# Patient Record
Sex: Male | Born: 1945 | Race: White | Hispanic: No | Marital: Married | Smoking: Never smoker
Health system: Southern US, Community
[De-identification: ages and names within clinical notes are randomized; demographics above are authoritative.]

## PROBLEM LIST (undated history)

## (undated) DIAGNOSIS — Z7189 Other specified counseling: Secondary | ICD-10-CM

## (undated) DIAGNOSIS — Z5111 Encounter for antineoplastic chemotherapy: Secondary | ICD-10-CM

## (undated) DIAGNOSIS — H409 Unspecified glaucoma: Secondary | ICD-10-CM

## (undated) DIAGNOSIS — R634 Abnormal weight loss: Secondary | ICD-10-CM

## (undated) DIAGNOSIS — N5089 Other specified disorders of the male genital organs: Secondary | ICD-10-CM

## (undated) DIAGNOSIS — G893 Neoplasm related pain (acute) (chronic): Secondary | ICD-10-CM

## (undated) DIAGNOSIS — C7951 Secondary malignant neoplasm of bone: Secondary | ICD-10-CM

## (undated) DIAGNOSIS — C3491 Malignant neoplasm of unspecified part of right bronchus or lung: Secondary | ICD-10-CM

## (undated) HISTORY — DX: Other specified counseling: Z71.89

## (undated) HISTORY — DX: Neoplasm related pain (acute) (chronic): G89.3

## (undated) HISTORY — DX: Malignant neoplasm of unspecified part of right bronchus or lung: C34.91

## (undated) HISTORY — DX: Secondary malignant neoplasm of bone: C79.51

## (undated) HISTORY — DX: Abnormal weight loss: R63.4

## (undated) HISTORY — PX: OTHER SURGICAL HISTORY: SHX169

## (undated) HISTORY — DX: Encounter for antineoplastic chemotherapy: Z51.11

---

## 1982-06-07 HISTORY — PX: HERNIA REPAIR: SHX51

## 1999-10-12 ENCOUNTER — Ambulatory Visit (HOSPITAL_COMMUNITY): Admission: RE | Admit: 1999-10-12 | Discharge: 1999-10-12 | Payer: Self-pay | Admitting: Family Medicine

## 1999-10-12 ENCOUNTER — Encounter: Payer: Self-pay | Admitting: Family Medicine

## 2003-11-14 ENCOUNTER — Ambulatory Visit (HOSPITAL_COMMUNITY): Admission: RE | Admit: 2003-11-14 | Discharge: 2003-11-14 | Payer: Self-pay | Admitting: Family Medicine

## 2014-05-17 ENCOUNTER — Ambulatory Visit: Payer: Medicare Other | Admitting: *Deleted

## 2014-05-17 DIAGNOSIS — R55 Syncope and collapse: Secondary | ICD-10-CM

## 2014-05-20 NOTE — Progress Notes (Signed)
30 day Event monitor place 05/17/14

## 2014-06-17 ENCOUNTER — Telehealth: Payer: Self-pay | Admitting: Cardiovascular Disease

## 2014-06-17 NOTE — Telephone Encounter (Signed)
Patient was referred here for 30 day event monitor.  Patient got monitor on December 11 and turned it in on December 26 because his insurance was not going to pay for it.  Dr. Doreene Adas office is calling to see if we got any results.  Patient has never been seen in our office.Marland KitchenMarland Kitchen

## 2014-06-17 NOTE — Telephone Encounter (Signed)
Spoke w/ nurse requesting information. This patient was put on a 30-day event monitor. I see the report states "in process". I'm unable to access Muse software to read events or diagnostic information. Will route to [place].

## 2014-06-18 ENCOUNTER — Encounter: Payer: Self-pay | Admitting: Cardiology

## 2014-06-18 NOTE — Telephone Encounter (Signed)
Will have Dr. Martinique read the EOS and then fax to Dr. Kenton Kingfisher @ [phone].

## 2014-06-18 NOTE — Telephone Encounter (Signed)
EOS requested form [employer] - faxed over. Will have Dr. Martinique read the summary report and fax to Dr. Kenton Kingfisher @ [phone].

## 2014-06-24 NOTE — Telephone Encounter (Signed)
Dr.Jordan reviewed and signed end of summary [employer] monitor report.Report faxed to Dr.Harris at fax # [phone].

## 2016-05-09 ENCOUNTER — Encounter (HOSPITAL_BASED_OUTPATIENT_CLINIC_OR_DEPARTMENT_OTHER): Payer: Self-pay | Admitting: *Deleted

## 2016-05-09 ENCOUNTER — Emergency Department (HOSPITAL_BASED_OUTPATIENT_CLINIC_OR_DEPARTMENT_OTHER)
Admission: EM | Admit: 2016-05-09 | Discharge: 2016-05-09 | Disposition: A | Discharge status: Home / Self Care | Payer: Medicare Other | Attending: Emergency Medicine | Admitting: Emergency Medicine

## 2016-05-09 DIAGNOSIS — Z79899 Other long term (current) drug therapy: Secondary | ICD-10-CM | POA: Insufficient documentation

## 2016-05-09 DIAGNOSIS — Z5181 Encounter for therapeutic drug level monitoring: Secondary | ICD-10-CM | POA: Insufficient documentation

## 2016-05-09 DIAGNOSIS — R262 Difficulty in walking, not elsewhere classified: Secondary | ICD-10-CM | POA: Insufficient documentation

## 2016-05-09 DIAGNOSIS — R42 Dizziness and giddiness: Secondary | ICD-10-CM

## 2016-05-09 HISTORY — DX: Unspecified glaucoma: H40.9

## 2016-05-09 HISTORY — DX: Other specified disorders of the male genital organs: N50.89

## 2016-05-09 LAB — COMPREHENSIVE METABOLIC PANEL
ALT: 16 U/L — AB (ref 17–63)
AST: 26 U/L (ref 15–41)
Albumin: 3.8 g/dL (ref 3.5–5.0)
Alkaline Phosphatase: 71 U/L (ref 38–126)
Anion gap: 6 (ref 5–15)
BILIRUBIN TOTAL: 0.7 mg/dL (ref 0.3–1.2)
BUN: 21 mg/dL — AB (ref 6–20)
CHLORIDE: 110 mmol/L (ref 101–111)
CO2: 25 mmol/L (ref 22–32)
CREATININE: 1.15 mg/dL (ref 0.61–1.24)
Calcium: 9.7 mg/dL (ref 8.9–10.3)
Glucose, Bld: 138 mg/dL — ABNORMAL HIGH (ref 65–99)
POTASSIUM: 4.3 mmol/L (ref 3.5–5.1)
Sodium: 141 mmol/L (ref 135–145)
TOTAL PROTEIN: 6.4 g/dL — AB (ref 6.5–8.1)

## 2016-05-09 LAB — CBC
HEMATOCRIT: 45.3 % (ref 39.0–52.0)
HEMOGLOBIN: 15.4 g/dL (ref 13.0–17.0)
MCH: 31.8 pg (ref 26.0–34.0)
MCHC: 34 g/dL (ref 30.0–36.0)
MCV: 93.6 fL (ref 78.0–100.0)
Platelets: 295 10*3/uL (ref 150–400)
RBC: 4.84 MIL/uL (ref 4.22–5.81)
RDW: 12.8 % (ref 11.5–15.5)
WBC: 12.8 10*3/uL — ABNORMAL HIGH (ref 4.0–10.5)

## 2016-05-09 LAB — RAPID URINE DRUG SCREEN, HOSP PERFORMED
Amphetamines: NOT DETECTED
Barbiturates: NOT DETECTED
Benzodiazepines: NOT DETECTED
COCAINE: NOT DETECTED
OPIATES: NOT DETECTED
Tetrahydrocannabinol: NOT DETECTED

## 2016-05-09 LAB — URINE MICROSCOPIC-ADD ON

## 2016-05-09 LAB — URINALYSIS, ROUTINE W REFLEX MICROSCOPIC
Bilirubin Urine: NEGATIVE
GLUCOSE, UA: NEGATIVE mg/dL
Hgb urine dipstick: NEGATIVE
Ketones, ur: 15 mg/dL — AB
NITRITE: NEGATIVE
PH: 7 (ref 5.0–8.0)
Protein, ur: NEGATIVE mg/dL
SPECIFIC GRAVITY, URINE: 1.017 (ref 1.005–1.030)

## 2016-05-09 LAB — PROTIME-INR
INR: 0.95
Prothrombin Time: 12.7 seconds (ref 11.4–15.2)

## 2016-05-09 LAB — DIFFERENTIAL
BASOS ABS: 0 10*3/uL (ref 0.0–0.1)
BASOS PCT: 0 %
EOS ABS: 0 10*3/uL (ref 0.0–0.7)
Eosinophils Relative: 0 %
LYMPHS ABS: 0.6 10*3/uL — AB (ref 0.7–4.0)
Lymphocytes Relative: 4 %
MONO ABS: 0.6 10*3/uL (ref 0.1–1.0)
MONOS PCT: 5 %
Neutro Abs: 11.6 10*3/uL — ABNORMAL HIGH (ref 1.7–7.7)
Neutrophils Relative %: 91 %

## 2016-05-09 LAB — TROPONIN I: Troponin I: <0.03 ng/mL (ref <0.03)

## 2016-05-09 LAB — ETHANOL

## 2016-05-09 LAB — APTT: aPTT: 29 seconds (ref 24–36)

## 2016-05-09 MED ORDER — DIAZEPAM 5 MG PO TABS: 5.0000 mg | ORAL_TABLET | Freq: Two times a day (BID) | ORAL | 0 refills | Status: DC

## 2016-05-09 MED ORDER — DIAZEPAM 5 MG/ML IJ SOLN
5.0000 mg | Freq: Once | INTRAMUSCULAR | Status: AC
  Administered 2016-05-09: 5 mg via INTRAVENOUS
  Filled 2016-05-09: qty 2

## 2016-05-09 NOTE — ED Triage Notes (Signed)
per wife, Warren Odonnell started experiencing dizziness around 3 pm yesterday. He took a dramamine around 9:30 pm which helped him sleep, but did not relieve the dizziness. He states that the room is still spinning around.

## 2016-05-09 NOTE — ED Notes (Signed)
Pt ambulated approx 559f in hallway. Denys any dizziness. States he feels weak, but has not eaten anything. Towards the end of ambulating, he began to lose his balance and stumbled. In NAD. PA made aware. MD to bedside.

## 2016-05-09 NOTE — Discharge Instructions (Signed)
Start taking Valium twice daily for dizziness and nausea. If your symptoms return or become worse please go to [facility] for evaluation with MRI and neurology.

## 2016-05-09 NOTE — ED Provider Notes (Signed)
[facility] Provider Note   CSN: [id number] Arrival date & time: 05/09/16  1010     History   Chief Complaint Chief Complaint  Patient presents with  . Dizziness    HPI Warren Odonnell is a 70 y.o. male.  HPI Patient with past medical history significant for glaucoma presents with gradual onset, constant, unchanging dizziness he describes as the room spinning. States this began about 1 PM yesterday and has been persistent. He cannot think of specific triggers such as head movement or standing. This is never happened before. He denies any recent illness. He denies any headache, neck pain, numbness, weakness, slurred speech, facial droop, chest pain, shortness of breath. He states that he is having to hold onto things as he walks. He took scopolamine without relief. He states he does go to a chiropractor for neck and back pain with the most recent visit being 3 days ago.  Past Medical History:  Diagnosis Date  . Glaucoma   . Testicular swelling     There are no active problems to display for this patient.   Past Surgical History:  Procedure Laterality Date  . HERNIA REPAIR  1984  . testicular torsion Left        Home Medications    Prior to Admission medications   Medication Sig Start Date End Date Taking? Authorizing Provider  MELOXICAM PO Take by mouth.   Yes Historical Provider, MD  Multiple Vitamin (MULTIVITAMIN) tablet Take 1 tablet by mouth daily.   Yes Historical Provider, MD  diazepam (VALIUM) 5 MG tablet Take 1 tablet (5 mg total) by mouth 2 (two) times daily. 05/09/16   Gloriann Loan, PA-C    Family History No family history on file.  Social History Social History  Substance Use Topics  . Smoking status: Not on file  . Smokeless tobacco: Not on file  . Alcohol use Not on file     Allergies   Patient has no allergy information on record.   Review of Systems Review of Systems All other systems negative unless otherwise stated in  HPI   Physical Exam Updated Vital Signs BP 145/77   Pulse 70   Temp 98.1 F (36.7 C) (Oral)   Resp 15   Ht '5\' 11"'$  (1.803 m)   Wt 81.6 kg   SpO2 97%   BMI 25.10 kg/m   Physical Exam  Constitutional: He is oriented to person, place, and time. He appears well-developed and well-nourished.  Non-toxic appearance. He does not have a sickly appearance. He does not appear ill.  HENT:  Head: Normocephalic and atraumatic.  Mouth/Throat: Oropharynx is clear and moist.  Nystagmus.   Eyes: Conjunctivae are normal. Pupils are equal, round, and reactive to light.  Neck: Normal range of motion. Neck supple.  Cardiovascular: Normal rate and regular rhythm.   Pulmonary/Chest: Effort normal and breath sounds normal. No accessory muscle usage or stridor. No respiratory distress. He has no wheezes. He has no rhonchi. He has no rales.  Abdominal: Soft. Bowel sounds are normal. He exhibits no distension. There is no tenderness.  Musculoskeletal: Normal range of motion.  Lymphadenopathy:    He has no cervical adenopathy.  Neurological: He is alert and oriented to person, place, and time.  Mental Status:   AOx3.  Speech clear without dysarthria. Cranial Nerves:  I-not tested  II-PERRLA  III, IV, VI-EOMs intact  V-temporal and masseter strength intact  VII-symmetrical facial movements intact, no facial droop  VIII-hearing grossly intact bilaterally  IX, X-gag intact  XI-strength of sternomastoid and trapezius muscles 5/5  XII-tongue midline Motor:   Good muscle bulk and tone  Strength 5/5 bilaterally in upper and lower extremities   Cerebellar--intact RAMs, finger to nose intact bilaterally.    No pronator drift Sensory:  Intact in upper and lower extremities   Skin: Skin is warm and dry.  Psychiatric: He has a normal mood and affect. His behavior is normal.     ED Treatments / Results  Labs (all labs ordered are listed, but only abnormal results are displayed) Labs Reviewed  CBC -  Abnormal; Notable for the following:       Result Value   WBC 12.8 (*)    All other components within normal limits  DIFFERENTIAL - Abnormal; Notable for the following:    Neutro Abs 11.6 (*)    Lymphs Abs 0.6 (*)    All other components within normal limits  COMPREHENSIVE METABOLIC PANEL - Abnormal; Notable for the following:    Glucose, Bld 138 (*)    BUN 21 (*)    Total Protein 6.4 (*)    ALT 16 (*)    All other components within normal limits  URINALYSIS, ROUTINE W REFLEX MICROSCOPIC (NOT AT [facility]) - Abnormal; Notable for the following:    Ketones, ur 15 (*)    Leukocytes, UA TRACE (*)    All other components within normal limits  URINE MICROSCOPIC-ADD ON - Abnormal; Notable for the following:    Squamous Epithelial / LPF 0-5 (*)    Bacteria, UA FEW (*)    Casts GRANULAR CAST (*)    All other components within normal limits  ETHANOL  PROTIME-INR  APTT  RAPID URINE DRUG SCREEN, HOSP PERFORMED  TROPONIN I    EKG  EKG Interpretation  Date/Time:  Sunday May 09 2016 10:36:43 EST Ventricular Rate:  70 PR Interval:    QRS Duration: 96 QT Interval:  413 QTC Calculation: 446 R Axis:   -11 Text Interpretation:  Sinus rhythm RSR' in V1 or V2, probably normal variant Baseline wander in lead(s) V3 Confirmed by HAVILAND MD, JULIE ([id number]) on 05/09/2016 10:38:47 AM       Radiology No results found.  Procedures Procedures (including critical care time)  Medications Ordered in ED Medications  diazepam (VALIUM) injection 5 mg (5 mg Intravenous Given 05/09/16 1123)     Initial Impression / Assessment and Plan / ED Course  I have reviewed the triage vital signs and the nursing notes.  Pertinent labs & imaging results that were available during my care of the patient were reviewed by me and considered in my medical decision making (see chart for details).  Clinical Course    Patient presents with gradual onset, constant, unchanging dizziness he describes as the room  spinning. He is difficulty walking. No other neurologic symptoms. On exam he does have nystagmus. Otherwise, no neurologic findings. Gait not tested at this time. Concern for posterior stroke versus peripheral vertigo. We'll treat with Valium and reassess. Labs without acute abnormalities.  Patient reports improvement of his symptoms and able to ambulate in ED.  At this time, I have low suspicion for posterior stroke given improvement of symptoms.  Patient seen by Dr. Gilford Raid as well.  Discussed outpatient MRI vs transfer [facility] for continued work up.  Patient would like to go home and have outpatient MRI, this has been scheduled.  This is reasonable given low suspicion.  Discharge home with #8 Valium. Follow up PCP.  Strict return precautions including worsening dizziness, nausea, vomiting, headache, or any new or concerning symptoms.  Patient instructed to go to [facility] as they have MRI available today.  Patient agrees and acknowledges the above plan for discharge.  All questions answered.  Stable for discharge.   Final Clinical Impressions(s) / ED Diagnoses   Final diagnoses:  Dizziness    New Prescriptions New Prescriptions   DIAZEPAM (VALIUM) 5 MG TABLET    Take 1 tablet (5 mg total) by mouth 2 (two) times daily.         Gloriann Loan, PA-C 05/09/16 1246    Isla Pence, MD 05/09/16 1434

## 2016-05-13 ENCOUNTER — Ambulatory Visit (HOSPITAL_BASED_OUTPATIENT_CLINIC_OR_DEPARTMENT_OTHER)
Admission: RE | Admit: 2016-05-13 | Discharge: 2016-05-13 | Disposition: A | Discharge status: Home / Self Care | Payer: Medicare Other | Source: Ambulatory Visit | Attending: Emergency Medicine | Admitting: Emergency Medicine

## 2016-05-13 DIAGNOSIS — R42 Dizziness and giddiness: Secondary | ICD-10-CM | POA: Insufficient documentation

## 2016-06-03 ENCOUNTER — Encounter (INDEPENDENT_AMBULATORY_CARE_PROVIDER_SITE_OTHER): Payer: Self-pay | Admitting: Orthopaedic Surgery

## 2016-06-03 ENCOUNTER — Ambulatory Visit (INDEPENDENT_AMBULATORY_CARE_PROVIDER_SITE_OTHER): Payer: Medicare Other

## 2016-06-03 ENCOUNTER — Ambulatory Visit (INDEPENDENT_AMBULATORY_CARE_PROVIDER_SITE_OTHER): Payer: Medicare Other | Admitting: Orthopaedic Surgery

## 2016-06-03 VITALS — Ht 71.0 in | Wt 180.0 lb

## 2016-06-03 DIAGNOSIS — M545 Low back pain: Secondary | ICD-10-CM | POA: Diagnosis not present

## 2016-06-03 DIAGNOSIS — G8929 Other chronic pain: Secondary | ICD-10-CM

## 2016-06-03 DIAGNOSIS — M542 Cervicalgia: Secondary | ICD-10-CM

## 2016-06-03 NOTE — Progress Notes (Signed)
   Office Visit Note   Patient: Warren Odonnell           Date of Birth: Apr 30, 1946           MRN: [id number] Visit Date: 06/03/2016              Requested by: Shirline Frees, MD [place] [place], [place] [address] PCP: Shirline Frees, MD   Assessment & Plan: Visit Diagnoses: Neck and lumbar spine pain. Appears to be a combination of mild muscle spasm and degenerative changes. There is no evidence of radicular pain or fracture.  Plan: Over-the-counter meds, physical therapy. Follow up in the next 3-4 weeks  Follow-Up Instructions: No Follow-up on file.   Orders:  No orders of the defined types were placed in this encounter.  No orders of the defined types were placed in this encounter.     Procedures: No procedures performed   Clinical Data: No additional findings.   Subjective: No chief complaint on file.   4 weeks ago pt had vertigo, went to [place] ED on 05/08/16.  Pt had light sensitivity, emesis and dizziness.Denies headache.  MRI brain 05/13/16, negative  BIL shoulders and neck pain. Also, pain in his lower back did not start until the vertigo and the vomiting.  Pt went to chiropractor for back pain from playing golf. 2 x a week, 10 visits. No relief.  Pt also had blood work done and it showed elevated white count.                 Warren Odonnell relates no radicular pain to either upper or lower extremity. Discomfort is localized to the cervical and lumbar spine. He denies any changes in bowel or bladder function. He has an appointment to see the ENT specialist tomorrow regarding his dizziness.  Review of Systems   Objective: Vital Signs: Ht '5\' 11"'$  (1.803 m)   Wt 180 lb (81.6 kg)   BMI 25.10 kg/m   Physical Exam  Ortho Exam mild limitation of motion of the cervical spine particularly in extension. There was no referred pain to either upper extremity or shoulder with Motion. Able to touch the chin to the chest. Limitation of  rotation to the right and the left related to posterior cervical pain.  Straight leg raise is negative bilaterally. Deep tendon reflexes are symmetrical. No percussible tenderness of lumbar spine. Painless range of motion of both hips and knees. No local tenderness about either hip.  Specialty Comments:  No specialty comments available.  Imaging: No results found.   PMFS History: There are no active problems to display for this patient.  Past Medical History:  Diagnosis Date  . Glaucoma   . Testicular swelling     No family history on file.  Past Surgical History:  Procedure Laterality Date  . HERNIA REPAIR  1984  . testicular torsion Left    Social History   Occupational History  . Not on file.   Social History Main Topics  . Smoking status: Not on file  . Smokeless tobacco: Not on file  . Alcohol use Not on file  . Drug use: Unknown  . Sexual activity: Not on file

## 2016-06-22 ENCOUNTER — Telehealth (INDEPENDENT_AMBULATORY_CARE_PROVIDER_SITE_OTHER): Payer: Self-pay | Admitting: Orthopaedic Surgery

## 2016-06-22 ENCOUNTER — Other Ambulatory Visit (INDEPENDENT_AMBULATORY_CARE_PROVIDER_SITE_OTHER): Payer: Self-pay

## 2016-06-22 DIAGNOSIS — G8929 Other chronic pain: Secondary | ICD-10-CM

## 2016-06-22 DIAGNOSIS — M545 Low back pain: Principal | ICD-10-CM

## 2016-06-22 MED ORDER — METHOCARBAMOL 500 MG PO TABS: 500.0000 mg | ORAL_TABLET | Freq: Two times a day (BID) | ORAL | 0 refills | Status: DC

## 2016-06-22 NOTE — Telephone Encounter (Signed)
Patient was seen in office for back pain; Dr. Tanda Rockers was hesitant to give him muscle relaxers because of his vertigo. Patient has been to an ENT and has been cleared of that problem. Patient is still having back pain and would like an rx for some muscle relaxers sent to [facility] at [facility] [address].

## 2016-06-22 NOTE — Telephone Encounter (Signed)
Please advise 

## 2016-06-22 NOTE — Telephone Encounter (Signed)
done

## 2016-06-22 NOTE — Telephone Encounter (Signed)
Robaxin '500mg'$  #30 1 tab po bid prn

## 2016-06-29 ENCOUNTER — Ambulatory Visit (INDEPENDENT_AMBULATORY_CARE_PROVIDER_SITE_OTHER): Payer: Medicare Other | Admitting: Orthopaedic Surgery

## 2016-06-29 ENCOUNTER — Encounter (INDEPENDENT_AMBULATORY_CARE_PROVIDER_SITE_OTHER): Payer: Self-pay | Admitting: Orthopaedic Surgery

## 2016-06-29 VITALS — BP 162/78 | HR 80 | Resp 14 | Ht 71.0 in | Wt 180.0 lb

## 2016-06-29 DIAGNOSIS — M545 Low back pain: Secondary | ICD-10-CM

## 2016-06-29 DIAGNOSIS — G8929 Other chronic pain: Secondary | ICD-10-CM | POA: Diagnosis not present

## 2016-06-29 MED ORDER — TRAMADOL HCL 50 MG PO TABS: 50.0000 mg | ORAL_TABLET | Freq: Three times a day (TID) | ORAL | 0 refills | Status: DC

## 2016-06-29 NOTE — Progress Notes (Signed)
   Office Visit Note   Patient: Warren Odonnell           Date of Birth: 05-Jun-1946           MRN: [id number] Visit Date: 06/29/2016              Requested by: Shirline Frees, MD [place], [place] [address] PCP: Shirline Frees, MD   Assessment & Plan: Visit Diagnoses: low back pain  Plan: MRI scan lumbar spine and follow up after scan. Tramadol for pain to supplement the muscle relaxant and NSAIDs  Follow-Up Instructions: No Follow-up on file.   Orders:  No orders of the defined types were placed in this encounter.  No orders of the defined types were placed in this encounter.     Procedures: No procedures performed   Clinical Data: No additional findings.   Subjective: No chief complaint on file.   Pt presents with Left sided lumbar pain that does not radiate into the hamstrings/quad area., has become increasingly worse in the last 2 weeks. Pt had a cold and did a lot of sneezing and coughing.  PT is not helping according to the pt. He wants some relief and some meds.  We did prescribe Robaxin on 06/22/16 but has had no relief from that med.  Warren Odonnell relates that he has not had any significant relief of his pain that originates in the lower lumbar spine and will radiate both right and left paralumbar regions. Denies any groin pain or thigh discomfort. He denies any numbness or tingling.  Review of Systems   Objective: Vital Signs: Ht '5\' 11"'$  (1.803 m)   Wt 180 lb (81.6 kg)   BMI 25.10 kg/m   Physical Exam  Ortho Exam straight leg raise is negative bilaterally. Painless range of motion of both hips and both knees. Neurovascular exam is intact. No swelling of either calf or thigh. Some mild percussible tenderness of the lumbar spine and to the left of the midline. No masses. Over the greater trochanters.  Specialty Comments:  No specialty comments available.  Imaging: No results found.   PMFS History: There are no active  problems to display for this patient.  Past Medical History:  Diagnosis Date  . Glaucoma   . Testicular swelling     No family history on file.  Past Surgical History:  Procedure Laterality Date  . HERNIA REPAIR  1984  . testicular torsion Left    Social History   Occupational History  . Not on file.   Social History Main Topics  . Smoking status: Never Smoker  . Smokeless tobacco: Never Used  . Alcohol use Not on file  . Drug use: No  . Sexual activity: Not Currently

## 2016-07-02 ENCOUNTER — Other Ambulatory Visit (INDEPENDENT_AMBULATORY_CARE_PROVIDER_SITE_OTHER): Payer: Self-pay

## 2016-07-02 ENCOUNTER — Telehealth (INDEPENDENT_AMBULATORY_CARE_PROVIDER_SITE_OTHER): Payer: Self-pay | Admitting: Orthopaedic Surgery

## 2016-07-02 DIAGNOSIS — G8929 Other chronic pain: Secondary | ICD-10-CM

## 2016-07-02 DIAGNOSIS — M545 Low back pain: Principal | ICD-10-CM

## 2016-07-02 MED ORDER — TRAMADOL HCL 50 MG PO TABS: ORAL_TABLET | ORAL | 0 refills | Status: DC

## 2016-07-02 NOTE — Telephone Encounter (Signed)
Please call.

## 2016-07-02 NOTE — Telephone Encounter (Signed)
Patient states Dr. Durward Fortes is supposed to call on Monday and is requesting he call him on his cell phone please

## 2016-07-02 NOTE — Telephone Encounter (Signed)
called

## 2016-07-03 ENCOUNTER — Ambulatory Visit (HOSPITAL_BASED_OUTPATIENT_CLINIC_OR_DEPARTMENT_OTHER)
Admission: RE | Admit: 2016-07-03 | Discharge: 2016-07-03 | Disposition: A | Discharge status: Home / Self Care | Payer: Medicare Other | Source: Ambulatory Visit | Attending: Orthopaedic Surgery | Admitting: Orthopaedic Surgery

## 2016-07-03 DIAGNOSIS — M5126 Other intervertebral disc displacement, lumbar region: Secondary | ICD-10-CM | POA: Diagnosis not present

## 2016-07-03 DIAGNOSIS — M1288 Other specific arthropathies, not elsewhere classified, other specified site: Secondary | ICD-10-CM | POA: Diagnosis not present

## 2016-07-03 DIAGNOSIS — M5136 Other intervertebral disc degeneration, lumbar region: Secondary | ICD-10-CM | POA: Diagnosis not present

## 2016-07-03 DIAGNOSIS — N281 Cyst of kidney, acquired: Secondary | ICD-10-CM | POA: Insufficient documentation

## 2016-07-03 DIAGNOSIS — G8929 Other chronic pain: Secondary | ICD-10-CM | POA: Diagnosis not present

## 2016-07-03 DIAGNOSIS — M47896 Other spondylosis, lumbar region: Secondary | ICD-10-CM | POA: Diagnosis not present

## 2016-07-03 DIAGNOSIS — M545 Low back pain: Secondary | ICD-10-CM | POA: Insufficient documentation

## 2016-07-03 DIAGNOSIS — M48061 Spinal stenosis, lumbar region without neurogenic claudication: Secondary | ICD-10-CM | POA: Insufficient documentation

## 2016-07-05 ENCOUNTER — Telehealth (INDEPENDENT_AMBULATORY_CARE_PROVIDER_SITE_OTHER): Payer: Self-pay

## 2016-07-05 NOTE — Telephone Encounter (Signed)
Received voicemail on Friday from pts wife stating pt needed appt this week for Mri  Review with Dr. Ernestina Patches (MRI 07/03/16) and needing to get set up for injection asap after that. I see where Dr. Durward Fortes said he called him on Friday. Are we supposed to be setting him for injection pending MRI results or are we supposed to be seeing him to go over the MRI and then setting him up?

## 2016-07-05 NOTE — Telephone Encounter (Signed)
See below

## 2016-07-05 NOTE — Telephone Encounter (Signed)
Called Dr Kenton Kingfisher regarding the MRI scan report-he will call Mr Ulibarri and obtain lab thru his office. Called Mr Davidian with the  results of the scan- no need to see Dr Ernestina Patches at this time

## 2016-07-05 NOTE — Telephone Encounter (Signed)
Thank you :)

## 2016-07-09 ENCOUNTER — Ambulatory Visit (INDEPENDENT_AMBULATORY_CARE_PROVIDER_SITE_OTHER): Payer: Medicare Other | Admitting: Orthopaedic Surgery

## 2016-07-14 ENCOUNTER — Other Ambulatory Visit: Payer: Self-pay | Admitting: Family Medicine

## 2016-07-14 DIAGNOSIS — M899 Disorder of bone, unspecified: Secondary | ICD-10-CM

## 2016-07-15 ENCOUNTER — Ambulatory Visit
Admission: RE | Admit: 2016-07-15 | Discharge: 2016-07-15 | Disposition: A | Discharge status: Home / Self Care | Payer: Medicare Other | Source: Ambulatory Visit | Attending: Family Medicine | Admitting: Family Medicine

## 2016-07-15 DIAGNOSIS — M899 Disorder of bone, unspecified: Secondary | ICD-10-CM

## 2016-07-15 MED ORDER — IOPAMIDOL (ISOVUE-300) INJECTION 61%
100.0000 mL | Freq: Once | INTRAVENOUS | Status: AC | PRN
  Administered 2016-07-15: 100 mL via INTRAVENOUS

## 2016-07-19 ENCOUNTER — Telehealth: Payer: Self-pay | Admitting: *Deleted

## 2016-07-19 DIAGNOSIS — R918 Other nonspecific abnormal finding of lung field: Secondary | ICD-10-CM

## 2016-07-19 NOTE — Telephone Encounter (Signed)
Oncology Nurse Navigator Documentation  Oncology Nurse Navigator Flowsheets 07/19/2016  Navigator Location [facility]  Referral date to RadOnc/MedOnc 07/19/2016  Navigator Encounter Type Telephone/I received referral on Warren Odonnell today.  I updated Dr. Julien Nordmann and he state he will see patient on Friday.  I called patient and update.  Patient will be seen on 07/23/16 arrive at 8:15. I called referring office to update them on appt. I was unable to reach but did leave a vm message   Telephone Outgoing Call  Treatment Phase Abnormal Scans  Barriers/Navigation Needs Coordination of Care  Interventions Coordination of Care  Coordination of Care Appts  Acuity Level 2  Acuity Level 2 Other  Time Spent with Patient 30

## 2016-07-23 ENCOUNTER — Encounter: Payer: Self-pay | Admitting: *Deleted

## 2016-07-23 ENCOUNTER — Telehealth: Payer: Self-pay | Admitting: *Deleted

## 2016-07-23 ENCOUNTER — Telehealth: Payer: Self-pay | Admitting: Internal Medicine

## 2016-07-23 ENCOUNTER — Ambulatory Visit (HOSPITAL_BASED_OUTPATIENT_CLINIC_OR_DEPARTMENT_OTHER): Payer: Medicare Other | Admitting: Internal Medicine

## 2016-07-23 ENCOUNTER — Encounter: Payer: Self-pay | Admitting: Internal Medicine

## 2016-07-23 ENCOUNTER — Other Ambulatory Visit: Payer: Self-pay | Admitting: Internal Medicine

## 2016-07-23 ENCOUNTER — Other Ambulatory Visit (HOSPITAL_BASED_OUTPATIENT_CLINIC_OR_DEPARTMENT_OTHER): Payer: Medicare Other

## 2016-07-23 VITALS — BP 170/85 | HR 104 | Temp 98.1°F | Resp 17 | Ht 71.0 in | Wt 171.2 lb

## 2016-07-23 DIAGNOSIS — R918 Other nonspecific abnormal finding of lung field: Secondary | ICD-10-CM

## 2016-07-23 DIAGNOSIS — C7951 Secondary malignant neoplasm of bone: Secondary | ICD-10-CM

## 2016-07-23 DIAGNOSIS — G893 Neoplasm related pain (acute) (chronic): Secondary | ICD-10-CM

## 2016-07-23 DIAGNOSIS — J90 Pleural effusion, not elsewhere classified: Secondary | ICD-10-CM

## 2016-07-23 DIAGNOSIS — R599 Enlarged lymph nodes, unspecified: Secondary | ICD-10-CM

## 2016-07-23 DIAGNOSIS — R110 Nausea: Secondary | ICD-10-CM

## 2016-07-23 DIAGNOSIS — R634 Abnormal weight loss: Secondary | ICD-10-CM | POA: Diagnosis not present

## 2016-07-23 HISTORY — DX: Secondary malignant neoplasm of bone: C79.51

## 2016-07-23 LAB — COMPREHENSIVE METABOLIC PANEL
ALT: 48 U/L (ref 0–55)
AST: 32 U/L (ref 5–34)
Albumin: 2.9 g/dL — ABNORMAL LOW (ref 3.5–5.0)
Alkaline Phosphatase: 121 U/L (ref 40–150)
Anion Gap: 11 mEq/L (ref 3–11)
BUN: 23 mg/dL (ref 7.0–26.0)
CHLORIDE: 105 meq/L (ref 98–109)
CO2: 22 meq/L (ref 22–29)
Calcium: 10.3 mg/dL (ref 8.4–10.4)
Creatinine: 1 mg/dL (ref 0.7–1.3)
EGFR: 73 mL/min/{1.73_m2} — AB (ref >90)
GLUCOSE: 131 mg/dL (ref 70–140)
POTASSIUM: 3.7 meq/L (ref 3.5–5.1)
SODIUM: 139 meq/L (ref 136–145)
Total Bilirubin: 0.65 mg/dL (ref 0.20–1.20)
Total Protein: 6.7 g/dL (ref 6.4–8.3)

## 2016-07-23 LAB — CBC WITH DIFFERENTIAL/PLATELET
BASO%: 0.9 % (ref 0.0–2.0)
Basophils Absolute: 0.1 10*3/uL (ref 0.0–0.1)
EOS ABS: 0.1 10*3/uL (ref 0.0–0.5)
EOS%: 0.9 % (ref 0.0–7.0)
HCT: 35.8 % — ABNORMAL LOW (ref 38.4–49.9)
HGB: 12.3 g/dL — ABNORMAL LOW (ref 13.0–17.1)
LYMPH%: 8.5 % — AB (ref 14.0–49.0)
MCH: 31.5 pg (ref 27.2–33.4)
MCHC: 34.3 g/dL (ref 32.0–36.0)
MCV: 91.6 fL (ref 79.3–98.0)
MONO#: 1.3 10*3/uL — AB (ref 0.1–0.9)
MONO%: 11.1 % (ref 0.0–14.0)
NEUT#: 8.9 10*3/uL — ABNORMAL HIGH (ref 1.5–6.5)
NEUT%: 78.6 % — AB (ref 39.0–75.0)
PLATELETS: 503 10*3/uL — AB (ref 140–400)
RBC: 3.9 10*6/uL — AB (ref 4.20–5.82)
RDW: 13.6 % (ref 11.0–14.6)
WBC: 11.4 10*3/uL — AB (ref 4.0–10.3)
lymph#: 1 10*3/uL (ref 0.9–3.3)

## 2016-07-23 MED ORDER — DEXAMETHASONE 4 MG PO TABS: 4.0000 mg | ORAL_TABLET | Freq: Three times a day (TID) | ORAL | 0 refills | Status: DC

## 2016-07-23 MED ORDER — FLUCONAZOLE 100 MG PO TABS: 100.0000 mg | ORAL_TABLET | Freq: Every day | ORAL | 0 refills | Status: DC

## 2016-07-23 MED ORDER — PROCHLORPERAZINE MALEATE 10 MG PO TABS: 10.0000 mg | ORAL_TABLET | Freq: Four times a day (QID) | ORAL | 0 refills | Status: DC | PRN

## 2016-07-23 MED ORDER — MORPHINE SULFATE ER 30 MG PO TBCR: 30.0000 mg | EXTENDED_RELEASE_TABLET | Freq: Two times a day (BID) | ORAL | 0 refills | Status: DC

## 2016-07-23 NOTE — Telephone Encounter (Signed)
Rad/Onc consultation and follow up with labs for Dr Julien Nordmann was scheduled for 08/10/16, per 07/23/16 los. Advised patient that he will receive a call from IR regarding his MRI, PET ans CT Guided Biopsy appointments,. Patient was given a copy of the AVS report and appointment schedule per 07/23/16 los.

## 2016-07-23 NOTE — Progress Notes (Signed)
[place] [phone]   [phone]  CONSULT NOTE  REFERRING PHYSICIAN: Dr. Shirline Frees  REASON FOR CONSULTATION:  71 years old white male with highly suspicious lung cancer.  HPI Warren Odonnell is a 71 y.o. male a never smoker with no significant past medical history except for glaucoma. The patient was very active and playing golf at regular basis until a few months ago. He mentioned that on November 2017 while he was playing golf in the had some pain between the shoulder blades. He continues to play but has more pain as he plays and he was not able to play more than 3 holes at a time. His back pain was getting worse and the patient was seen by chiropractor with no improvement. He was seen by orthopedic surgeon Dr. Garnette Czech and MRI of the lumbar spine without contrast was performed on 07/03/2016 and it showed scattered expansile likely lytic lesions in the lumbar vertebrae, sacrum and iliac bones compatible with either multiple myeloma or extensive osseous metastatic disease. An expansile bony lesion with possible epidural involvement along the left procedure in detail 1 vertebral body extending into the neural foramen and contributes to the left foraminal stenosis at L1-2. The patient was seen by his primary care physician Dr. Kenton Kingfisher and CT scan of the chest, abdomen and pelvis were performed on 07/15/2016 and it showed solid mass in the right lower lobe measuring 2.3 x 3.3 cm. There was also mediastinal lymphadenopathy including a node just anterior to the carina eccentric to the right and measured 1.6 x 2.6 cm. There was right hilar lymph node measuring 2.6 x 2.2 cm. There was also multiple lytic lesions are identified in the thoracic spine, largest lesion is in T6 with a pathologic compression fracture deformity with some bony retropulsion. There was also lytic lesions identified and is scattered drips including the posterior arc of the right fifth  rib. Dr. Kenton Kingfisher kindly referred the patient to me today for further evaluation and recommendation regarding treatment of his condition. When seen today the patient continues to complain of pain in the lower back as well as the right shoulder. He is currently on hydrocodone 20 mg by mouth every 6 hours with little relief of his pain. He also has chest pain and cough productive of yellowish sputum as well as shortness of breath but no hemoptysis. He lost around 10 pounds in the last few weeks. He has no headache but has visual changes with blurred vision. He denied having any fever or chills. He is complaining of nausea and occasional vomiting matter with cough. Family history significant for mother with basal cell carcinoma of the skin, father died at age 79 from heart attack and Sr. had stroke. The patient is married and has 2 daughters. He was accompanied today by his wife Warren Odonnell, daughters Warren Odonnell and Warren Odonnell as well as son-in-law Warren Odonnell. The patient used to work as Barista but currently retired. He has no history of smoking and drinks alcohol occasionally with no history of drug abuse.  HPI  Past Medical History:  Diagnosis Date  . Bone metastases ([place]) 07/23/2016  . Glaucoma   . Testicular swelling     Past Surgical History:  Procedure Laterality Date  . HERNIA REPAIR  1984  . testicular torsion Left     History reviewed. No pertinent family history.  Social History Social History  Substance Use Topics  . Smoking status: Never Smoker  . Smokeless tobacco: Never Used  .  Alcohol use No    No Known Allergies  Current Outpatient Prescriptions  Medication Sig Dispense Refill  . aspirin 81 MG chewable tablet Chew 81 mg by mouth.    . bimatoprost (LUMIGAN) 0.03 % ophthalmic solution 1 drop nightly.    . dorzolamide (TRUSOPT) 2 % ophthalmic solution     . esomeprazole (NEXIUM) 20 MG capsule Take 20 mg by mouth daily at 12 noon.    Marland Kitchen HYDROcodone-acetaminophen (NORCO)  10-325 MG tablet 2 tablets every 6 (six) hours as needed.     . MELOXICAM PO Take by mouth.    . Multiple Vitamin (MULTIVITAMIN) tablet Take 1 tablet by mouth daily.    . ondansetron (ZOFRAN) 8 MG tablet Take 8 mg by mouth every 8 (eight) hours as needed for nausea or vomiting.    . sildenafil (REVATIO) 20 MG tablet TAKE 2-5 TABS BY MOUTH ONCE DAILY AS NEEDED  3  . triamcinolone cream (KENALOG) 0.1 %     . dexamethasone (DECADRON) 4 MG tablet Take 1 tablet (4 mg total) by mouth 3 (three) times daily. 45 tablet 0  . fluconazole (DIFLUCAN) 100 MG tablet Take 1 tablet (100 mg total) by mouth daily. 10 tablet 0  . morphine (MS CONTIN) 30 MG 12 hr tablet Take 1 tablet (30 mg total) by mouth every 12 (twelve) hours. 60 tablet 0  . prochlorperazine (COMPAZINE) 10 MG tablet Take 1 tablet (10 mg total) by mouth every 6 (six) hours as needed for nausea or vomiting. 30 tablet 0   No current facility-administered medications for this visit.     Review of Systems  Constitutional: positive for anorexia, fatigue and weight loss Eyes: positive for visual disturbance Ears, nose, mouth, throat, and face: negative Respiratory: positive for cough, dyspnea on exertion, pleurisy/chest pain and sputum Cardiovascular: negative Gastrointestinal: positive for nausea and vomiting Genitourinary:negative Integument/breast: negative Hematologic/lymphatic: negative Musculoskeletal:positive for back pain and bone pain Neurological: negative Behavioral/Psych: negative Endocrine: negative Allergic/Immunologic: negative  Physical Exam  VWU:[id number], healthy, no distress, well nourished, well developed and anxious SKIN: skin color, texture, turgor are normal, no rashes or significant lesions HEAD: Normocephalic, No masses, lesions, tenderness or abnormalities EYES: normal, PERRLA, Conjunctiva are pink and non-injected EARS: External ears normal, Canals clear OROPHARYNX:no exudate, no erythema and lips, buccal mucosa,  and tongue normal  NECK: supple, no adenopathy, no JVD LYMPH:  no palpable lymphadenopathy, no hepatosplenomegaly LUNGS: decreased breath sounds HEART: regular rate & rhythm, no murmurs and no gallops ABDOMEN:abdomen soft, non-tender, normal bowel sounds and no masses or organomegaly BACK: Back symmetric, no curvature., No CVA tenderness EXTREMITIES:no joint deformities, effusion, or inflammation, no edema, no skin discoloration  NEURO: alert & oriented x 3 with fluent speech, no focal motor/sensory deficits  PERFORMANCE STATUS: ECOG 1  LABORATORY DATA: Lab Results  Component Value Date   WBC 11.4 (H) 07/23/2016   HGB 12.3 (L) 07/23/2016   HCT 35.8 (L) 07/23/2016   MCV 91.6 07/23/2016   PLT 503 (H) 07/23/2016      Chemistry      Component Value Date/Time   NA 139 07/23/2016 0854   K 3.7 07/23/2016 0854   CL 110 05/09/2016 1117   CO2 22 07/23/2016 0854   BUN 23.0 07/23/2016 0854   CREATININE 1.0 07/23/2016 0854      Component Value Date/Time   CALCIUM 10.3 07/23/2016 0854   ALKPHOS 121 07/23/2016 0854   AST 32 07/23/2016 0854   ALT 48 07/23/2016 0854   BILITOT 0.65 07/23/2016  0854       RADIOGRAPHIC STUDIES: Ct Chest W Contrast  Result Date: 07/15/2016 CLINICAL DATA:  Low back and left hip pain. Multiple bony lesions most consistent with metastatic disease or myeloma by prior MRI. EXAM: CT CHEST, ABDOMEN, AND PELVIS WITH CONTRAST TECHNIQUE: Multidetector CT imaging of the chest, abdomen and pelvis was performed following the standard protocol during bolus administration of intravenous contrast. CONTRAST:  100 ml ISOVUE-300 IOPAMIDOL (ISOVUE-300) INJECTION 61% COMPARISON:  None. MRI lumbar spine 07/03/2016. FINDINGS: CT CHEST FINDINGS Cardiovascular: Heart size normal. No pericardial effusion. Calcific aortic atherosclerosis noted. Mediastinum/Nodes: There is mediastinal lymphadenopathy. Node just anterior to the carina eccentric to the right on image 42 measures 1.6 cm by  2.6 cm. Right hilar lymph node on image 48 measures 2.6 by 2.2 cm. Lungs/Pleura: Small right pleural effusion is identified. There is mild thickening and nodularity of the superior aspect of the pleura along right major fissure. No discrete pleural nodule is identified. Solid mass in the right lower lobe on image 80 measures 2.3 x 3.3 cm. Mild dependent atelectasis is seen on the left. Musculoskeletal: Multiple lytic lesions are identified in the thoracic spine. Largest lesion is in T6 where there is a pathologic compression fracture deformity with some bony retropulsion. No obvious stenosis. Lytic lesions are also identified in scattered ribs, see for example the posterior arc of the right fifth rib on image 29. CT ABDOMEN PELVIS FINDINGS Hepatobiliary: No focal liver abnormality is seen. No gallstones, gallbladder wall thickening, or biliary dilatation. Pancreas: Unremarkable. No pancreatic ductal dilatation or surrounding inflammatory changes. Spleen: Normal in size without focal abnormality. Adrenals/Urinary Tract: Parapelvic renal cysts are seen and more prominent on the left. No solid renal lesion is identified. Ureters and urinary bladder appear normal. The adrenal glands are normal in appearance. Stomach/Bowel: Sigmoid diverticulosis without diverticulitis is noted. The colon is otherwise unremarkable. Stomach, small bowel and appendix appear normal. Vascular/Lymphatic: No significant vascular findings are present. No enlarged abdominal or pelvic lymph nodes. Reproductive: Prostate is unremarkable. Other: No abdominal wall hernia or abnormality. No abdominopelvic ascites. 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 Large lytic lesions are also seen in the posterior right ilium and sacrum. No fracture. IMPRESSION: Findings most consistent with bronchogenic carcinoma in the right lower lobe with associated  mediastinal lymphadenopathy, extensive skeletal metastases and small right pleural effusion. Pathologic T6 fracture with bony retropulsion but no obvious central canal stenosis. Evaluation for epidural tumor is limited on CT scan. If the patient has myelopathic symptoms, MRI of the thoracic spine with and without contrast could be used for further evaluation. Calcific aortic and coronary atherosclerosis. Sigmoid diverticulosis without diverticulitis. Electronically Signed   By: Inge Rise M.D.   On: 07/15/2016 10:36   Mr Lumbar Spine W/o Contrast  Result Date: 07/03/2016 CLINICAL DATA:  Low back and left hip pain for the last 6 weeks. EXAM: MRI LUMBAR SPINE WITHOUT CONTRAST TECHNIQUE: Multiplanar, multisequence MR imaging of the lumbar spine was performed. No intravenous contrast was administered. COMPARISON:  06/03/2016 radiographs FINDINGS: Segmentation: The lowest lumbar type non-rib-bearing vertebra is labeled as L5. Alignment:  No vertebral subluxation is observed. Vertebrae: Low T1 signal intensity lesions scattered in the lumbar spine suspicious for osseous metastatic disease. Among these is a 2.7 by 2.0 cm lesion along the left posterior inferior endplate of L1 which significantly expands the vertebral body posteriorly into the left neural foramen,  and may be associated with local epidural tumor. This is accompanied by a 2.6 by 1.9 cm lesion in the spinous process at this Odonnell and other metastatic lesions also in the vertebral body. The similar lesion is are present in the L2 vertebral body, and in the L3 spinous process, in the L5 vertebral body and spinous process, and along the upper S1 margin. Several other smaller scattered lesions are also present. Neoplastic lesions with low T1 and high T2 signal are present in both iliac bones. Conus medullaris: Extends to the L1 Odonnell and appears normal. Paraspinal and other soft tissues: No retroperitoneal adenopathy identified. Parapelvic renal cysts.  Disc levels: T12-L1: Unremarkable. L1-2: The bony vertebral expansion/ tumor along the left posterior vertebral body is questionably extending into the epidural space and causes mild left foraminal stenosis and mild displacement of the left L2 nerves in the lateral extraforaminal space. Expansile lytic lesion of the posterior elements. L2-3: Mild right foraminal stenosis and mild displacement of the left L2 nerve in the lateral extraforaminal space due to left lateral extraforaminal disc protrusion along with facet arthropathy and disc bulge. L3-4: Mild displacement of the right L3 nerve in the lateral extraforaminal space due to right lateral extraforaminal disc protrusion. Mild disc bulge. L4-5:  No impingement.  Mild disc bulge. L5-S1:  Unremarkable. IMPRESSION: 1. There are scattered expansile likely lytic lesions in the lumbar vertebra, sacrum, and iliac bones compatible with either multiple myeloma or extensive osseous metastatic disease. An expansile bony lesion with possible epidural involvement along the left posterior L1 vertebral body extends into the neural foramen and contributes to the left foraminal stenosis at L1- 2. Further workup for malignancy is recommended. 2. Lumbar spondylosis and degenerative disc disease also cause mild impingement at L2- 3 and L3-4. These results will be called to the ordering clinician or representative by the Radiologist Assistant, and communication documented in the PACS or zVision Dashboard. Electronically Signed   By: Van Clines M.D.   On: 07/03/2016 13:52   Ct Abdomen Pelvis W Contrast  Result Date: 07/15/2016 CLINICAL DATA:  Low back and left hip pain. Multiple bony lesions most consistent with metastatic disease or myeloma by prior MRI. EXAM: CT CHEST, ABDOMEN, AND PELVIS WITH CONTRAST TECHNIQUE: Multidetector CT imaging of the chest, abdomen and pelvis was performed following the standard protocol during bolus administration of intravenous contrast.  CONTRAST:  100 ml ISOVUE-300 IOPAMIDOL (ISOVUE-300) INJECTION 61% COMPARISON:  None. MRI lumbar spine 07/03/2016. FINDINGS: CT CHEST FINDINGS Cardiovascular: Heart size normal. No pericardial effusion. Calcific aortic atherosclerosis noted. Mediastinum/Nodes: There is mediastinal lymphadenopathy. Node just anterior to the carina eccentric to the right on image 42 measures 1.6 cm by 2.6 cm. Right hilar lymph node on image 48 measures 2.6 by 2.2 cm. Lungs/Pleura: Small right pleural effusion is identified. There is mild thickening and nodularity of the superior aspect of the pleura along right major fissure. No discrete pleural nodule is identified. Solid mass in the right lower lobe on image 80 measures 2.3 x 3.3 cm. Mild dependent atelectasis is seen on the left. Musculoskeletal: Multiple lytic lesions are identified in the thoracic spine. Largest lesion is in T6 where there is a pathologic compression fracture deformity with some bony retropulsion. No obvious stenosis. Lytic lesions are also identified in scattered ribs, see for example the posterior arc of the right fifth rib on image 29. CT ABDOMEN PELVIS FINDINGS Hepatobiliary: No focal liver abnormality is seen. No gallstones, gallbladder wall thickening, or biliary dilatation. Pancreas: Unremarkable.  No pancreatic ductal dilatation or surrounding inflammatory changes. Spleen: Normal in size without focal abnormality. Adrenals/Urinary Tract: Parapelvic renal cysts are seen and more prominent on the left. No solid renal lesion is identified. Ureters and urinary bladder appear normal. The adrenal glands are normal in appearance. Stomach/Bowel: Sigmoid diverticulosis without diverticulitis is noted. The colon is otherwise unremarkable. Stomach, small bowel and appendix appear normal. Vascular/Lymphatic: No significant vascular findings are present. No enlarged abdominal or pelvic lymph nodes. Reproductive: Prostate is unremarkable. Other: No abdominal wall hernia  or abnormality. No abdominopelvic ascites. 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 Large lytic lesions are also seen in the posterior right ilium and sacrum. No fracture. 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 Pathologic T6 fracture with bony retropulsion but no obvious central canal stenosis. Evaluation for epidural tumor is limited on CT scan. If the patient has myelopathic symptoms, MRI of the thoracic spine with and without contrast could be used for further evaluation. Calcific aortic and coronary atherosclerosis. Sigmoid diverticulosis without diverticulitis. Electronically Signed   By: Inge Rise M.D.   On: 07/15/2016 10:36    ASSESSMENT: This is a very pleasant 71 years old white male with highly suspicious for stage IV (T2a, N2, M1 B) non-small cell lung cancer probably adenocarcinoma in a patient with never smoking history, presented with right lower lobe lung mass in addition to small right pleural effusion, mediastinal lymphadenopathy and multiple metastatic bone disease. The patient continues to have moderate to severe pain in the lower back as well as the right shoulder area.   PLAN: I had a lengthy discussion with the patient and his family today about his current disease status and prognosis and treatment options. I personally and independently reviewed the scan images and discuss the results and showed the images to the patient and his family. I recommended for the patient to have CT-guided core biopsy of the right lower lobe lung mass by interventional radiology for confirmation of the diagnosis and also to have enough material for molecular studies. I will also complete the staging workup by ordering a PET scan as well as MRI  of the brain to rule out brain metastasis. For the significant back pain, I referred the patient to have MRI of the thoracic spine to rule out any cord compression from the T6 lesion. I also started the patient on MS Contin 30 mg by mouth Q 12 hours in addition he will continue on hydrocodone for breakthrough pain. I also started the patient on Decadron 4 mg every 8 hours for pain management and also for the weight loss and lack of appetite. I also referred the patient to radiation oncology for evaluation and consideration of palliative radiotherapy to the painful metastatic bone lesions. For oral thrush prophylaxis with the steroid treatment, I gave the patient prescription for Diflucan 100 mg by mouth daily For the nausea, I gave the patient prescription for Compazine 10 mg by mouth every 6 hours as needed. I will arrange for the patient to come back for follow-up visit in this in 3 weeks for reevaluation and more detailed discussion of his systemic treatment options after the palliative radiotherapy and the biopsy with the molecular studies. The patient was advised to call immediately if he has any concerning symptoms in the interval. The patient voices  understanding of current disease status and treatment options and is in agreement with the current care plan. All questions were answered. The patient knows to call the clinic with any problems, questions or concerns. We can certainly see the patient much sooner if necessary.  Thank you so much for allowing me to participate in the care of JERRIC OYEN. I will continue to follow up the patient with you and assist in his care.  I spent 55 minutes counseling the patient face to face. The total time spent in the appointment was 80 minutes.  Disclaimer: This note was dictated with voice recognition software. Similar sounding words can inadvertently be transcribed and may not be corrected upon review.   Shawnika Pepin K. July 23, 2016, 10:24  AM

## 2016-07-23 NOTE — Telephone Encounter (Signed)
Oncology Nurse Navigator Documentation  Oncology Nurse Navigator Flowsheets 07/23/2016  Navigator Location [facility]  Navigator Encounter Type Telephone/I have spoken with central scheduling and obtained appt for MRI T spine, MRI Brain and PET scan.  I called Mr. [place] to update him with appt but was unable to reach him. I left vm message to call with my name and phone number.  I also called central scheduling regarding scheduling CT Biopsy.  I was told by them that it will go into review before it is scheduled.   Telephone Outgoing Call  Treatment Phase Abnormal Scans  Barriers/Navigation Needs Coordination of Care  Interventions Coordination of Care  Acuity Level 1  Time Spent with Patient 15

## 2016-07-23 NOTE — Progress Notes (Signed)
Oncology Nurse Navigator Documentation  Oncology Nurse Navigator Flowsheets 07/23/2016  Navigator Location [facility]  Navigator Encounter Type Telephone/I updated authorization coordinator of need for scan authorizations.  She notified me that scans have been authorized.  I called central scheduling to schedule. I called patient and left vm message.  His wife called back and I gave her the schedule.  She requested PET scan to be changed. I called back to central scheduling to obtain another time for PET.  I then called Ms. Ybarra back with an update.  She requested printed schedule of appts.  I sent her this and a map of [place] in the mail today.   Telephone Outgoing Call  Treatment Phase Abnormal Scans  Barriers/Navigation Needs Coordination of Care  Interventions Coordination of Care  Acuity Level 2  Acuity Level 2 Assistance expediting appointments  Time Spent with Patient 51

## 2016-07-24 ENCOUNTER — Encounter (HOSPITAL_COMMUNITY): Payer: Self-pay | Admitting: Emergency Medicine

## 2016-07-24 ENCOUNTER — Encounter: Payer: Self-pay | Admitting: Internal Medicine

## 2016-07-24 ENCOUNTER — Emergency Department (HOSPITAL_COMMUNITY): Payer: Medicare Other

## 2016-07-24 ENCOUNTER — Inpatient Hospital Stay (HOSPITAL_COMMUNITY)
Admission: EM | Admit: 2016-07-24 | Discharge: 2016-07-28 | DRG: 987 | Disposition: A | Discharge status: Home Health | Payer: Medicare Other | Attending: Internal Medicine | Admitting: Internal Medicine

## 2016-07-24 DIAGNOSIS — I6381 Other cerebral infarction due to occlusion or stenosis of small artery: Secondary | ICD-10-CM

## 2016-07-24 DIAGNOSIS — I639 Cerebral infarction, unspecified: Secondary | ICD-10-CM | POA: Diagnosis present

## 2016-07-24 DIAGNOSIS — C3431 Malignant neoplasm of lower lobe, right bronchus or lung: Secondary | ICD-10-CM | POA: Diagnosis present

## 2016-07-24 DIAGNOSIS — R443 Hallucinations, unspecified: Secondary | ICD-10-CM | POA: Diagnosis not present

## 2016-07-24 DIAGNOSIS — R410 Disorientation, unspecified: Secondary | ICD-10-CM

## 2016-07-24 DIAGNOSIS — T380X5A Adverse effect of glucocorticoids and synthetic analogues, initial encounter: Secondary | ICD-10-CM | POA: Diagnosis present

## 2016-07-24 DIAGNOSIS — Z7952 Long term (current) use of systemic steroids: Secondary | ICD-10-CM

## 2016-07-24 DIAGNOSIS — G893 Neoplasm related pain (acute) (chronic): Secondary | ICD-10-CM | POA: Diagnosis present

## 2016-07-24 DIAGNOSIS — D72829 Elevated white blood cell count, unspecified: Secondary | ICD-10-CM | POA: Diagnosis present

## 2016-07-24 DIAGNOSIS — Z79891 Long term (current) use of opiate analgesic: Secondary | ICD-10-CM

## 2016-07-24 DIAGNOSIS — H409 Unspecified glaucoma: Secondary | ICD-10-CM | POA: Diagnosis present

## 2016-07-24 DIAGNOSIS — Z823 Family history of stroke: Secondary | ICD-10-CM

## 2016-07-24 DIAGNOSIS — I10 Essential (primary) hypertension: Secondary | ICD-10-CM | POA: Diagnosis present

## 2016-07-24 DIAGNOSIS — Z7982 Long term (current) use of aspirin: Secondary | ICD-10-CM

## 2016-07-24 DIAGNOSIS — F11921 Opioid use, unspecified with intoxication delirium: Principal | ICD-10-CM | POA: Diagnosis present

## 2016-07-24 DIAGNOSIS — R918 Other nonspecific abnormal finding of lung field: Secondary | ICD-10-CM | POA: Diagnosis present

## 2016-07-24 DIAGNOSIS — K5900 Constipation, unspecified: Secondary | ICD-10-CM | POA: Diagnosis present

## 2016-07-24 DIAGNOSIS — C7951 Secondary malignant neoplasm of bone: Secondary | ICD-10-CM | POA: Diagnosis present

## 2016-07-24 DIAGNOSIS — C799 Secondary malignant neoplasm of unspecified site: Secondary | ICD-10-CM

## 2016-07-24 DIAGNOSIS — F19921 Other psychoactive substance use, unspecified with intoxication with delirium: Secondary | ICD-10-CM | POA: Diagnosis present

## 2016-07-24 DIAGNOSIS — R7989 Other specified abnormal findings of blood chemistry: Secondary | ICD-10-CM | POA: Diagnosis present

## 2016-07-24 DIAGNOSIS — R634 Abnormal weight loss: Secondary | ICD-10-CM

## 2016-07-24 DIAGNOSIS — E785 Hyperlipidemia, unspecified: Secondary | ICD-10-CM | POA: Diagnosis present

## 2016-07-24 DIAGNOSIS — R4182 Altered mental status, unspecified: Secondary | ICD-10-CM

## 2016-07-24 DIAGNOSIS — Z79899 Other long term (current) drug therapy: Secondary | ICD-10-CM

## 2016-07-24 DIAGNOSIS — C3490 Malignant neoplasm of unspecified part of unspecified bronchus or lung: Secondary | ICD-10-CM

## 2016-07-24 DIAGNOSIS — T402X5A Adverse effect of other opioids, initial encounter: Secondary | ICD-10-CM | POA: Diagnosis present

## 2016-07-24 HISTORY — DX: Abnormal weight loss: R63.4

## 2016-07-24 HISTORY — DX: Neoplasm related pain (acute) (chronic): G89.3

## 2016-07-24 LAB — COMPREHENSIVE METABOLIC PANEL
ALT: 43 U/L (ref 17–63)
ANION GAP: 9 (ref 5–15)
AST: 34 U/L (ref 15–41)
Albumin: 3.1 g/dL — ABNORMAL LOW (ref 3.5–5.0)
Alkaline Phosphatase: 117 U/L (ref 38–126)
BUN: 28 mg/dL — ABNORMAL HIGH (ref 6–20)
CHLORIDE: 106 mmol/L (ref 101–111)
CO2: 24 mmol/L (ref 22–32)
Calcium: 9.5 mg/dL (ref 8.9–10.3)
Creatinine, Ser: 1.03 mg/dL (ref 0.61–1.24)
GFR calc non Af Amer: >60 mL/min (ref >60)
Glucose, Bld: 143 mg/dL — ABNORMAL HIGH (ref 65–99)
Potassium: 4.2 mmol/L (ref 3.5–5.1)
SODIUM: 139 mmol/L (ref 135–145)
Total Bilirubin: 0.6 mg/dL (ref 0.3–1.2)
Total Protein: 6.8 g/dL (ref 6.5–8.1)

## 2016-07-24 LAB — CBC WITH DIFFERENTIAL/PLATELET
BASOS ABS: 0 10*3/uL (ref 0.0–0.1)
Basophils Relative: 0 %
EOS PCT: 0 %
Eosinophils Absolute: 0 10*3/uL (ref 0.0–0.7)
HEMATOCRIT: 34.4 % — AB (ref 39.0–52.0)
Hemoglobin: 11.4 g/dL — ABNORMAL LOW (ref 13.0–17.0)
LYMPHS ABS: 0.3 10*3/uL — AB (ref 0.7–4.0)
LYMPHS PCT: 2 %
MCH: 30.5 pg (ref 26.0–34.0)
MCHC: 33.1 g/dL (ref 30.0–36.0)
MCV: 92 fL (ref 78.0–100.0)
MONO ABS: 0.9 10*3/uL (ref 0.1–1.0)
Monocytes Relative: 7 %
NEUTROS ABS: 12.3 10*3/uL — AB (ref 1.7–7.7)
Neutrophils Relative %: 91 %
PLATELETS: 542 10*3/uL — AB (ref 150–400)
RBC: 3.74 MIL/uL — ABNORMAL LOW (ref 4.22–5.81)
RDW: 13.5 % (ref 11.5–15.5)
WBC: 13.4 10*3/uL — ABNORMAL HIGH (ref 4.0–10.5)

## 2016-07-24 MED ORDER — MORPHINE SULFATE ER 30 MG PO TBCR
30.0000 mg | EXTENDED_RELEASE_TABLET | Freq: Once | ORAL | Status: AC
  Administered 2016-07-24: 30 mg via ORAL
  Filled 2016-07-24: qty 2

## 2016-07-24 MED ORDER — GADOBENATE DIMEGLUMINE 529 MG/ML IV SOLN
16.0000 mL | Freq: Once | INTRAVENOUS | Status: AC | PRN
  Administered 2016-07-24: 16 mL via INTRAVENOUS

## 2016-07-24 NOTE — ED Notes (Signed)
Patient transported to MRI 

## 2016-07-24 NOTE — ED Triage Notes (Signed)
Per wife-states he has been taking 80 mg of hydrocodone for pain associated with metz-states pain meds changed to 30 mg of morphine-states he has gotten progressively confused-getting up in the middle of the night-patient states he has slight nausea and having some lower back pain-oriented to time, self, and place-not to date

## 2016-07-24 NOTE — ED Notes (Signed)
Family at bedside. Pt repositioned.

## 2016-07-24 NOTE — ED Provider Notes (Signed)
[facility] DEPT Provider Note   CSN: [id number] Arrival date & time: 07/24/16  5427     History   Chief Complaint Chief Complaint  Patient presents with  . AMS    HPI Warren Odonnell is a 71 y.o. male.  The history is provided by a relative and the patient.  Altered Mental Status   This is a new problem. The current episode started 6 to 12 hours ago. Associated symptoms include confusion, agitation and hallucinations (seeing things are not there). Pertinent negatives include no somnolence, no seizures, no unresponsiveness, no delusions and no self-injury. Risk factors include a recent infection (URI since Jan). Past medical history comments: h/o lung cancer with bone mets. currently on narcotic pain medication.    Pt was started on Decadron yesterday, first dose prior to symptoms. There was a change in his medication from Zofran to Compazine (first dose prior to symptoms) and '80mg'$  of Oxy to '40mg'$  of Morphine (first dose was last night).   Past Medical History:  Diagnosis Date  . Bone metastases ([place]) 07/23/2016  . 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 Glaucoma   . Testicular swelling   . Weight loss 07/24/2016    Patient Active Problem List   Diagnosis Date Noted  . 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 Weight loss 07/24/2016  . Bone metastases ([place]) 07/23/2016  . Lung mass 07/19/2016    Past Surgical History:  Procedure Laterality Date  . HERNIA REPAIR  1984  . testicular torsion Left        Home Medications    Prior to Admission medications   Medication Sig Start Date End Date Taking? Authorizing Provider  aspirin 81 MG chewable tablet Chew 81 mg by mouth.    Historical Provider, MD  bimatoprost (LUMIGAN) 0.03 % ophthalmic solution 1 drop nightly.    Historical Provider, MD  dexamethasone (DECADRON) 4 MG tablet Take 1 tablet (4 mg total) by mouth 3 (three) times daily. 07/23/16   Curt Bears, MD  dorzolamide (TRUSOPT) 2 % ophthalmic solution  05/18/16    Historical Provider, MD  esomeprazole (NEXIUM) 20 MG capsule Take 20 mg by mouth daily at 12 noon.    Historical Provider, MD  fluconazole (DIFLUCAN) 100 MG tablet Take 1 tablet (100 mg total) by mouth daily. 07/23/16   Curt Bears, MD  HYDROcodone-acetaminophen (NORCO) 10-325 MG tablet 2 tablets every 6 (six) hours as needed.  07/12/16   Historical Provider, MD  MELOXICAM PO Take by mouth.    Historical Provider, MD  morphine (MS CONTIN) 30 MG 12 hr tablet Take 1 tablet (30 mg total) by mouth every 12 (twelve) hours. 07/23/16   Curt Bears, MD  Multiple Vitamin (MULTIVITAMIN) tablet Take 1 tablet by mouth daily.    Historical Provider, MD  ondansetron (ZOFRAN) 8 MG tablet Take 8 mg by mouth every 8 (eight) hours as needed for nausea or vomiting.    Historical Provider, MD  prochlorperazine (COMPAZINE) 10 MG tablet Take 1 tablet (10 mg total) by mouth every 6 (six) hours as needed for nausea or vomiting. 07/23/16   Curt Bears, MD  sildenafil (REVATIO) 20 MG tablet TAKE 2-5 TABS BY MOUTH ONCE DAILY AS NEEDED 05/08/16   Historical Provider, MD  triamcinolone cream (KENALOG) 0.1 %  05/08/16   Historical Provider, MD    Family History No family history on file.  Social History Social History  Substance Use Topics  . Smoking status: Never Smoker  . Smokeless tobacco: Never Used  .  Alcohol use No     Allergies   Patient has no known allergies.   Review of Systems Review of Systems  Neurological: Negative for seizures.  Psychiatric/Behavioral: Positive for agitation, confusion and hallucinations (seeing things are not there). Negative for self-injury.  Ten systems are reviewed and are negative for acute change except as noted in the HPI    Physical Exam Updated Vital Signs BP 140/100 (BP Location: Right Arm)   Pulse 106   Temp 98 F (36.7 C) (Oral)   Resp 18   SpO2 94%   Physical Exam  Constitutional: He is oriented to person, place, and time. He appears well-developed  and well-nourished. No distress.  HENT:  Head: Normocephalic and atraumatic.  Nose: Nose normal.  Eyes: Conjunctivae and EOM are normal. Pupils are equal, round, and reactive to light. Right eye exhibits no discharge. Left eye exhibits no discharge. No scleral icterus.  Neck: Normal range of motion. Neck supple.  Cardiovascular: Normal rate and regular rhythm.  Exam reveals no gallop and no friction rub.   No murmur heard. Pulmonary/Chest: Effort normal and breath sounds normal. No stridor. No respiratory distress. He has no rales.  Abdominal: Soft. He exhibits no distension. There is no tenderness.  Musculoskeletal: He exhibits no edema or tenderness.  Neurological: He is alert and oriented to person, place, and time.  Skin: Skin is warm and dry. No rash noted. He is not diaphoretic. No erythema.  Psychiatric: He has a normal mood and affect.  Vitals reviewed.    ED Treatments / Results  Labs (all labs ordered are listed, but only abnormal results are displayed) Labs Reviewed - No data to display  EKG  EKG Interpretation None       Radiology No results found.  Procedures Procedures (including critical care time)  Medications Ordered in ED Medications - No data to display   Initial Impression / Assessment and Plan / ED Course  I have reviewed the triage vital signs and the nursing notes.  Pertinent labs & imaging results that were available during my care of the patient were reviewed by me and considered in my medical decision making (see chart for details).  Clinical Course as of Jul 25 20  Sat Jul 24, 2016  1830 Feel presentation is most consistent with medication side effect given the Decadron and Compazine. Labs from yesterday revealed no significant electrolyte derangements including calcium. We'll send screening labs and touch base with oncologist for further recommendations.  [PC]  2115 Discussed case with on-call oncologist Dr. Jana Hakim who requested that we  obtain MRI with contrast to assess for possible small metastatic disease. If this is negative, he recommended we decreased the dose of Decadron by half and discontinue Compazine and return to Zofran use.  [PC]  1610 MRI without evidence of metastatic disease however, there is a small ischemic stroke within the right parietal lobe. Do not feel that this is the cause of the patient's symptoms however given his history of metastatic disease, he is at high risk for blood clots and will likely need to be assessed for PFO. Neurology consultation.  [PC]  Sun Jul 25, 2016  0020 Discussed case with neurologist who recommended admission and transferred to [facility] for stroke workup. Hospitalist consulted for admission.  [PC]    Clinical Course User Index [PC] Fatima Blank, MD     Final Clinical Impressions(s) / ED Diagnoses   Final diagnoses:  Cerebrovascular accident (CVA), unspecified mechanism ([place])  Hallucination  Confusion     Fatima Blank, MD 07/25/16 [id number]

## 2016-07-25 ENCOUNTER — Observation Stay (HOSPITAL_COMMUNITY): Payer: Medicare Other

## 2016-07-25 DIAGNOSIS — I639 Cerebral infarction, unspecified: Secondary | ICD-10-CM

## 2016-07-25 DIAGNOSIS — I6381 Other cerebral infarction due to occlusion or stenosis of small artery: Secondary | ICD-10-CM

## 2016-07-25 DIAGNOSIS — Z79899 Other long term (current) drug therapy: Secondary | ICD-10-CM | POA: Diagnosis not present

## 2016-07-25 DIAGNOSIS — Z7952 Long term (current) use of systemic steroids: Secondary | ICD-10-CM | POA: Diagnosis not present

## 2016-07-25 DIAGNOSIS — I10 Essential (primary) hypertension: Secondary | ICD-10-CM | POA: Diagnosis present

## 2016-07-25 DIAGNOSIS — Z823 Family history of stroke: Secondary | ICD-10-CM | POA: Diagnosis not present

## 2016-07-25 DIAGNOSIS — R918 Other nonspecific abnormal finding of lung field: Secondary | ICD-10-CM

## 2016-07-25 DIAGNOSIS — R7989 Other specified abnormal findings of blood chemistry: Secondary | ICD-10-CM | POA: Diagnosis present

## 2016-07-25 DIAGNOSIS — I6300 Cerebral infarction due to thrombosis of unspecified precerebral artery: Secondary | ICD-10-CM | POA: Diagnosis not present

## 2016-07-25 DIAGNOSIS — Z79891 Long term (current) use of opiate analgesic: Secondary | ICD-10-CM | POA: Diagnosis not present

## 2016-07-25 DIAGNOSIS — F11921 Opioid use, unspecified with intoxication delirium: Secondary | ICD-10-CM | POA: Diagnosis present

## 2016-07-25 DIAGNOSIS — C7951 Secondary malignant neoplasm of bone: Secondary | ICD-10-CM

## 2016-07-25 DIAGNOSIS — F19921 Other psychoactive substance use, unspecified with intoxication with delirium: Secondary | ICD-10-CM | POA: Diagnosis present

## 2016-07-25 DIAGNOSIS — T402X5A Adverse effect of other opioids, initial encounter: Secondary | ICD-10-CM | POA: Diagnosis present

## 2016-07-25 DIAGNOSIS — C3431 Malignant neoplasm of lower lobe, right bronchus or lung: Secondary | ICD-10-CM | POA: Diagnosis present

## 2016-07-25 DIAGNOSIS — R443 Hallucinations, unspecified: Secondary | ICD-10-CM | POA: Diagnosis present

## 2016-07-25 DIAGNOSIS — T380X5A Adverse effect of glucocorticoids and synthetic analogues, initial encounter: Secondary | ICD-10-CM | POA: Diagnosis present

## 2016-07-25 DIAGNOSIS — R410 Disorientation, unspecified: Secondary | ICD-10-CM | POA: Diagnosis not present

## 2016-07-25 DIAGNOSIS — E785 Hyperlipidemia, unspecified: Secondary | ICD-10-CM | POA: Diagnosis present

## 2016-07-25 DIAGNOSIS — H409 Unspecified glaucoma: Secondary | ICD-10-CM | POA: Diagnosis present

## 2016-07-25 DIAGNOSIS — G459 Transient cerebral ischemic attack, unspecified: Secondary | ICD-10-CM | POA: Diagnosis not present

## 2016-07-25 DIAGNOSIS — G893 Neoplasm related pain (acute) (chronic): Secondary | ICD-10-CM

## 2016-07-25 DIAGNOSIS — Z7982 Long term (current) use of aspirin: Secondary | ICD-10-CM | POA: Diagnosis not present

## 2016-07-25 DIAGNOSIS — D72829 Elevated white blood cell count, unspecified: Secondary | ICD-10-CM | POA: Diagnosis present

## 2016-07-25 DIAGNOSIS — K5900 Constipation, unspecified: Secondary | ICD-10-CM | POA: Diagnosis present

## 2016-07-25 LAB — LIPID PANEL
Cholesterol: 164 mg/dL (ref 0–200)
HDL: 50 mg/dL (ref >40)
LDL CALC: 97 mg/dL (ref 0–99)
Total CHOL/HDL Ratio: 3.3 RATIO
Triglycerides: 85 mg/dL (ref <150)
VLDL: 17 mg/dL (ref 0–40)

## 2016-07-25 LAB — AMMONIA: Ammonia: 14 umol/L (ref 9–35)

## 2016-07-25 LAB — TSH: TSH: 0.53 u[IU]/mL (ref 0.350–4.500)

## 2016-07-25 MED ORDER — ENSURE ENLIVE PO LIQD
237.0000 mL | Freq: Two times a day (BID) | ORAL | Status: DC
  Administered 2016-07-25 – 2016-07-26 (×3): 237 mL via ORAL
  Filled 2016-07-25 (×7): qty 237

## 2016-07-25 MED ORDER — POLYETHYLENE GLYCOL 3350 17 G PO PACK
17.0000 g | PACK | Freq: Every day | ORAL | Status: DC
  Administered 2016-07-25: 17 g via ORAL
  Filled 2016-07-25 (×2): qty 1

## 2016-07-25 MED ORDER — ACETAMINOPHEN 160 MG/5ML PO SOLN: 650.0000 mg | ORAL | Status: DC | PRN

## 2016-07-25 MED ORDER — ENOXAPARIN SODIUM 40 MG/0.4ML ~~LOC~~ SOLN
40.0000 mg | Freq: Every day | SUBCUTANEOUS | Status: DC
  Administered 2016-07-25 – 2016-07-27 (×3): 40 mg via SUBCUTANEOUS
  Filled 2016-07-25 (×3): qty 0.4

## 2016-07-25 MED ORDER — FLUCONAZOLE 100 MG PO TABS
100.0000 mg | ORAL_TABLET | Freq: Every day | ORAL | Status: DC
  Administered 2016-07-25 – 2016-07-28 (×4): 100 mg via ORAL
  Filled 2016-07-25 (×4): qty 1

## 2016-07-25 MED ORDER — SENNOSIDES-DOCUSATE SODIUM 8.6-50 MG PO TABS
1.0000 | ORAL_TABLET | Freq: Every day | ORAL | Status: DC
Start: 2016-07-25 — End: 2016-07-26
  Administered 2016-07-25: 1 via ORAL
  Filled 2016-07-25: qty 1

## 2016-07-25 MED ORDER — THIAMINE HCL 100 MG/ML IJ SOLN
100.0000 mg | Freq: Every day | INTRAMUSCULAR | Status: DC
  Administered 2016-07-25: 100 mg via INTRAVENOUS
  Filled 2016-07-25: qty 2

## 2016-07-25 MED ORDER — SODIUM CHLORIDE 0.9 % IV SOLN
INTRAVENOUS | Status: AC
  Administered 2016-07-25: 03:00:00 via INTRAVENOUS

## 2016-07-25 MED ORDER — MORPHINE SULFATE ER 15 MG PO TBCR
15.0000 mg | EXTENDED_RELEASE_TABLET | Freq: Two times a day (BID) | ORAL | Status: DC
  Administered 2016-07-25 – 2016-07-28 (×7): 15 mg via ORAL
  Filled 2016-07-25 (×7): qty 1

## 2016-07-25 MED ORDER — DEXAMETHASONE 4 MG PO TABS
4.0000 mg | ORAL_TABLET | Freq: Two times a day (BID) | ORAL | Status: DC
  Administered 2016-07-25: 4 mg via ORAL
  Filled 2016-07-25: qty 1

## 2016-07-25 MED ORDER — HYDROCODONE-ACETAMINOPHEN 5-325 MG PO TABS
1.0000 | ORAL_TABLET | Freq: Three times a day (TID) | ORAL | Status: DC | PRN
  Administered 2016-07-26 – 2016-07-28 (×3): 1 via ORAL
  Filled 2016-07-25 (×4): qty 1

## 2016-07-25 MED ORDER — ORAL CARE MOUTH RINSE
15.0000 mL | Freq: Two times a day (BID) | OROMUCOSAL | Status: DC
  Administered 2016-07-25 – 2016-07-27 (×4): 15 mL via OROMUCOSAL

## 2016-07-25 MED ORDER — VITAMIN B-1 100 MG PO TABS
100.0000 mg | ORAL_TABLET | Freq: Every day | ORAL | Status: DC
  Administered 2016-07-26 – 2016-07-28 (×3): 100 mg via ORAL
  Filled 2016-07-25 (×3): qty 1

## 2016-07-25 MED ORDER — STROKE: EARLY STAGES OF RECOVERY BOOK
Freq: Once | Status: AC
Start: 2016-07-25 — End: 2016-07-25
  Administered 2016-07-25: 03:00:00
  Filled 2016-07-25: qty 1

## 2016-07-25 MED ORDER — ATORVASTATIN CALCIUM 10 MG PO TABS
20.0000 mg | ORAL_TABLET | Freq: Every day | ORAL | Status: DC
  Administered 2016-07-25 – 2016-07-27 (×3): 20 mg via ORAL
  Filled 2016-07-25 (×3): qty 2

## 2016-07-25 MED ORDER — ACETAMINOPHEN 650 MG RE SUPP: 650.0000 mg | RECTAL | Status: DC | PRN

## 2016-07-25 MED ORDER — DEXAMETHASONE 2 MG PO TABS
2.0000 mg | ORAL_TABLET | Freq: Three times a day (TID) | ORAL | Status: DC
  Administered 2016-07-25 – 2016-07-28 (×8): 2 mg via ORAL
  Filled 2016-07-25 (×10): qty 1

## 2016-07-25 MED ORDER — ASPIRIN 325 MG PO TABS
325.0000 mg | ORAL_TABLET | Freq: Every day | ORAL | Status: DC
  Administered 2016-07-25 – 2016-07-28 (×4): 325 mg via ORAL
  Filled 2016-07-25 (×4): qty 1

## 2016-07-25 MED ORDER — ASPIRIN 300 MG RE SUPP: 300.0000 mg | Freq: Every day | RECTAL | Status: DC

## 2016-07-25 MED ORDER — ACETAMINOPHEN 325 MG PO TABS
650.0000 mg | ORAL_TABLET | ORAL | Status: DC | PRN
  Administered 2016-07-26: 650 mg via ORAL
  Filled 2016-07-25 (×2): qty 2

## 2016-07-25 NOTE — Consult Note (Signed)
Neurology Consultation Reason for Consult: Altered mental status, stroke Referring Physician: Cardama, P  CC: Hallucinations  History is obtained from: Patient, family  HPI: Warren Odonnell is a 71 y.o. male with diagnosis of  Lung cancer with bone metastasis. He has been having pain, and was taking increasing doses of hydrocodone when on Thursday he began to have hallucinations. He saw Dr. Inda Merlin on Friday where he complained of continued pain due to his bone metastasis. He was therefore started on MS Contin and Decadron following which the patient became much more confused and hallucinations increased considerably.  Due to this, he was referred to the emergency department where an MRI of the brain was performed which shows a small likely incidental ischemic infarct.  Family also notes, that he has not been walking very well and he complains of double vision for the past 4 days (though unclear if this is because he just got bifocals).  He has not been eating well at all, and when he eats, he eats only carbohydrates.  LKW: Unclear tpa given?: no, unclear time of onset    ROS: A 14 point ROS was performed and is negative except as noted in the HPI.   Past Medical History:  Diagnosis Date  . Bone metastases ([place]) 07/23/2016  . 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 Glaucoma   . Testicular swelling   . Weight loss 07/24/2016     Family history: Sister-stroke, father-MI at 30   Social History:  reports that he has never smoked. He has never used smokeless tobacco. He reports that he does not drink alcohol or use drugs.   Exam: Current vital signs: BP 166/87   Pulse 84   Temp 98.3 F (36.8 C)   Resp 18   Ht '5\' 11"'$  (1.803 m)   Wt 77.6 kg (171 lb)   SpO2 90%   BMI 23.85 kg/m  Vital signs in last 24 hours: Temp:  [98 F (36.7 C)-98.3 F (36.8 C)] 98.3 F (36.8 C) (02/18 0014) Pulse Rate:  [81-106] 84 (02/18 0105) Resp:  [18] 18 (02/18 0105) BP: (140-171)/(83-100) 166/87  (02/18 0105) SpO2:  [87 %-94 %] 90 % (02/18 0105) Weight:  [77.6 kg (171 lb)] 77.6 kg (171 lb) (02/17 2235)   Physical Exam  Constitutional: Appears well-developed and well-nourished.  Psych: Affect appropriate to situation Eyes: No scleral injection HENT: No OP obstrucion Head: Normocephalic.  Cardiovascular: Normal rate and regular rhythm.  Respiratory: Effort normal and breath sounds normal to anterior ascultation GI: Soft.  No distension. There is no tenderness.  Skin: WDI  Neuro: Mental Status: Patient is awake, alert, he responds to unseen stimuli repeatedly, asking me about my dog, and indicating that he saw something on my face which was not there. Cranial Nerves: II: Visual Fields are full. Pupils are equal, round, and reactive to light.   III,IV, VI: EOMI without ptosis or diploplia.  V: Facial sensation is symmetric to temperature VII: Facial movement is symmetric.  VIII: hearing is intact to voice X: Uvula elevates symmetrically XI: Shoulder shrug is symmetric. XII: tongue is midline without atrophy or fasciculations.  Motor: Tone is normal. Bulk is normal. 5/5 strength was present in all four extremities.  Sensory: Sensation is symmetric to light touch and temperature in the arms and legs. Cerebellar: No clear ataxia on finger nose finger   I have reviewed labs in epic and the results pertinent to this consultation are: CMP-low albumin CBC-leukocytosis and thrombocytosis   I have reviewed  the images obtained: MRI brain-small infarct in the white matter right parietal region  Impression: 71 year old male with infarct in the right parietal region. The location would be most typical for small vessel disease, however the radiating shape does make me wonder if it could've been a small embolus. For now, would use aspirin and pursue further workup.  I suspect that his delirium is mostly related to his narcotics and certainly would expect Decadron to contribute as  well.  Recommendations: 1. HgbA1c, fasting lipid panel 2. MRI, MRA  of the brain without contrast 3. Frequent neuro checks 4. Echocardiogram 5. Carotid dopplers 6. Prophylactic therapy-Antiplatelet med: Aspirin - dose '325mg'$  PO or '300mg'$  PR 7. Risk factor modification 8. Telemetry monitoring 9. PT consult, OT consult, Speech consult 10. please page stroke NP  Or  PA  Or MD  from 8am -4 pm starting 2/18 as this patient will be followed by the stroke team at this point.   You can look them up on [email or web address]     Roland Rack, MD [facility] [phone]  If 7pm- 7am, please page neurology on call as listed in [place].

## 2016-07-25 NOTE — Progress Notes (Signed)
PROGRESS NOTE                                                                                                                                                                                                             Patient Demographics:    Warren Odonnell, is a 71 y.o. male, DOB - [birth year], DXI:[id number]  Admit date - 07/24/2016   Admitting Physician Edwin Dada, MD  Outpatient Primary MD for the patient is Shirline Frees, MD  LOS - 0  Outpatient Specialists: [facility] Dr Earlie Server  Chief Complaint  Patient presents with  . AMS       Brief Narrative   71 y.o. male with a past medical history significant for newly diagnosed lung mass and widespread bone metastases who presents with delirium Thought to be secondary to recently started MS Contin, and steroids for pain control, as well MRI brain was significant for acute CVA.   Subjective:    Warren Odonnell today Is sleeping comfortably, wakes up answering questions, he denies any complaints, no significant events overnight.   Assessment  & Plan :    Principal Problem:   Stroke [facility]) Active Problems:   Lung mass   Bone metastases (HCC)   Cancer associated pain   Delirium   Acute Stroke:  - This is new.    MRI shows subcentimeter infarct, white-gray matter jcn,  - Neurology input  with greatly appreciated, continue with aspirin 325 mg daily, follow lipid panels, hemoglobin A1c,, carotid Dopplers, 2-D echo - neuro following  Delirium:  - This actually is the primary complaint, not clear that this is related to his infarct.   this is most likely related to his narcotic and steroids use , Decadron dose has been lowered, MS Contin dose has been decreased by half, follow on urine analysis, and monitor clinically.  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 Tammi Klippel on Monday.  Has PET scan appointment at [facility] on Thursday.  Message sent to Dr.  Earlie Server informing him of admission. - Continue Decadron, Morphine, Norco, Diflucan    Code Status : Full  Family Communication  : Wife at bedside  Disposition Plan  : pending further work up.  Consults  :  Neurology  Procedures  : None  DVT Prophylaxis  :  Lovenox -  SCDs  Lab Results  Component Value Date   PLT 542 (H) 07/24/2016    Antibiotics  :    Anti-infectives    Start     Dose/Rate Route Frequency Ordered Stop   07/25/16 1000  fluconazole (DIFLUCAN) tablet 100 mg     100 mg Oral Daily 07/25/16 0217          Objective:   Vitals:   07/25/16 0430 07/25/16 0622 07/25/16 1030 07/25/16 1230  BP: (!) 155/71 121/72 122/78 (!) 148/76  Pulse: 94 73 91 77  Resp: '18 18 20 18  ' Temp: 27.0 F (36.9 C)   98.1 F (36.7 C)  TempSrc: Oral   Oral  SpO2: 94% 99% 97% 97%  Weight:      Height:        Wt Readings from Last 3 Encounters:  07/25/16 76.4 kg (168 lb 8 oz)  07/23/16 77.7 kg (171 lb 3.2 oz)  06/29/16 81.6 kg (180 lb)     Intake/Output Summary (Last 24 hours) at 07/25/16 1246 Last data filed at 07/25/16 1236  Gross per 24 hour  Intake              640 ml  Output                0 ml  Net              640 ml     Physical Exam  Sleeping comfortably , wakes up , confused Supple Neck,No JVD,  Symmetrical Chest wall movement, Good air movement bilaterally, CTAB RRR,No Gallops,Rubs or new Murmurs, No Parasternal Heave +ve B.Sounds, Abd Soft, No tenderness,  No rebound - guarding or rigidity. No Cyanosis, Clubbing or edema, No new Rash or bruise      Data Review:    CBC  Recent Labs Lab 07/23/16 0854 07/24/16 1933  WBC 11.4* 13.4*  HGB 12.3* 11.4*  HCT 35.8* 34.4*  PLT 503* 542*  MCV 91.6 92.0  MCH 31.5 30.5  MCHC 34.3 33.1  RDW 13.6 13.5  LYMPHSABS 1.0 0.3*  MONOABS 1.3* 0.9  EOSABS 0.1 0.0  BASOSABS 0.1 0.0    Chemistries   Recent Labs Lab 07/23/16 0854 07/24/16 1933  NA 139 139  K 3.7 4.2  CL  --  106  CO2 22 24    GLUCOSE 131 143*  BUN 23.0 28*  CREATININE 1.0 1.03  CALCIUM 10.3 9.5  AST 32 34  ALT 48 43  ALKPHOS 121 117  BILITOT 0.65 0.6   ------------------------------------------------------------------------------------------------------------------  Recent Labs  07/25/16 0457  CHOL 164  HDL 50  LDLCALC 97  TRIG 85  CHOLHDL 3.3    No results found for: HGBA1C ------------------------------------------------------------------------------------------------------------------  Recent Labs  07/25/16 0457  TSH 0.530   ------------------------------------------------------------------------------------------------------------------ No results for input(s): VITAMINB12, FOLATE, FERRITIN, TIBC, IRON, RETICCTPCT in the last 72 hours.  Coagulation profile No results for input(s): INR, PROTIME in the last 168 hours.  No results for input(s): DDIMER in the last 72 hours.  Cardiac Enzymes No results for input(s): CKMB, TROPONINI, MYOGLOBIN in the last 168 hours.  Invalid input(s): CK ------------------------------------------------------------------------------------------------------------------ No results found for: BNP  Inpatient Medications  Scheduled Meds: . aspirin  300 mg Rectal Daily   Or  . aspirin  325 mg Oral Daily  . dexamethasone  2 mg Oral Q8H  . enoxaparin (LOVENOX) injection  40 mg Subcutaneous Daily  . fluconazole  100 mg Oral Daily  . mouth rinse  15 mL Mouth Rinse  BID  . morphine  15 mg Oral Q12H  . polyethylene glycol  17 g Oral Daily  . senna-docusate  1 tablet Oral QHS  . [START ON 07/26/2016] thiamine  100 mg Oral Daily   Continuous Infusions: PRN Meds:.acetaminophen **OR** acetaminophen (TYLENOL) oral liquid 160 mg/5 mL **OR** acetaminophen, HYDROcodone-acetaminophen  Micro Results No results found for this or any previous visit (from the past 240 hour(s)).  Radiology Reports Ct Chest W Contrast  Result Date: 07/15/2016 CLINICAL DATA:  Low back  and left hip pain. Multiple bony lesions most consistent with metastatic disease or myeloma by prior MRI. EXAM: CT CHEST, ABDOMEN, AND PELVIS WITH CONTRAST TECHNIQUE: Multidetector CT imaging of the chest, abdomen and pelvis was performed following the standard protocol during bolus administration of intravenous contrast. CONTRAST:  100 ml ISOVUE-300 IOPAMIDOL (ISOVUE-300) INJECTION 61% COMPARISON:  None. MRI lumbar spine 07/03/2016. FINDINGS: CT CHEST FINDINGS Cardiovascular: Heart size normal. No pericardial effusion. Calcific aortic atherosclerosis noted. Mediastinum/Nodes: There is mediastinal lymphadenopathy. Node just anterior to the carina eccentric to the right on image 42 measures 1.6 cm by 2.6 cm. Right hilar lymph node on image 48 measures 2.6 by 2.2 cm. Lungs/Pleura: Small right pleural effusion is identified. There is mild thickening and nodularity of the superior aspect of the pleura along right major fissure. No discrete pleural nodule is identified. Solid mass in the right lower lobe on image 80 measures 2.3 x 3.3 cm. Mild dependent atelectasis is seen on the left. Musculoskeletal: Multiple lytic lesions are identified in the thoracic spine. Largest lesion is in T6 where there is a pathologic compression fracture deformity with some bony retropulsion. No obvious stenosis. Lytic lesions are also identified in scattered ribs, see for example the posterior arc of the right fifth rib on image 29. CT ABDOMEN PELVIS FINDINGS Hepatobiliary: No focal liver abnormality is seen. No gallstones, gallbladder wall thickening, or biliary dilatation. Pancreas: Unremarkable. No pancreatic ductal dilatation or surrounding inflammatory changes. Spleen: Normal in size without focal abnormality. Adrenals/Urinary Tract: Parapelvic renal cysts are seen and more prominent on the left. No solid renal lesion is identified. Ureters and urinary bladder appear normal. The adrenal glands are normal in appearance. Stomach/Bowel:  Sigmoid diverticulosis without diverticulitis is noted. The colon is otherwise unremarkable. Stomach, small bowel and appendix appear normal. Vascular/Lymphatic: No significant vascular findings are present. No enlarged abdominal or pelvic lymph nodes. Reproductive: Prostate is unremarkable. Other: No abdominal wall hernia or abnormality. No abdominopelvic ascites. 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 Large lytic lesions are also seen in the posterior right ilium and sacrum. No fracture. 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 Pathologic T6 fracture with bony retropulsion but no obvious central canal stenosis. Evaluation for epidural tumor is limited on CT scan. If the patient has myelopathic symptoms, MRI of the thoracic spine with and without contrast could be used for further evaluation. Calcific aortic and coronary atherosclerosis. Sigmoid diverticulosis without diverticulitis. Electronically Signed   By: Inge Rise M.D.   On: 07/15/2016 10:36   Mr Jeri Cos AL Contrast  Result Date: 07/24/2016 CLINICAL DATA:  Initial evaluation for acute altered mental status. History of lung cancer with osseous metastases. EXAM: MRI HEAD WITHOUT AND WITH CONTRAST TECHNIQUE: Multiplanar, multiecho pulse sequences of the brain and surrounding structures were obtained without and  with intravenous contrast. CONTRAST:  99m MULTIHANCE GADOBENATE DIMEGLUMINE 529 MG/ML IV SOLN COMPARISON:  Prior MRI from 05/13/2016. FINDINGS: Brain: Study mildly degraded by motion artifact. Mild diffuse prominence of the CSF containing spaces is compatible with generalized age-related cerebral atrophy. Patchy and confluent T2/FLAIR hyperintensity within the periventricular and deep  white matter both cerebral hemispheres, most consistent with chronic microvascular ischemic disease, mildly progressed relative to previous. There is a 9 mm focus of linear restricted diffusion within the subcortical/deep white matter of the right parietal lobe, compatible with a small acute ischemic small vessel infarct (series 4, image 33). No associated mass effect or hemorrhage. No other evidence for acute for subacute ischemia. Gray-white matter differentiation otherwise maintained. No evidence for acute or chronic intracranial hemorrhage. No other evidence for chronic infarction. No mass lesion, midline shift or mass effect. No hydrocephalus. No extra-axial fluid collection. Major dural sinuses are grossly patent. No abnormal enhancement. Pituitary gland and suprasellar region within normal limits. Vascular: Major intracranial vascular flow voids are maintained. Skull and upper cervical spine: Abnormal signal intensity within the left occipital condyle with associated enhancement, likely an osseous metastatic lesion (series 11, image 7). Possible additional osseous metastatic lesion within the C3 vertebral body (series 13, image 14), not well evaluated on this exam. No significant extraosseous tumor. No stenosis within the upper cervical spine. No calvarial metastatic lesions identified. Sinuses/Orbits: Globes and orbital soft tissues demonstrate no acute abnormality. Axial myopia noted. Paranasal sinuses are clear. Small bilateral mastoid effusions, slightly larger on the left. Inner ear structures grossly unremarkable. IMPRESSION: 1. 9 mm acute ischemic small vessel white matter infarct within the subcortical/deep white matter of the right parietal lobe. No associated hemorrhage or mass effect. 2. No other acute intracranial process identified. 3. Abnormal signal intensity within the left occipital condyle, likely an osseous metastasis given provided history. No significant extra osseous extension of tumor.  4. Possible additional osseous metastatic lesion within the C3 vertebral body. Further evaluation with dedicated MRI of the cervical spine suggested for complete evaluation. 5. No other MRI evidence for intracranial metastatic disease. 6. Mild to moderate chronic microvascular ischemic disease, mildly progressed relative to 2017. Electronically Signed   By: BJeannine BogaM.D.   On: 07/24/2016 23:06   Mr Lumbar Spine W/o Contrast  Result Date: 07/03/2016 CLINICAL DATA:  Low back and left hip pain for the last 6 weeks. EXAM: MRI LUMBAR SPINE WITHOUT CONTRAST TECHNIQUE: Multiplanar, multisequence MR imaging of the lumbar spine was performed. No intravenous contrast was administered. COMPARISON:  06/03/2016 radiographs FINDINGS: Segmentation: The lowest lumbar type non-rib-bearing vertebra is labeled as L5. Alignment:  No vertebral subluxation is observed. Vertebrae: Low T1 signal intensity lesions scattered in the lumbar spine suspicious for osseous metastatic disease. Among these is a 2.7 by 2.0 cm lesion along the left posterior inferior endplate of L1 which significantly expands the vertebral body posteriorly into the left neural foramen, and may be associated with local epidural tumor. This is accompanied by a 2.6 by 1.9 cm lesion in the spinous process at this level and other metastatic lesions also in the vertebral body. The similar lesion is are present in the L2 vertebral body, and in the L3 spinous process, in the L5 vertebral body and spinous process, and along the upper S1 margin. Several other smaller scattered lesions are also present. Neoplastic lesions with low T1 and high T2 signal are present in both iliac bones. Conus medullaris: Extends to the L1 level and appears normal. Paraspinal and  other soft tissues: No retroperitoneal adenopathy identified. Parapelvic renal cysts. Disc levels: T12-L1: Unremarkable. L1-2: The bony vertebral expansion/ tumor along the left posterior vertebral body is  questionably extending into the epidural space and causes mild left foraminal stenosis and mild displacement of the left L2 nerves in the lateral extraforaminal space. Expansile lytic lesion of the posterior elements. L2-3: Mild right foraminal stenosis and mild displacement of the left L2 nerve in the lateral extraforaminal space due to left lateral extraforaminal disc protrusion along with facet arthropathy and disc bulge. L3-4: Mild displacement of the right L3 nerve in the lateral extraforaminal space due to right lateral extraforaminal disc protrusion. Mild disc bulge. L4-5:  No impingement.  Mild disc bulge. L5-S1:  Unremarkable. IMPRESSION: 1. There are scattered expansile likely lytic lesions in the lumbar vertebra, sacrum, and iliac bones compatible with either multiple myeloma or extensive osseous metastatic disease. An expansile bony lesion with possible epidural involvement along the left posterior L1 vertebral body extends into the neural foramen and contributes to the left foraminal stenosis at L1- 2. Further workup for malignancy is recommended. 2. Lumbar spondylosis and degenerative disc disease also cause mild impingement at L2- 3 and L3-4. These results will be called to the ordering clinician or representative by the Radiologist Assistant, and communication documented in the PACS or zVision Dashboard. Electronically Signed   By: Van Clines M.D.   On: 07/03/2016 13:52   Ct Abdomen Pelvis W Contrast  Result Date: 07/15/2016 CLINICAL DATA:  Low back and left hip pain. Multiple bony lesions most consistent with metastatic disease or myeloma by prior MRI. EXAM: CT CHEST, ABDOMEN, AND PELVIS WITH CONTRAST TECHNIQUE: Multidetector CT imaging of the chest, abdomen and pelvis was performed following the standard protocol during bolus administration of intravenous contrast. CONTRAST:  100 ml ISOVUE-300 IOPAMIDOL (ISOVUE-300) INJECTION 61% COMPARISON:  None. MRI lumbar spine 07/03/2016. FINDINGS:  CT CHEST FINDINGS Cardiovascular: Heart size normal. No pericardial effusion. Calcific aortic atherosclerosis noted. Mediastinum/Nodes: There is mediastinal lymphadenopathy. Node just anterior to the carina eccentric to the right on image 42 measures 1.6 cm by 2.6 cm. Right hilar lymph node on image 48 measures 2.6 by 2.2 cm. Lungs/Pleura: Small right pleural effusion is identified. There is mild thickening and nodularity of the superior aspect of the pleura along right major fissure. No discrete pleural nodule is identified. Solid mass in the right lower lobe on image 80 measures 2.3 x 3.3 cm. Mild dependent atelectasis is seen on the left. Musculoskeletal: Multiple lytic lesions are identified in the thoracic spine. Largest lesion is in T6 where there is a pathologic compression fracture deformity with some bony retropulsion. No obvious stenosis. Lytic lesions are also identified in scattered ribs, see for example the posterior arc of the right fifth rib on image 29. CT ABDOMEN PELVIS FINDINGS Hepatobiliary: No focal liver abnormality is seen. No gallstones, gallbladder wall thickening, or biliary dilatation. Pancreas: Unremarkable. No pancreatic ductal dilatation or surrounding inflammatory changes. Spleen: Normal in size without focal abnormality. Adrenals/Urinary Tract: Parapelvic renal cysts are seen and more prominent on the left. No solid renal lesion is identified. Ureters and urinary bladder appear normal. The adrenal glands are normal in appearance. Stomach/Bowel: Sigmoid diverticulosis without diverticulitis is noted. The colon is otherwise unremarkable. Stomach, small bowel and appendix appear normal. Vascular/Lymphatic: No significant vascular findings are present. No enlarged abdominal or pelvic lymph nodes. Reproductive: Prostate is unremarkable. Other: No abdominal wall hernia or abnormality. No abdominopelvic ascites. Musculoskeletal: Lytic lesions are identified in  the spine as seen on prior MRI,  most conspicuous in L1 where there is epidural tumor present. Large lytic lesions are also seen in the posterior right ilium and sacrum. No fracture. 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 Pathologic T6 fracture with bony retropulsion but no obvious central canal stenosis. Evaluation for epidural tumor is limited on CT scan. If the patient has myelopathic symptoms, MRI of the thoracic spine with and without contrast could be used for further evaluation. Calcific aortic and coronary atherosclerosis. Sigmoid diverticulosis without diverticulitis. Electronically Signed   By: Inge Rise M.D.   On: 07/15/2016 10:36     ELGERGAWY, DAWOOD M.D on 07/25/2016 at 12:46 PM  Between 7am to 7pm - Pager - [phone]  After 7pm go to [email or web address] - password [facility]  [facility] -  Office  [phone]

## 2016-07-25 NOTE — Progress Notes (Signed)
STROKE TEAM PROGRESS NOTE   HISTORY OF PRESENT ILLNESS (per record) Warren Odonnell is a 71 y.o. male with diagnosis of lung cancer with bone metastasis. He has been having pain, and was taking increasing doses of hydrocodone when on Thursday he began to have hallucinations. He saw Warren Odonnell on Friday where he complained of continued pain due to his bone metastasis. He was therefore started on MS Contin and Decadron following which the patient became much more confused and hallucinations increased considerably.  Due to this, he was referred to the emergency department where an MRI of the brain was performed which shows a small likely incidental ischemic infarct.  Family also notes, that he has not been walking very well and he complains of double vision for the past 4 days (though unclear if this is because he just got bifocals).  He has not been eating well at all, and when he eats, he eats only carbohydrates.  LKW: Unclear tpa given?: no, unclear time of onset   SUBJECTIVE (INTERVAL HISTORY) His wife is at bedside. 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 Wife cried, Warren Odonnell is a new diagnosis and they just saw Warren Odonnell last week for the first time.   OBJECTIVE Temp:  [97.3 F (36.3 C)-98.5 F (36.9 C)] 98.5 F (36.9 C) (02/18 0430) Pulse Rate:  [73-106] 73 (02/18 0622) Cardiac Rhythm: Normal sinus rhythm (02/18 0244) Resp:  [18] 18 (02/18 0622) BP: (121-171)/(71-100) 121/72 (02/18 0622) SpO2:  [87 %-99 %] 99 % (02/18 0622) FiO2 (%):  [21 %] 21 % (02/18 0218) Weight:  [76.4 kg (168 lb 8 oz)-77.6 kg (171 lb)] 76.4 kg (168 lb 8 oz) (02/18 0223)  CBC:  Recent Labs Lab 07/23/16 0854 07/24/16 1933  WBC 11.4* 13.4*  NEUTROABS 8.9* 12.3*  HGB 12.3* 11.4*  HCT 35.8* 34.4*  MCV 91.6 92.0  PLT 503* 542*    Basic Metabolic Panel:  Recent Labs Lab 07/23/16 0854 07/24/16 1933  NA 139 139  K 3.7 4.2  CL  --  106  CO2  22 24  GLUCOSE 131 143*  BUN 23.0 28*  CREATININE 1.0 1.03  CALCIUM 10.3 9.5    Lipid Panel:    Component Value Date/Time   CHOL 164 07/25/2016 0457   TRIG 85 07/25/2016 0457   HDL 50 07/25/2016 0457   CHOLHDL 3.3 07/25/2016 0457   VLDL 17 07/25/2016 0457   LDLCALC 97 07/25/2016 0457   HgbA1c: No results found for: HGBA1C Urine Drug Screen:    Component Value Date/Time   LABOPIA NONE DETECTED 05/09/2016 1107   COCAINSCRNUR NONE DETECTED 05/09/2016 1107   LABBENZ NONE DETECTED 05/09/2016 1107   AMPHETMU NONE DETECTED 05/09/2016 1107   THCU NONE DETECTED 05/09/2016 1107   LABBARB NONE DETECTED 05/09/2016 1107      IMAGING  Mr Brain W Wo Contrast 07/24/2016 1. 9 mm acute ischemic small vessel white matter infarct within the subcortical/deep white matter of the right parietal lobe. No associated hemorrhage or mass effect.  2. No other acute intracranial process identified.  3. Abnormal signal intensity within the left occipital condyle, likely an osseous metastasis given provided history. No significant extra osseous extension of tumor.  4. Possible additional osseous metastatic lesion within the C3 vertebral body. Further evaluation with dedicated MRI of the cervical spine suggested for complete evaluation.  5. No other MRI evidence for intracranial metastatic disease.  6. Mild to moderate chronic microvascular ischemic  disease, mildly progressed relative to 2017.   Physical exam: Exam: Gen: NAD, conversant, well nourised, obese, well groomed                     CV: RRR, no MRG. No Carotid Bruits. No peripheral edema, warm, nontender Eyes: Conjunctivae clear without exudates or hemorrhage  Neuro: Detailed Neurologic Exam  Speech:    Speech is normal; fluent and spontaneous with normal comprehension.  Cognition:    The patient is oriented to person, place, and time;  Cranial Nerves:    The pupils are equal, round, and reactive to light. Visual fields are full to finger  confrontation. Extraocular movements are intact. Trigeminal sensation is intact and the muscles of mastication are normal. The face is symmetric. The palate elevates in the midline. Hearing intact. Voice is normal. Shoulder shrug is normal. The tongue has normal motion without fasciculations.   Coordination:    Normal finger to nose.  Motor Observation:    No asymmetry, no atrophy, and no involuntary movements noted. Tone:    Normal muscle tone.     Strength:    Strength is V/V in the upper and lower limbs.      Sensation: intact to LT      ASSESSMENT/PLAN Warren Odonnell is a 71 y.o. male with history of lung cancer with bone metastasis presenting with hallucinations and confusion after starting opiates and decadron for bone mets.  He did not receive IV t-PA due to unclear time of onset.  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 Yes; Fluid consistency: Thin  aspirin 81 mg daily prior to admission, now on aspirin 325 mg daily  Patient counseled to be compliant with his antithrombotic medications  Ongoing aggressive stroke risk factor management  Therapy recommendations: pending  Disposition: Pending  Hypertension  Stable  Permissive hypertension (OK if < 220/120) but gradually normalize in 5-7 days  Long-term BP goal normotensive  Hyperlipidemia  Home meds:  No lipid lowering medications prior to admission  LDL 97, goal < 70  Add lipitor 20 mg daily  Continue statin at discharge    Other Stroke Risk Factors  Advanced age  Family hx stroke (sister)   Other Active  Problems  Leukocytosis - on Decadron  Elevated platelets - 542  Bony metastasis   Per radiology - Further evaluation with dedicated MRI of the cervical spine suggested for complete  evaluation. Ordered.   Pain medications - probably contributed to hallucinations and confusion  Recommend consulting oncology inpatient to talk to patient and wife, this is a new diagnosis (just met with Warren Odonnell last week) and there appears to be more bone mets than they knew about.  Hospital day # 0  Personally examined patient and images, and have participated in and made any corrections needed to history, physical, neuro exam,assessment and plan as stated above.  I have personally obtained the history, evaluated lab date, reviewed imaging studies and agree with radiology interpretations.    Sarina Ill, MD Stroke Neurology [facility]

## 2016-07-25 NOTE — ED Notes (Signed)
[facility] called for transport of pt to [facility]

## 2016-07-25 NOTE — Progress Notes (Signed)
**  Preliminary report by tech**  Carotid artery duplex complete. Findings are consistent with a 1-39 percent stenosis involving the right internal carotid artery and the left internal carotid artery. The vertebral arteries demonstrate antegrade flow.  07/25/16 4:11 PM Warren Odonnell RVT

## 2016-07-25 NOTE — Evaluation (Signed)
Physical Therapy Evaluation Patient Details Name: Warren Odonnell MRN: [id number] DOB: 08-11-1945 Today's Date: 07/25/2016   History of Present Illness  Admitted with AMS, recent difficulty walking; new diagnosis of lung Ca with bony mets (painful); imaging showing small R parietal CVA  has a past medical history of Bone metastases ([place]) (07/23/2016); Cancer associated pain (07/24/2016); Glaucoma; Testicular swelling; and Weight loss (07/24/2016).    Clinical Impression  Pt admitted with above diagnosis. Pt currently with functional limitations due to the deficits listed below (see PT Problem List). Presents with decr coordination and balance as well as gait deviations;  Pt will benefit from skilled PT to increase their independence and safety with mobility to allow discharge to the venue listed below.       Follow Up Recommendations Home health PT;Supervision/Assistance - 24 hour    Equipment Recommendations  Rolling walker with 5" wheels;3in1 (PT)    Recommendations for Other Services OT consult     Precautions / Restrictions Precautions Precautions: Fall      Mobility  Bed Mobility Overal bed mobility: Needs Assistance Bed Mobility: Supine to Sit     Supine to sit: Min guard (without physical contact)     General bed mobility comments: Dependent on momentum to sit up  Transfers Overall transfer level: Needs assistance Equipment used: 1 person hand held assist Transfers: Sit to/from Stand Sit to Stand: Min assist         General transfer comment: steady assist to stand from bed and low commode  Ambulation/Gait Ambulation/Gait assistance: Min assist;Min guard Ambulation Distance (Feet): 110 Feet Assistive device: None;1 person hand held assist;Rolling walker (2 wheeled) Gait Pattern/deviations: Step-through pattern;Wide base of support;Decreased step length - right;Decreased step length - left;Decreased stride length Gait velocity: slowed Gait velocity  interpretation: Below normal speed for age/gender General Gait Details: Noting incr step width/base of support, with much less efficient gait pattern when Warren Odonnell walks wihout UE support; more efficient pattern with RW  Stairs            Wheelchair Mobility    Modified Rankin (Stroke Patients Only) Modified Rankin (Stroke Patients Only) Pre-Morbid Rankin Score: Slight disability (This measurement is clouded by recent dx of Ca with mets) Modified Rankin: Moderate disability     Balance Overall balance assessment: Needs assistance Sitting-balance support: Feet supported Sitting balance-Leahy Scale: Good       Standing balance-Leahy Scale: Fair                               Pertinent Vitals/Pain Pain Assessment: Faces Faces Pain Scale: Hurts a little bit Pain Location: Low back pain after amb Pain Descriptors / Indicators: Aching Pain Intervention(s): Monitored during session    Home Living Family/patient expects to be discharged to:: Private residence Living Arrangements: Spouse/significant other Available Help at Discharge: Family;Available 24 hours/day Type of Home: House Home Access: Stairs to enter   [employer] of Steps: 1 (small threshold) Home Layout: One level Home Equipment: None      Prior Function Level of Independence: Independent         Comments: Retired from [employer]        Extremity/Trunk Assessment   Upper Extremity Assessment Upper Extremity Assessment: Defer to OT evaluation    Lower Extremity Assessment Lower Extremity Assessment: Generalized weakness (noting tremor in all extremities)       Communication   Communication: No  difficulties  Cognition Arousal/Alertness: Awake/alert Behavior During Therapy: WFL for tasks assessed/performed Overall Cognitive Status: Within Functional Limits for tasks assessed                      General Comments General comments  (skin integrity, edema, etc.): Session conducted on Room Air; O2 sats decr to 90% with amb on Room Air; incr back to 94% with seated rest on Room Air; RN notified    Exercises     Assessment/Plan    PT Assessment Patient needs continued PT services  PT Problem List Decreased strength;Decreased activity tolerance;Decreased balance;Decreased mobility;Decreased coordination;Decreased knowledge of use of DME;Decreased safety awareness;Decreased knowledge of precautions;Pain;Cardiopulmonary status limiting activity          PT Treatment Interventions DME instruction;Gait training;Stair training;Functional mobility training;Therapeutic activities;Therapeutic exercise;Balance training;Patient/family education    PT Goals (Current goals can be found in the Care Plan section)  Acute Rehab PT Goals Patient Stated Goal: Did not state, but seemed pleased to be OOB PT Goal Formulation: With patient Time For Goal Achievement: 08/08/16 Potential to Achieve Goals: Good    Frequency Min 3X/week   Barriers to discharge        Co-evaluation               End of Session Equipment Utilized During Treatment: Gait belt Activity Tolerance: Patient tolerated treatment well Patient left: in chair;with call bell/phone within reach;with chair alarm set;with family/visitor present Nurse Communication: Mobility status    Functional Assessment Tool Used: Clinical Judgement Functional Limitation: Mobility: Walking and moving around Mobility: Walking and Moving Around Current Status [phone]): At least 20 percent but less than 40 percent impaired, limited or restricted Mobility: Walking and Moving Around Goal Status [phone]): At least 1 percent but less than 20 percent impaired, limited or restricted    Time: 1008-1039 PT Time Calculation (min) (ACUTE ONLY): 31 min   Charges:   PT Evaluation $PT Eval Moderate Complexity: 1 Procedure PT Treatments $Gait Training: 8-22 mins   PT G Codes:   PT  G-Codes **NOT FOR INPATIENT CLASS** Functional Assessment Tool Used: Clinical Judgement Functional Limitation: Mobility: Walking and moving around Mobility: Walking and Moving Around Current Status ([id number]): At least 20 percent but less than 40 percent impaired, limited or restricted Mobility: Walking and Moving Around Goal Status [phone]): At least 1 percent but less than 20 percent impaired, limited or restricted    Colletta [place] 07/25/2016, 12:33 PM  Roney Marion, [place] Pager [phone] Office [phone]

## 2016-07-25 NOTE — H&P (Signed)
History and Physical  Patient Name: Warren Odonnell     MWN:[id number]    DOB: 07/04/1945    DOA: 07/24/2016 PCP: Shirline Frees, MD   Patient coming from: Home     Chief Complaint: Hallucinations  HPI: Warren Odonnell is a 71 y.o. male with a past medical history significant for newly diagnosed lung mass and widespread bone metastases who presents with delirium.  Caveat that patient is a little tangential and disorganized, so most history collected from chart and wife/daughter at the bedside.  The patient was just recently diagnosed with suspected metastatic lung cancer. He is not smoker, and has been very active, recently retired, and started to have some upper back pain in November. He was sent to an orthopedist ordered an MRI, which showed numerous osteolytic bone lesions, suspected metastases. Follow-up CT of the chest abdomen and pelvis showed a 3 cm lung mass, and he was referred to oncology, whom he just saw for the first time 2 days ago on Friday.  At that appointment on Friday, the patient's hydrocodone 20 mg tablets, which she had been taking several times per day, were changed to MS Contin 30 mg twice a day with Norco 5-325 PRN as well as Decadron TID.  He began these Friday afternoon, and then around that time, worsening overnight, the patient began to hallucinate.  First, Friday afternoon at one point, he told his wife he saw "a box flying out of the window".  At 2AM overnight Friday, he woke up and went into the bathroom to "put his contacts in" and was combative when his wife tried to redirect him.  During the day Saturday, his daughter noticed that he said "don't touch that water bottle" pointing at a shelf with nothing on it, and later was grasping something in the air that wasn't there.  There was no observed weaknses, fall, speech change, facial, asymmetry, numbness, seizure, fever, or vision changes (other than seeing "colors" for seconds at a time), nor does the patient endorse  these symptoms.  Tonight, they called his Oncologist's office, who recommended he be urgently evaluated.  ED course: -Afebrile, heart rate 106, respirations and pulse ox normal, BP 140/100 -Na 139, K 4.2, Cr 1.03, WBC 13.4K, Hgb 11.4 normocytic -Case was discussed with Oncology who recommended preceding with planned MR brain -MR brain with and without contrast was obtained that showed a new subcentimeter R parietal white matter infarct, no new metastases -The case was discussed with Neurology who recommended admission to [facility] for stroke workup, likely TEE     Review of systems:  Review of Systems  Constitutional: Negative for chills and fever.  Neurological: Positive for tremors. Negative for dizziness, tingling, sensory change, speech change, focal weakness, seizures, loss of consciousness and headaches.  Psychiatric/Behavioral: Positive for hallucinations.  All other systems reviewed and are negative.        Past Medical History:  Diagnosis Date  . Bone metastases ([place]) 07/23/2016  . 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 Glaucoma   . Testicular swelling   . Weight loss 07/24/2016    Past Surgical History:  Procedure Laterality Date  . HERNIA REPAIR  1984  . testicular torsion Left     Social History: Patient lives with his wife.  Patient walks unassisted.  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  He is not a smoker.  He is from [facility].    No Known Allergies  Family history: History reviewed. No pertinent  family history.   Prior to Admission medications   Medication Sig Start Date End Date Taking? Authorizing Provider  bimatoprost (LUMIGAN) 0.01 % SOLN Place 1 drop into both eyes at bedtime.   Yes Historical Provider, MD  dexamethasone (DECADRON) 4 MG tablet Take 1 tablet (4 mg total) by mouth 3 (three) times daily. 07/23/16  Yes Curt Bears, MD  dorzolamide (TRUSOPT) 2 % ophthalmic solution Place 1 drop into both eyes 2 (two) times daily.     Yes Historical Provider, MD  esomeprazole (NEXIUM) 20 MG capsule Take 20 mg by mouth daily.    Yes Historical Provider, MD  fluconazole (DIFLUCAN) 100 MG tablet Take 1 tablet (100 mg total) by mouth daily. 07/23/16  Yes Curt Bears, MD  HYDROcodone-acetaminophen (NORCO/VICODIN) 5-325 MG tablet Take 1 tablet by mouth 3 (three) times daily as needed for moderate pain.   Yes Historical Provider, MD  morphine (MS CONTIN) 30 MG 12 hr tablet Take 1 tablet (30 mg total) by mouth every 12 (twelve) hours. 07/23/16  Yes Curt Bears, MD  Multiple Vitamin (MULTIVITAMIN WITH MINERALS) TABS tablet Take 1 tablet by mouth daily.   Yes Historical Provider, MD  Multiple Vitamins-Minerals (PRESERVISION AREDS 2+MULTI VIT) CAPS Take 1 capsule by mouth 2 (two) times daily.   Yes Historical Provider, MD  ondansetron (ZOFRAN) 8 MG tablet Take 8 mg by mouth every 8 (eight) hours as needed for nausea or vomiting.   Yes Historical Provider, MD  prochlorperazine (COMPAZINE) 10 MG tablet Take 1 tablet (10 mg total) by mouth every 6 (six) hours as needed for nausea or vomiting. 07/23/16  Yes Curt Bears, MD  sildenafil (REVATIO) 20 MG tablet Take 40-100 mg by mouth as needed (for ED).   Yes Historical Provider, MD  triamcinolone cream (KENALOG) 0.1 % Apply 1 application topically 2 (two) times daily as needed (for rash).    Yes Historical Provider, MD     Physical Exam: BP 166/87   Pulse 84   Temp 98.3 F (36.8 C)   Resp 18   Ht '5\' 11"'$  (1.803 m)   Wt 77.6 kg (171 lb)   SpO2 90%   BMI 23.85 kg/m  General appearance: Well-developed, elderly adult male, awake, answering questions, no obvious distress.   Eyes: Anicteric, conjunctiva pink, lids and lashes normal. PERRL.    ENT: No nasal deformity, discharge, epistaxis.  Hearing normal. OP moist without lesions.   Dentition normal. Lymph: No cervical, supraclavicular or axillary lymphadenopathy. Skin: Warm and dry.  No jaundice.  No suspicious rashes or  lesions. Cardiac: RRR, nl S1-S2, no murmurs appreciated.  Capillary refill is brisk.  JVP normal.  No LE edema.  Radial and DP pulses 2+ and symmetric.  No carotid bruits. Respiratory: Normal respiratory rate and rhythm.  CTAB without rales or wheezes. GI: Abdomen soft without rigidity.  No TTP. No ascites, distension, no hepatosplenomegaly.   MSK: No deformities or effusions. Neuro: Pupils are 4 mm and reactive to 3 mm. Extraocular movements are intact, without nystagmus. Cranial nerve 5 is within normal limits. Cranial nerve 7 is symmetrical. Cranial nerve 8 is within normal limits. Cranial nerves 9 and 10 reveal equal palate elevation. Cranial nerve 11 reveals sternocleidomastoid strong. Cranial nerve 12 is midline. I do not note a deficit in motor strength testing in the upper and lower extremities bilaterally with normal motor, tone and bulk. Romberg maneuver is negative for pathology. Finger-to-nose testing is within normal limits. Speech is fluent. Naming is grossly intact. Attention span  and concentration seem off, he has trouble engaging with conversation between me and wife at times.     Psych: The patient is place and person. When asked what time it is, day or night, states "it's 24 hours."  Makes odd statements, like explains that he has new glasses/bifocals "to accommodate the pain".  Wife/daughter and I cannot understand some of these statements.  No evidence of aural or visual hallucinations during my exam.    Labs on Admission:  I have personally reviewed following labs and imaging studies: CBC:  Recent Labs Lab 07/23/16 0854 07/24/16 1933  WBC 11.4* 13.4*  NEUTROABS 8.9* 12.3*  HGB 12.3* 11.4*  HCT 35.8* 34.4*  MCV 91.6 92.0  PLT 503* 124*   Basic Metabolic Panel:  Recent Labs Lab 07/23/16 0854 07/24/16 1933  NA 139 139  K 3.7 4.2  CL  --  106  CO2 22 24  GLUCOSE 131 143*  BUN 23.0 28*  CREATININE 1.0 1.03  CALCIUM 10.3 9.5   GFR: Estimated  Creatinine Clearance: 72.1 mL/min (by C-G formula based on SCr of 1.03 mg/dL). Liver Function Tests:  Recent Labs Lab 07/23/16 0854 07/24/16 1933  AST 32 34  ALT 48 43  ALKPHOS 121 117  BILITOT 0.65 0.6  PROT 6.7 6.8  ALBUMIN 2.9* 3.1*   No results for input(s): LIPASE, AMYLASE in the last 168 hours. No results for input(s): AMMONIA in the last 168 hours. Coagulation Profile: No results for input(s): INR, PROTIME in the last 168 hours. Cardiac Enzymes: No results for input(s): CKTOTAL, CKMB, CKMBINDEX, TROPONINI in the last 168 hours. BNP (last 3 results) No results for input(s): PROBNP in the last 8760 hours. HbA1C: No results for input(s): HGBA1C in the last 72 hours. CBG: No results for input(s): GLUCAP in the last 168 hours. Lipid Profile: No results for input(s): CHOL, HDL, LDLCALC, TRIG, CHOLHDL, LDLDIRECT in the last 72 hours. Thyroid Function Tests: No results for input(s): TSH, T4TOTAL, FREET4, T3FREE, THYROIDAB in the last 72 hours. Anemia Panel: No results for input(s): VITAMINB12, FOLATE, FERRITIN, TIBC, IRON, RETICCTPCT in the last 72 hours. Sepsis Labs: Invalid input(s): PROCALCITONIN, LACTICIDVEN No results found for this or any previous visit (from the past 240 hour(s)).    Radiological Exams on Admission: Personally reviewed MR report: Mr Warren Odonnell Contrast  Result Date: 07/24/2016 CLINICAL DATA:  Initial evaluation for acute altered mental status. History of lung cancer with osseous metastases. EXAM: MRI HEAD WITHOUT AND WITH CONTRAST TECHNIQUE: Multiplanar, multiecho pulse sequences of the brain and surrounding structures were obtained without and with intravenous contrast. CONTRAST:  26m MULTIHANCE GADOBENATE DIMEGLUMINE 529 MG/ML IV SOLN COMPARISON:  Prior MRI from 05/13/2016. FINDINGS: Brain: Study mildly degraded by motion artifact. Mild diffuse prominence of the CSF containing spaces is compatible with generalized age-related cerebral atrophy. Patchy  and confluent T2/FLAIR hyperintensity within the periventricular and deep white matter both cerebral hemispheres, most consistent with chronic microvascular ischemic disease, mildly progressed relative to previous. There is a 9 mm focus of linear restricted diffusion within the subcortical/deep white matter of the right parietal lobe, compatible with a small acute ischemic small vessel infarct (series 4, image 33). No associated mass effect or hemorrhage. No other evidence for acute for subacute ischemia. Gray-white matter differentiation otherwise maintained. No evidence for acute or chronic intracranial hemorrhage. No other evidence for chronic infarction. No mass lesion, midline shift or mass effect. No hydrocephalus. No extra-axial fluid collection. Major dural sinuses are grossly patent. No abnormal  enhancement. Pituitary gland and suprasellar region within normal limits. Vascular: Major intracranial vascular flow voids are maintained. Skull and upper cervical spine: Abnormal signal intensity within the left occipital condyle with associated enhancement, likely an osseous metastatic lesion (series 11, image 7). Possible additional osseous metastatic lesion within the C3 vertebral body (series 13, image 14), not well evaluated on this exam. No significant extraosseous tumor. No stenosis within the upper cervical spine. No calvarial metastatic lesions identified. Sinuses/Orbits: Globes and orbital soft tissues demonstrate no acute abnormality. Axial myopia noted. Paranasal sinuses are clear. Small bilateral mastoid effusions, slightly larger on the left. Inner ear structures grossly unremarkable. IMPRESSION: 1. 9 mm acute ischemic small vessel white matter infarct within the subcortical/deep white matter of the right parietal lobe. No associated hemorrhage or mass effect. 2. No other acute intracranial process identified. 3. Abnormal signal intensity within the left occipital condyle, likely an osseous metastasis  given provided history. No significant extra osseous extension of tumor. 4. Possible additional osseous metastatic lesion within the C3 vertebral body. Further evaluation with dedicated MRI of the cervical spine suggested for complete evaluation. 5. No other MRI evidence for intracranial metastatic disease. 6. Mild to moderate chronic microvascular ischemic disease, mildly progressed relative to 2017. Electronically Signed   By: Jeannine Boga M.D.   On: 07/24/2016 23:06         Assessment/Plan Principal Problem:   Stroke [facility]) Active Problems:   Lung mass   Bone metastases (HCC)   Cancer associated pain   Delirium  1. Acute Stroke:  This is new.  May be incidental.  MRI shows subcentimeter infarct, white-gray matter jcn, ?embolic.   -Admit to telemetry -Neuro checks, NIHSS per protocol -Daily aspirin 325 mg -Lipids, hemoglobin A1c -Carotid doppler ordered -Echocardiogram ordered -PT/OT/SLP consultation -Consult to Neurology, appreciate recommendations   2. Delirium:  This is new.  This actually is the primary complaint, not clear that this is related to his infarct.  Timing seems to have gotten much worse with dexamethasone and morphine.  Elevated BUN-creatinine.   -Gentle fluids  -Check UA -Decrease morphine to 15 mg BID (may need to go back up), continue PRN Norco -Decrease decadron to BID  3. Suspected lung cancer: Patient has radiation therapy appointment with Dr. Tammi Odonnell on Monday.  Has PET scan appointment at [facility] on Thursday.  Message sent to Dr. Earlie Odonnell informing him of admission. -Continue Decadron, Morphine, Norco, Diflucan       DVT prophylaxis: Lovenox  Code Status: FULL  Family Communication: Daughter and wife at bedside  Disposition Plan: Anticipate Stroke work up as above and consult to ancillary services.  Expect discharge within 1-2 days. Consults called: Neurology, Dr. Leonel Odonnell has seen patient. Admission status: Telemetry, OBS  status  Core measures: -VTE prophylaxis ordered at time of admission -Aspirin ordered at admission -Atrial fibrillation: not known -tPA not given because of unknown timing of event -Dysphagia screen ordered in ER -Lipids ordered -PT eval ordered    Medical decision making: Patient seen at 1:43 AM on 07/25/2016.  The patient was discussed with Dr. Leonel Odonnell and Dr. Leonette Odonnell. What exists of the patient's chart was reviewed in depth and summarized above.  Clinical condition: stable.       Edwin Dada [facility] Pager [phone]

## 2016-07-25 NOTE — Evaluation (Signed)
Occupational Therapy Evaluation and Discharge Patient Details Name: Warren Odonnell MRN: [id number] DOB: [birth year] Today's Date: 07/25/2016    History of Present Illness Admitted with AMS, recent difficulty walking; new diagnosis of lung Ca with bony mets (painful); imaging showing small R parietal CVA   Clinical Impression   This 71 yo male admitted with above presents to acute OT with decreased balance when up on his feet with RW making this safer. Recommendation made to pt and dtr is that anytime he is up on is feet that someone needs to be with him--they verbalized understanding.    Follow Up Recommendations  No OT follow up;Supervision/Assistance - 24 hour    Equipment Recommendations  None recommended by OT       Precautions / Restrictions Precautions Precautions: Fall Restrictions Weight Bearing Restrictions: No      Mobility Bed Mobility Overal bed mobility: Modified Independent                Transfers Overall transfer level: Needs assistance Equipment used: Rolling walker (2 wheeled) Transfers: Sit to/from Stand Sit to Stand: Min guard              Balance Overall balance assessment: Needs assistance Sitting-balance support: Feet supported;No upper extremity supported Sitting balance-Leahy Scale: Good     Standing balance support: Bilateral upper extremity supported Standing balance-Leahy Scale: Poor Standing balance comment: RW much safer                            ADL Overall ADL's : Needs assistance/impaired Eating/Feeding: Independent;Sitting   Grooming: Min guard;Standing   Upper Body Bathing: Set up;Sitting   Lower Body Bathing: Min guard;Sit to/from stand   Upper Body Dressing : Supervision/safety;Set up;Sitting   Lower Body Dressing: Min guard;Sit to/from stand   Toilet Transfer: Min guard;Ambulation;RW;Regular Museum/gallery exhibitions officer and Hygiene: Min guard;Sit to/from stand   Tub/ Shower  Transfer: Min guard;Ambulation;Rolling walker;Grab bars      Pt reports that he stands to take a shower and is always holding onto grab bar that is the shower with at least one hand--I recommended that he may want to consider sitting to take a shower from a safety standpoint.      Vision Additional Comments: Pt reports he does have perpherial vision issues on right pre-morbidly          Pertinent Vitals/Pain Pain Assessment: 0-10 Faces Pain Scale: Hurts a little bit Pain Location: mid back pain after up and about Pain Descriptors / Indicators: Aching;Sore Pain Intervention(s): Monitored during session;Repositioned;Heat applied     Hand Dominance Right   Extremity/Trunk Assessment Upper Extremity Assessment Upper Extremity Assessment: Overall WFL for tasks assessed           Communication Communication Communication: No difficulties   Cognition Arousal/Alertness: Awake/alert Behavior During Therapy: WFL for tasks assessed/performed Overall Cognitive Status: Within Functional Limits for tasks assessed                                Home Living Family/patient expects to be discharged to:: Private residence Living Arrangements: Spouse/significant other Available Help at Discharge: Family;Available 24 hours/day Type of Home: House Home Access: Stairs to enter [employer] of Steps: 1 (small threshold)   Home Layout: One level     Bathroom Shower/Tub: Occupational psychologist: Standard     Home Equipment:  Shower seat - built in;Grab bars - tub/shower          Prior Functioning/Environment Level of Independence: Independent        Comments: Retired from IT consultant goals can be found in the care plan section) Acute Rehab OT Goals Patient Stated Goal: to stay up in recliner   OT Frequency:                End of Session Equipment Utilized During Treatment: Gait belt;Rolling  walker  Activity Tolerance: Patient tolerated treatment well Patient left: in chair;with call bell/phone within reach;with chair alarm set;with family/visitor present   Time: 1761-6073 OT Time Calculation (min): 28 min Charges:  OT General Charges $OT Visit: 1 Procedure OT Evaluation $OT Eval Moderate Complexity: 1 Procedure OT Treatments $Self Care/Home Management : 8-22 mins G-Codes: OT G-codes **NOT FOR INPATIENT CLASS** Functional Assessment Tool Used: Clinical observation Functional Limitation: Self care Self Care Current Status (X1062): At least 1 percent but less than 20 percent impaired, limited or restricted Self Care Goal Status (I9485): At least 1 percent but less than 20 percent impaired, limited or restricted Self Care Discharge Status [phone]): At least 1 percent but less than 20 percent impaired, limited or restricted  Almon Register [phone] 07/25/2016, 3:29 PM

## 2016-07-26 ENCOUNTER — Telehealth: Payer: Self-pay | Admitting: *Deleted

## 2016-07-26 ENCOUNTER — Ambulatory Visit: Payer: Medicare Other

## 2016-07-26 ENCOUNTER — Inpatient Hospital Stay (HOSPITAL_COMMUNITY): Payer: Medicare Other

## 2016-07-26 ENCOUNTER — Ambulatory Visit
Admission: RE | Admit: 2016-07-26 | Discharge: 2016-07-26 | Disposition: A | Discharge status: Home / Self Care | Payer: Medicare Other | Source: Ambulatory Visit | Attending: Radiation Oncology | Admitting: Radiation Oncology

## 2016-07-26 ENCOUNTER — Other Ambulatory Visit (HOSPITAL_COMMUNITY): Payer: Medicare Other

## 2016-07-26 DIAGNOSIS — M4802 Spinal stenosis, cervical region: Secondary | ICD-10-CM | POA: Insufficient documentation

## 2016-07-26 DIAGNOSIS — K5730 Diverticulosis of large intestine without perforation or abscess without bleeding: Secondary | ICD-10-CM | POA: Insufficient documentation

## 2016-07-26 DIAGNOSIS — C7951 Secondary malignant neoplasm of bone: Secondary | ICD-10-CM

## 2016-07-26 DIAGNOSIS — C3491 Malignant neoplasm of unspecified part of right bronchus or lung: Secondary | ICD-10-CM | POA: Insufficient documentation

## 2016-07-26 DIAGNOSIS — I6300 Cerebral infarction due to thrombosis of unspecified precerebral artery: Secondary | ICD-10-CM

## 2016-07-26 LAB — BASIC METABOLIC PANEL
ANION GAP: 6 (ref 5–15)
BUN: 21 mg/dL — ABNORMAL HIGH (ref 6–20)
CHLORIDE: 105 mmol/L (ref 101–111)
CO2: 26 mmol/L (ref 22–32)
CREATININE: 0.98 mg/dL (ref 0.61–1.24)
Calcium: 9.6 mg/dL (ref 8.9–10.3)
GFR calc non Af Amer: >60 mL/min (ref >60)
Glucose, Bld: 121 mg/dL — ABNORMAL HIGH (ref 65–99)
Potassium: 5.1 mmol/L (ref 3.5–5.1)
SODIUM: 137 mmol/L (ref 135–145)

## 2016-07-26 LAB — CBC
HCT: 36.4 % — ABNORMAL LOW (ref 39.0–52.0)
HEMOGLOBIN: 11.5 g/dL — AB (ref 13.0–17.0)
MCH: 29.9 pg (ref 26.0–34.0)
MCHC: 31.6 g/dL (ref 30.0–36.0)
MCV: 94.8 fL (ref 78.0–100.0)
Platelets: 558 10*3/uL — ABNORMAL HIGH (ref 150–400)
RBC: 3.84 MIL/uL — AB (ref 4.22–5.81)
RDW: 13.5 % (ref 11.5–15.5)
WBC: 12.4 10*3/uL — AB (ref 4.0–10.5)

## 2016-07-26 LAB — HEMOGLOBIN A1C
HEMOGLOBIN A1C: 5.2 % (ref 4.8–5.6)
MEAN PLASMA GLUCOSE: 103 mg/dL

## 2016-07-26 MED ORDER — AMLODIPINE BESYLATE 5 MG PO TABS
5.0000 mg | ORAL_TABLET | Freq: Every day | ORAL | Status: DC
  Administered 2016-07-26: 5 mg via ORAL
  Filled 2016-07-26: qty 1

## 2016-07-26 MED ORDER — GADOBENATE DIMEGLUMINE 529 MG/ML IV SOLN
15.0000 mL | Freq: Once | INTRAVENOUS | Status: AC | PRN
  Administered 2016-07-26: 15 mL via INTRAVENOUS

## 2016-07-26 MED ORDER — PANTOPRAZOLE SODIUM 40 MG PO TBEC
40.0000 mg | DELAYED_RELEASE_TABLET | Freq: Every day | ORAL | Status: DC
  Administered 2016-07-26 – 2016-07-28 (×3): 40 mg via ORAL
  Filled 2016-07-26 (×3): qty 1

## 2016-07-26 MED ORDER — MAGNESIUM CITRATE PO SOLN
0.5000 | Freq: Once | ORAL | Status: AC
  Administered 2016-07-26: 0.5 via ORAL
  Filled 2016-07-26: qty 296

## 2016-07-26 MED ORDER — POLYETHYLENE GLYCOL 3350 17 G PO PACK
17.0000 g | PACK | Freq: Two times a day (BID) | ORAL | Status: DC
  Administered 2016-07-26 – 2016-07-27 (×3): 17 g via ORAL
  Filled 2016-07-26 (×4): qty 1

## 2016-07-26 MED ORDER — ALUM & MAG HYDROXIDE-SIMETH 200-200-20 MG/5ML PO SUSP
30.0000 mL | Freq: Four times a day (QID) | ORAL | Status: DC | PRN
  Filled 2016-07-26: qty 30

## 2016-07-26 MED ORDER — SENNOSIDES-DOCUSATE SODIUM 8.6-50 MG PO TABS
2.0000 | ORAL_TABLET | Freq: Two times a day (BID) | ORAL | Status: DC
  Administered 2016-07-26 – 2016-07-28 (×5): 2 via ORAL
  Filled 2016-07-26 (×5): qty 2

## 2016-07-26 NOTE — Progress Notes (Signed)
Vascular Ultrasound Transcranial Doppler has been completed.   07/26/2016 4:47 PM Maudry Mayhew, BS, RVT, RDCS, RDMS

## 2016-07-26 NOTE — Care Management Note (Signed)
Case Management Note  Patient Details  Name: Warren Odonnell MRN: [id number] Date of Birth: Dec 27, 1945  Subjective/Objective:             Patient was admitted with CVA. Lives at home with spouse. CM will follow for discharge needs pending PT/OT evals and physician orders.        Action/Plan:   Expected Discharge Date:                  Expected Discharge Plan:     In-House Referral:     Discharge planning Services     Post Acute Care Choice:    Choice offered to:     DME Arranged:    DME Agency:     HH Arranged:    HH Agency:     Status of Service:     If discussed at [employer] of Stay Meetings, dates discussed:    Additional Comments:  Rolm Baptise, RN 07/26/2016, 10:05 AM

## 2016-07-26 NOTE — Progress Notes (Addendum)
[facility] radiology called with results from 2/19 MRI cervical and thoracic.

## 2016-07-26 NOTE — Progress Notes (Signed)
I spoke with the patient's attending today and he will plan to dc the patient tomorrow. I will plan to come see the patient tomorrow at 3:30 pm to discuss options for radiotherapy, as Dr. Johny Shears schedule won't allow for an appointment this Thursday. The patient will go for PET at 12:30 on 2/22 as previously scheduled prior to this admission, and I will try to secure simulation time at 9 am on 2/23.

## 2016-07-26 NOTE — Evaluation (Signed)
Speech Language Pathology Evaluation Patient Details Name: Warren Odonnell MRN: [id number] DOB: [birth year] Today's Date: 07/26/2016 Time: 6812-7517 SLP Time Calculation (min) (ACUTE ONLY): 24 min  Problem List:  Patient Active Problem List   Diagnosis Date Noted  . Stroke ([place]) 07/25/2016  . Delirium 07/25/2016  . Lacunar stroke ([place])   . 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 Weight loss 07/24/2016  . Bone metastases ([place]) 07/23/2016  . Lung mass 07/19/2016   Past Medical History:  Past Medical History:  Diagnosis Date  . Bone metastases ([place]) 07/23/2016  . 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 Glaucoma   . Testicular swelling   . Weight loss 07/24/2016   Past Surgical History:  Past Surgical History:  Procedure Laterality Date  . HERNIA REPAIR  1984  . testicular torsion Left    HPI:  Warren Odonnell a 71 y.o.malewith a past medical history significant for newly diagnosed lung mass and widespread bone metastaseswho presents with delirium. MRI brain with and without contrast was obtained that showed a new subcentimeter R parietal white matter infarct, no new metastases. Other PMH: glaucoma   Assessment / Plan / Recommendation Clinical Impression  Pt administered the MOCA and scored a 21/30 (26 or greater is within normal limits). He exhibited greatest difficulty in the areas of delayed recall and attention. He recalled MD Leonie Man) who came earlier but stated he had a "small heart attack" and had difficulty recalling "stroke" at the end of the study. Mild word finding deficits. 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 SLP encouraged him to have wife double check his work. ST follow up is not needed at this time although recommend 24 hour supervision initially.    SLP Assessment  SLP Recommendation/Assessment: Patient does not need any further Speech Lanaguage Pathology Services    Follow Up Recommendations  None    Frequency and Duration           SLP Evaluation Cognition  Overall Cognitive Status: Impaired/Different from baseline Arousal/Alertness: Awake/alert Orientation Level: Oriented X4 Attention: Sustained Sustained Attention: Appears intact Memory: Impaired Memory Impairment: Retrieval deficit;Storage deficit Awareness: Impaired Awareness Impairment: Anticipatory impairment Problem Solving: Impaired Safety/Judgment: Appears intact       Comprehension  Auditory Comprehension Overall Auditory Comprehension: Appears within functional limits for tasks assessed Yes/No Questions: Not tested Commands: Not tested Visual Recognition/Discrimination Discrimination: Not tested Reading Comprehension Reading Status: Not tested    Expression Expression Primary Mode of Expression: Verbal Verbal Expression Overall Verbal Expression: Appears within functional limits for tasks assessed Initiation: No impairment Level of Generative/Spontaneous Verbalization: Conversation Repetition: Impaired Level of Impairment: Sentence level Naming:  (additional time to generate "camel") Pragmatics: No impairment Written Expression Dominant Hand: Right Written Expression: Not tested   Oral / Motor  Oral Motor/Sensory Function Overall Oral Motor/Sensory Function: Within functional limits Motor Speech Overall Motor Speech: Appears within functional limits for tasks assessed Intelligibility: Intelligible Motor Planning: Witnin functional limits   GO                    Houston Siren 07/26/2016, 3:26 PM  Orbie Pyo Nyara Capell M.Ed [employer] [phone]

## 2016-07-26 NOTE — Progress Notes (Signed)
Nutrition Brief Note  Patient identified on the Malnutrition Screening Tool (MST) Report  Wt Readings from Last 15 Encounters:  07/25/16 168 lb 8 oz (76.4 kg)  07/23/16 171 lb 3.2 oz (77.7 kg)  06/29/16 180 lb (81.6 kg)  06/03/16 180 lb (81.6 kg)  05/09/16 180 lb (81.6 kg)    Body mass index is 23.5 kg/m. Patient meets criteria for Normal Weight based on current BMI. Pt reports that due to vertigo and taste changes her was eating poorly for one month and lost 10 lbs during that time. He states that for the week his appetite has been good and he has been eating very well. RD discussed the importance of protein intake during and after periods of rapid weight loss. Pt states that his wife has been providing him with Ensure and Boost supplements. Pt declined nutrition-focused physical exam by RD, stating that he is doing well with nutrition and does not need anything at this time.  Current diet order is Heart Healthy, patient is consuming approximately 100% of meals at this time. Labs and medications reviewed.   No nutrition interventions warranted at this time. If nutrition issues arise, please consult RD.   Scarlette Ar RD, LDN, CSP Inpatient Clinical Dietitian Pager: [phone] After Hours Pager: [phone]

## 2016-07-26 NOTE — Progress Notes (Signed)
Physical Therapy Treatment Patient Details Name: Warren Odonnell MRN: [id number] DOB: 1946/03/25 Today's Date: 07/26/2016    History of Present Illness Admitted with AMS, recent difficulty walking; new diagnosis of lung Ca with bony mets (painful); imaging showing small R parietal CVA    PT Comments    Improvements noted in gait pattern and activity tolerance; Continue to recommend RW use with amb   Follow Up Recommendations  Home health PT;Supervision/Assistance - 24 hour     Equipment Recommendations  Rolling walker with 5" wheels;3in1 (PT)    Recommendations for Other Services       Precautions / Restrictions Precautions Precautions: Fall    Mobility  Bed Mobility                  Transfers Overall transfer level: Needs assistance Equipment used: Rolling walker (2 wheeled) Transfers: Sit to/from Stand Sit to Stand: Min guard            Ambulation/Gait Ambulation/Gait assistance: Min guard (without physical contact) Ambulation Distance (Feet): 180 Feet Assistive device: None;Rolling walker (2 wheeled) Gait Pattern/deviations: Step-through pattern Gait velocity: Improving   General Gait Details: Cues to self-monitor for activity tolerance; Noting much improved gait pattern with better step width and step length; halfway through amb, noted incr SOB, and opted to use RW on teh way back to the room with noted less work of walking; O2 sats 98% post amb on Biochemist, clinical Rankin (Stroke Patients Only) Modified Rankin (Stroke Patients Only) Pre-Morbid Rankin Score: Slight disability (This measurement is clouded by recent dx of Ca with mets) Modified Rankin: Slight disability     Balance                                    Cognition Arousal/Alertness: Awake/alert Behavior During Therapy: WFL for tasks assessed/performed Overall Cognitive Status: Within Functional Limits for tasks  assessed                      Exercises Other Exercises Other Exercises: Taught neck stretches aimed at upper trapezius    General Comments        Pertinent Vitals/Pain Pain Assessment: Faces Faces Pain Scale: Hurts a little bit Pain Location: mid back pain after up and about and neck stiffness Pain Descriptors / Indicators: Aching;Sore Pain Intervention(s): Monitored during session    Home Living                      Prior Function            PT Goals (current goals can now be found in the care plan section) Acute Rehab PT Goals Patient Stated Goal: get tests done PT Goal Formulation: With patient Time For Goal Achievement: 08/08/16 Potential to Achieve Goals: Good Progress towards PT goals: Progressing toward goals    Frequency    Min 3X/week      PT Plan Current plan remains appropriate    Co-evaluation             End of Session Equipment Utilized During Treatment: Gait belt Activity Tolerance: Patient tolerated treatment well Patient left: in chair;with call bell/phone within reach;with chair alarm set;with family/visitor present   PT Visit Diagnosis: Unsteadiness on feet (R26.81);Other abnormalities of gait and mobility (R26.89)  Time: 3435-6861 PT Time Calculation (min) (ACUTE ONLY): 24 min  Charges:  $Gait Training: 23-37 mins                    G Codes:       Colletta [place] 08/04/2016, 12:44 PM  Roney Marion, [place] Pager [phone] Office [phone]

## 2016-07-26 NOTE — Progress Notes (Signed)
Error tes

## 2016-07-26 NOTE — Progress Notes (Signed)
[place]         [phone] ________________________________  Initial outpatient Consultation  Name: Warren Odonnell MRN: [id number]  Date: 07/26/2016  DOB: 11-13-1945  REFERRING PHYSICIAN: Curt Bears, MD  DIAGNOSIS: The primary encounter diagnosis was Adenocarcinoma of right lung, stage 4 ([place]). A diagnosis of Bone metastases ([place]) was also pertinent to this visit.    ICD-9-CM ICD-10-CM   1. Adenocarcinoma of right lung, stage 4 (HCC) 162.9 C34.91   2. Bone metastases (HCC) 198.5 C79.51     HISTORY OF PRESENT ILLNESS::Warren Odonnell is a 71 y.o. male who is    Patient reports moderate to sever pain in the lower back and right shoulder. He notes visual changes including blurred vision. He reports decreased balance when up on his feet with rolling walker. A 10 pound weight loss was noted in the last few weeks. 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  PREVIOUS RADIATION THERAPY: No  Past Medical History:  Diagnosis Date  . Adenocarcinoma of right lung, stage 4 ([place]) 07/30/2016  . Bone metastases ([place]) 07/23/2016  . 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 Glaucoma   . Testicular swelling   . Weight loss 07/24/2016  :   Past Surgical History:  Procedure Laterality Date  . HERNIA REPAIR  1984  . testicular torsion Left   :   Current Outpatient Prescriptions:  .  amLODipine (NORVASC) 10 MG tablet, Take 1 tablet (10 mg total) by mouth daily., Disp: 30 tablet, Rfl: 0 .  aspirin 325 MG tablet, Take 1 tablet (325 mg total) by mouth daily., Disp: 30 tablet, Rfl: 0 .  atorvastatin (LIPITOR) 20 MG tablet, Take 1 tablet (20 mg total) by mouth daily at 6 PM., Disp: 30 tablet, Rfl: 0 .  bimatoprost (LUMIGAN) 0.01 % SOLN, Place 1 drop into both eyes at bedtime., Disp: , Rfl:  .  dexamethasone (DECADRON) 2 MG tablet, Take 1 tablet (2 mg total) by mouth every 8 (eight) hours., Disp: 60 tablet, Rfl: 0 .   dorzolamide (TRUSOPT) 2 % ophthalmic solution, Place 1 drop into both eyes 2 (two) times daily. , Disp: , Rfl:  .  esomeprazole (NEXIUM) 20 MG capsule, Take 20 mg by mouth daily. , Disp: , Rfl:  .  fluconazole (DIFLUCAN) 100 MG tablet, Take 1 tablet (100 mg total) by mouth daily., Disp: 10 tablet, Rfl: 0 .  guaiFENesin (MUCINEX) 600 MG 12 hr tablet, Take 2 tablets (1,200 mg total) by mouth 2 (two) times daily., Disp: 30 tablet, Rfl: 0 .  hydrALAZINE (APRESOLINE) 25 MG tablet, Take 1 tablet (25 mg total) by mouth every 8 (eight) hours., Disp: 90 tablet, Rfl: 0 .  HYDROcodone-acetaminophen (NORCO/VICODIN) 5-325 MG tablet, Take 1 tablet by mouth 3 (three) times daily as needed for moderate pain., Disp: , Rfl:  .  morphine (MS CONTIN) 15 MG 12 hr tablet, Take 1 tablet (15 mg total) by mouth every 12 (twelve) hours., Disp: 20 tablet, Rfl: 0 .  Multiple Vitamin (MULTIVITAMIN WITH MINERALS) TABS tablet, Take 1 tablet by mouth daily., Disp: , Rfl:  .  Multiple Vitamins-Minerals (PRESERVISION AREDS 2+MULTI VIT) CAPS, Take 1 capsule by mouth 2 (two) times daily., Disp: , Rfl:  .  ondansetron (ZOFRAN) 8 MG tablet, Take 8 mg by mouth every 8 (eight) hours as needed for nausea or vomiting., Disp: , Rfl:  .  polyethylene glycol (MIRALAX / GLYCOLAX) packet, Take 17 g  by mouth 2 (two) times daily., Disp: 14 each, Rfl: 0 .  prochlorperazine (COMPAZINE) 10 MG tablet, Take 1 tablet (10 mg total) by mouth every 6 (six) hours as needed for nausea or vomiting., Disp: 30 tablet, Rfl: 0 .  senna-docusate (SENOKOT-S) 8.6-50 MG tablet, Take 2 tablets by mouth 2 (two) times daily., Disp: 30 tablet, Rfl: 0 .  triamcinolone cream (KENALOG) 0.1 %, Apply 1 application topically 2 (two) times daily as needed (for rash). , Disp: , Rfl: :  No Known Allergies:  No family history on file.:   Social History   Social History  . Marital status: Married    Spouse name: N/A  . Number of children: N/A  . Years of education: N/A    Occupational History  . Not on file.   Social History Main Topics  . Smoking status: Never Smoker  . Smokeless tobacco: Never Used  . Alcohol use No  . Drug use: No  . Sexual activity: Not Currently   Other Topics Concern  . Not on file   Social History Narrative  . No narrative on file  :  REVIEW OF SYSTEMS:  A 15 point review of systems is documented in the electronic medical record. This was obtained by the nursing staff. However, I reviewed this with the patient to discuss relevant findings and make appropriate changes.  Pertinent items are noted in HPI.   PHYSICAL EXAM:  There were no vitals taken for this visit. Strength is 5/5 in lower extremities with no loss of light touch  KPS = 70  100 - Normal; no complaints; no evidence of disease. 90   - Able to carry on normal activity; minor signs or symptoms of disease. 80   - Normal activity with effort; some signs or symptoms of disease. 38   - Cares for self; unable to carry on normal activity or to do active work. 60   - Requires occasional assistance, but is able to care for most of his personal needs. 50   - Requires considerable assistance and frequent medical care. 64   - Disabled; requires special care and assistance. 77   - Severely disabled; hospital admission is indicated although death not imminent. 23   - Very sick; hospital admission necessary; active supportive treatment necessary. 10   - Moribund; fatal processes progressing rapidly. 0     - Dead  Karnofsky DA, Abelmann Hooker, Craver LS and Burchenal [facility] [phone]) The use of the nitrogen mustards in the palliative treatment of carcinoma: with particular reference to bronchogenic carcinoma Cancer 1 634-56  LABORATORY DATA:  Lab Results  Component Value Date   WBC 12.4 (H) 07/26/2016   HGB 11.5 (L) 07/26/2016   HCT 36.4 (L) 07/26/2016   MCV 94.8 07/26/2016   PLT 558 (H) 07/26/2016   Lab Results  Component Value Date   NA 137 07/26/2016   K 5.1 07/26/2016    CL 105 07/26/2016   CO2 26 07/26/2016   Lab Results  Component Value Date   ALT 43 07/24/2016   AST 34 07/24/2016   ALKPHOS 117 07/24/2016   BILITOT 0.6 07/24/2016     RADIOGRAPHY: Dg Chest 1 View  Result Date: 07/30/2016 CLINICAL DATA:  Status post right thoracentesis. Right pleural effusion. EXAM: CHEST 1 VIEW COMPARISON:  PET scan 07/29/2016. FINDINGS: The heart size is normal. There is a significant reduction in the right pleural effusion following thoracentesis. No significant pneumothorax is present. There is partial re-expansion of the right  lobe. A residual right lower lobe opacity persists. The left lung is clear. The visualized soft tissues and bony thorax are unremarkable. IMPRESSION: 1. Significant reduction in right pleural effusion without pneumothorax. 2. Residual right lower lobe opacity compatible with known mass. Electronically Signed   By: San Morelle M.D.   On: 07/30/2016 13:45   Ct Chest W Contrast  Result Date: 07/15/2016 CLINICAL DATA:  Low back and left hip pain. Multiple bony lesions most consistent with metastatic disease or myeloma by prior MRI. EXAM: CT CHEST, ABDOMEN, AND PELVIS WITH CONTRAST TECHNIQUE: Multidetector CT imaging of the chest, abdomen and pelvis was performed following the standard protocol during bolus administration of intravenous contrast. CONTRAST:  100 ml ISOVUE-300 IOPAMIDOL (ISOVUE-300) INJECTION 61% COMPARISON:  None. MRI lumbar spine 07/03/2016. FINDINGS: CT CHEST FINDINGS Cardiovascular: Heart size normal. No pericardial effusion. Calcific aortic atherosclerosis noted. Mediastinum/Nodes: There is mediastinal lymphadenopathy. Node just anterior to the carina eccentric to the right on image 42 measures 1.6 cm by 2.6 cm. Right hilar lymph node on image 48 measures 2.6 by 2.2 cm. Lungs/Pleura: Small right pleural effusion is identified. There is mild thickening and nodularity of the superior aspect of the pleura along right major fissure.  No discrete pleural nodule is identified. Solid mass in the right lower lobe on image 80 measures 2.3 x 3.3 cm. Mild dependent atelectasis is seen on the left. Musculoskeletal: Multiple lytic lesions are identified in the thoracic spine. Largest lesion is in T6 where there is a pathologic compression fracture deformity with some bony retropulsion. No obvious stenosis. Lytic lesions are also identified in scattered ribs, see for example the posterior arc of the right fifth rib on image 29. CT ABDOMEN PELVIS FINDINGS Hepatobiliary: No focal liver abnormality is seen. No gallstones, gallbladder wall thickening, or biliary dilatation. Pancreas: Unremarkable. No pancreatic ductal dilatation or surrounding inflammatory changes. Spleen: Normal in size without focal abnormality. Adrenals/Urinary Tract: Parapelvic renal cysts are seen and more prominent on the left. No solid renal lesion is identified. Ureters and urinary bladder appear normal. The adrenal glands are normal in appearance. Stomach/Bowel: Sigmoid diverticulosis without diverticulitis is noted. The colon is otherwise unremarkable. Stomach, small bowel and appendix appear normal. Vascular/Lymphatic: No significant vascular findings are present. No enlarged abdominal or pelvic lymph nodes. Reproductive: Prostate is unremarkable. Other: No abdominal wall hernia or abnormality. No abdominopelvic ascites. 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 Large lytic lesions are also seen in the posterior right ilium and sacrum. No fracture. 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 Pathologic T6 fracture with bony retropulsion but no obvious central canal stenosis. Evaluation for epidural tumor is limited on CT scan. If  the patient has myelopathic symptoms, MRI of the thoracic spine with and without contrast could be used for further evaluation. Calcific aortic and coronary atherosclerosis. Sigmoid diverticulosis without diverticulitis. Electronically Signed   By: Inge Rise M.D.   On: 07/15/2016 10:36   Ct Guided Needle Placement  Result Date: 07/28/2016 CLINICAL DATA:  Lung mass, multiple osseous lytic lesions. EXAM: CT GUIDED CORE BIOPSY OF  L1 SPINOUS PROCESS  MASS ANESTHESIA/SEDATION: Intravenous Fentanyl and Versed were administered as conscious sedation during continuous monitoring of the patient's level of consciousness and physiological / cardiorespiratory status by the radiology RN, with a total moderate sedation time of  10 minutes. PROCEDURE: The procedure risks, benefits, and alternatives were explained to the patient. Questions regarding the procedure were encouraged and answered. The patient understands and consents to the procedure. Patient placed prone. Select axial scans through the mid abdomen obtained. The lytic L1 spinous process lesion was localized and an appropriate skin entry site determined and marked. The operative field was prepped with chlorhexidinein a sterile fashion, and a sterile drape was applied covering the operative field. A sterile gown and sterile gloves were used for the procedure. Local anesthesia was provided with 1% Lidocaine. Under CT fluoroscopic guidance, a 17 gauge trocar needle was advanced to the margin of the lesion. Once needle tip position was confirmed, coaxial 18-gauge core biopsy samples were obtained, submitted in formalin to surgical pathology. The guide needle was removed. Postprocedure scans show no hemorrhage or other apparent complication. The patient tolerated the procedure well. COMPLICATIONS: None immediate FINDINGS: The lytic soft tissue attenuation L1 lesion was localized. 6 solid-appearing 2 cm 18 gauge core biopsy samples were obtained under CT guidance as  above. IMPRESSION: 1. Technically successful CT-guided core biopsy, left L1 spinous process mass. Electronically Signed   By: Lucrezia Europe M.D.   On: 07/28/2016 10:43   Mr Jeri Cos HR Contrast  Result Date: 07/24/2016 CLINICAL DATA:  Initial evaluation for acute altered mental status. History of lung cancer with osseous metastases. EXAM: MRI HEAD WITHOUT AND WITH CONTRAST TECHNIQUE: Multiplanar, multiecho pulse sequences of the brain and surrounding structures were obtained without and with intravenous contrast. CONTRAST:  30m MULTIHANCE GADOBENATE DIMEGLUMINE 529 MG/ML IV SOLN COMPARISON:  Prior MRI from 05/13/2016. FINDINGS: Brain: Study mildly degraded by motion artifact. Mild diffuse prominence of the CSF containing spaces is compatible with generalized age-related cerebral atrophy. Patchy and confluent T2/FLAIR hyperintensity within the periventricular and deep white matter both cerebral hemispheres, most consistent with chronic microvascular ischemic disease, mildly progressed relative to previous. There is a 9 mm focus of linear restricted diffusion within the subcortical/deep white matter of the right parietal lobe, compatible with a small acute ischemic small vessel infarct (series 4, image 33). No associated mass effect or hemorrhage. No other evidence for acute for subacute ischemia. Gray-white matter differentiation otherwise maintained. No evidence for acute or chronic intracranial hemorrhage. No other evidence for chronic infarction. No mass lesion, midline shift or mass effect. No hydrocephalus. No extra-axial fluid collection. Major dural sinuses are grossly patent. No abnormal enhancement. Pituitary gland and suprasellar region within normal limits. Vascular: Major intracranial vascular flow voids are maintained. Skull and upper cervical spine: Abnormal signal intensity within the left occipital condyle with associated enhancement, likely an osseous metastatic lesion (series 11, image 7). Possible  additional osseous metastatic lesion within the C3 vertebral body (series 13, image 14), not well evaluated on this exam. No significant extraosseous tumor. No stenosis within the upper cervical spine. No calvarial metastatic lesions identified. Sinuses/Orbits: Globes and orbital soft tissues demonstrate no acute abnormality. Axial myopia noted. Paranasal sinuses are clear. Small bilateral mastoid effusions, slightly larger on the left. Inner ear structures grossly unremarkable. IMPRESSION: 1. 9 mm acute ischemic small vessel white matter infarct within the subcortical/deep white matter of the right parietal lobe. No associated hemorrhage or mass effect. 2. No other acute intracranial process identified. 3. Abnormal signal intensity within the left occipital condyle, likely an osseous metastasis given provided history. No significant extra osseous extension of tumor. 4. Possible additional osseous metastatic lesion within the C3 vertebral body. Further evaluation with dedicated MRI of the  cervical spine suggested for complete evaluation. 5. No other MRI evidence for intracranial metastatic disease. 6. Mild to moderate chronic microvascular ischemic disease, mildly progressed relative to 2017. Electronically Signed   By: Jeannine Boga M.D.   On: 07/24/2016 23:06   Mr Lumbar Spine W/o Contrast  Result Date: 07/03/2016 CLINICAL DATA:  Low back and left hip pain for the last 6 weeks. EXAM: MRI LUMBAR SPINE WITHOUT CONTRAST TECHNIQUE: Multiplanar, multisequence MR imaging of the lumbar spine was performed. No intravenous contrast was administered. COMPARISON:  06/03/2016 radiographs FINDINGS: Segmentation: The lowest lumbar type non-rib-bearing vertebra is labeled as L5. Alignment:  No vertebral subluxation is observed. Vertebrae: Low T1 signal intensity lesions scattered in the lumbar spine suspicious for osseous metastatic disease. Among these is a 2.7 by 2.0 cm lesion along the left posterior inferior  endplate of L1 which significantly expands the vertebral body posteriorly into the left neural foramen, and may be associated with local epidural tumor. This is accompanied by a 2.6 by 1.9 cm lesion in the spinous process at this level and other metastatic lesions also in the vertebral body. The similar lesion is are present in the L2 vertebral body, and in the L3 spinous process, in the L5 vertebral body and spinous process, and along the upper S1 margin. Several other smaller scattered lesions are also present. Neoplastic lesions with low T1 and high T2 signal are present in both iliac bones. Conus medullaris: Extends to the L1 level and appears normal. Paraspinal and other soft tissues: No retroperitoneal adenopathy identified. Parapelvic renal cysts. Disc levels: T12-L1: Unremarkable. L1-2: The bony vertebral expansion/ tumor along the left posterior vertebral body is questionably extending into the epidural space and causes mild left foraminal stenosis and mild displacement of the left L2 nerves in the lateral extraforaminal space. Expansile lytic lesion of the posterior elements. L2-3: Mild right foraminal stenosis and mild displacement of the left L2 nerve in the lateral extraforaminal space due to left lateral extraforaminal disc protrusion along with facet arthropathy and disc bulge. L3-4: Mild displacement of the right L3 nerve in the lateral extraforaminal space due to right lateral extraforaminal disc protrusion. Mild disc bulge. L4-5:  No impingement.  Mild disc bulge. L5-S1:  Unremarkable. IMPRESSION: 1. There are scattered expansile likely lytic lesions in the lumbar vertebra, sacrum, and iliac bones compatible with either multiple myeloma or extensive osseous metastatic disease. An expansile bony lesion with possible epidural involvement along the left posterior L1 vertebral body extends into the neural foramen and contributes to the left foraminal stenosis at L1- 2. Further workup for malignancy is  recommended. 2. Lumbar spondylosis and degenerative disc disease also cause mild impingement at L2- 3 and L3-4. These results will be called to the ordering clinician or representative by the Radiologist Assistant, and communication documented in the PACS or zVision Dashboard. Electronically Signed   By: Van Clines M.D.   On: 07/03/2016 13:52   Mr Cervical Spine W Wo Contrast  Result Date: 07/26/2016 CLINICAL DATA:  Metastatic lung cancer. EXAM: MRI CERVICAL AND THORACIC SPINE WITHOUT AND WITH CONTRAST TECHNIQUE: Multiplanar and multiecho pulse sequences of the cervical spine, to include the craniocervical junction and cervicothoracic junction, and thoracic spine, were obtained without and with intravenous contrast. CONTRAST:  17m MULTIHANCE GADOBENATE DIMEGLUMINE 529 MG/ML IV SOLN COMPARISON:  Brain MRI 07/24/2016. Lumbar spine MRI 07/03/2016. CT chest, abdomen, and pelvis 07/25/2016. FINDINGS: MRI CERVICAL SPINE FINDINGS Alignment: Cervical spine straightening.  No listhesis. Vertebrae: Numerous T2 hyperintense enhancing lesions  involving the vertebral bodies and posterior elements of the cervical spine consistent with known osseous metastatic disease. There is involvement of the left occipital condyle as described on recent brain MRI. There is a pathologic C7 vertebral body compression fracture with severe vertebral body height loss centrally. C7 posterior vertebral body retropulsion and ventral epidural tumor result in mild spinal stenosis with slight ventral cord flattening. Expansile lesion in the posterior right C3 vertebral body with possible tiny amount of noncompressive epidural tumor. Cord: Normal signal. Posterior Fossa, vertebral arteries, paraspinal tissues: Unremarkable. Disc levels: Mild right neural foraminal stenosis at C4-5 due to expansile posterior element osseous metastasis at C5. Moderate left neural foraminal stenosis at C6-7 and mild left foraminal stenosis at C7-T1 due to  expansile C7 tumor. MRI THORACIC SPINE FINDINGS Alignment:  Normal. Vertebrae: Numerous T2 hyperintense enhancing lesions throughout the vertebral bodies and posterior elements of the thoracic spine. Partially visualized sternal and rib metastases. Small volume ventral epidural tumor predominantly on the right at T2. Pathologic T6 compression fracture with severe height loss centrally and anteriorly and small volume ventral epidural tumor on the right contributing to mild spinal stenosis without spinal cord compression. Partially visualized expansile lesion in the posterior left L1 vertebral body with epidural tumor as seen on prior lumbar spine MRI narrowing the left lateral recess and left L1-2 neural foramen. Possible tiny amount of left ventral epidural tumor at T8. Cord: Normal signal. Paraspinal and other soft tissues: Moderate right pleural effusion. Disc levels: Preserved disc space heights. No disc herniation. Spinal and neural foraminal narrowing due to osseous an epidural tumor as described above. IMPRESSION: 1. Widespread osseous metastases. 2. Pathologic vertebral body fractures at C7 and T6 with retropulsion and small volume epidural tumor resulting in mild spinal stenosis. These results will be called to the ordering clinician or representative by the Radiologist Assistant, and communication documented in the PACS or zVision Dashboard. Electronically Signed   By: Logan Bores M.D.   On: 07/26/2016 20:02   Mr Thoracic Spine W Wo Contrast  Result Date: 07/26/2016 CLINICAL DATA:  Metastatic lung cancer. EXAM: MRI CERVICAL AND THORACIC SPINE WITHOUT AND WITH CONTRAST TECHNIQUE: Multiplanar and multiecho pulse sequences of the cervical spine, to include the craniocervical junction and cervicothoracic junction, and thoracic spine, were obtained without and with intravenous contrast. CONTRAST:  27m MULTIHANCE GADOBENATE DIMEGLUMINE 529 MG/ML IV SOLN COMPARISON:  Brain MRI 07/24/2016. Lumbar spine MRI  07/03/2016. CT chest, abdomen, and pelvis 07/25/2016. FINDINGS: MRI CERVICAL SPINE FINDINGS Alignment: Cervical spine straightening.  No listhesis. Vertebrae: Numerous T2 hyperintense enhancing lesions involving the vertebral bodies and posterior elements of the cervical spine consistent with known osseous metastatic disease. There is involvement of the left occipital condyle as described on recent brain MRI. There is a pathologic C7 vertebral body compression fracture with severe vertebral body height loss centrally. C7 posterior vertebral body retropulsion and ventral epidural tumor result in mild spinal stenosis with slight ventral cord flattening. Expansile lesion in the posterior right C3 vertebral body with possible tiny amount of noncompressive epidural tumor. Cord: Normal signal. Posterior Fossa, vertebral arteries, paraspinal tissues: Unremarkable. Disc levels: Mild right neural foraminal stenosis at C4-5 due to expansile posterior element osseous metastasis at C5. Moderate left neural foraminal stenosis at C6-7 and mild left foraminal stenosis at C7-T1 due to expansile C7 tumor. MRI THORACIC SPINE FINDINGS Alignment:  Normal. Vertebrae: Numerous T2 hyperintense enhancing lesions throughout the vertebral bodies and posterior elements of the thoracic spine. Partially visualized sternal and rib  metastases. Small volume ventral epidural tumor predominantly on the right at T2. Pathologic T6 compression fracture with severe height loss centrally and anteriorly and small volume ventral epidural tumor on the right contributing to mild spinal stenosis without spinal cord compression. Partially visualized expansile lesion in the posterior left L1 vertebral body with epidural tumor as seen on prior lumbar spine MRI narrowing the left lateral recess and left L1-2 neural foramen. Possible tiny amount of left ventral epidural tumor at T8. Cord: Normal signal. Paraspinal and other soft tissues: Moderate right pleural  effusion. Disc levels: Preserved disc space heights. No disc herniation. Spinal and neural foraminal narrowing due to osseous an epidural tumor as described above. IMPRESSION: 1. Widespread osseous metastases. 2. Pathologic vertebral body fractures at C7 and T6 with retropulsion and small volume epidural tumor resulting in mild spinal stenosis. These results will be called to the ordering clinician or representative by the Radiologist Assistant, and communication documented in the PACS or zVision Dashboard. Electronically Signed   By: Logan Bores M.D.   On: 07/26/2016 20:02   Ct Abdomen Pelvis W Contrast  Result Date: 07/15/2016 CLINICAL DATA:  Low back and left hip pain. Multiple bony lesions most consistent with metastatic disease or myeloma by prior MRI. EXAM: CT CHEST, ABDOMEN, AND PELVIS WITH CONTRAST TECHNIQUE: Multidetector CT imaging of the chest, abdomen and pelvis was performed following the standard protocol during bolus administration of intravenous contrast. CONTRAST:  100 ml ISOVUE-300 IOPAMIDOL (ISOVUE-300) INJECTION 61% COMPARISON:  None. MRI lumbar spine 07/03/2016. FINDINGS: CT CHEST FINDINGS Cardiovascular: Heart size normal. No pericardial effusion. Calcific aortic atherosclerosis noted. Mediastinum/Nodes: There is mediastinal lymphadenopathy. Node just anterior to the carina eccentric to the right on image 42 measures 1.6 cm by 2.6 cm. Right hilar lymph node on image 48 measures 2.6 by 2.2 cm. Lungs/Pleura: Small right pleural effusion is identified. There is mild thickening and nodularity of the superior aspect of the pleura along right major fissure. No discrete pleural nodule is identified. Solid mass in the right lower lobe on image 80 measures 2.3 x 3.3 cm. Mild dependent atelectasis is seen on the left. Musculoskeletal: Multiple lytic lesions are identified in the thoracic spine. Largest lesion is in T6 where there is a pathologic compression fracture deformity with some bony  retropulsion. No obvious stenosis. Lytic lesions are also identified in scattered ribs, see for example the posterior arc of the right fifth rib on image 29. CT ABDOMEN PELVIS FINDINGS Hepatobiliary: No focal liver abnormality is seen. No gallstones, gallbladder wall thickening, or biliary dilatation. Pancreas: Unremarkable. No pancreatic ductal dilatation or surrounding inflammatory changes. Spleen: Normal in size without focal abnormality. Adrenals/Urinary Tract: Parapelvic renal cysts are seen and more prominent on the left. No solid renal lesion is identified. Ureters and urinary bladder appear normal. The adrenal glands are normal in appearance. Stomach/Bowel: Sigmoid diverticulosis without diverticulitis is noted. The colon is otherwise unremarkable. Stomach, small bowel and appendix appear normal. Vascular/Lymphatic: No significant vascular findings are present. No enlarged abdominal or pelvic lymph nodes. Reproductive: Prostate is unremarkable. Other: No abdominal wall hernia or abnormality. No abdominopelvic ascites. 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 Large lytic lesions are also seen in the posterior right ilium and sacrum. No fracture. 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 Pathologic T6 fracture with bony  retropulsion but no obvious central canal stenosis. Evaluation for epidural tumor is limited on CT scan. If the patient has myelopathic symptoms, MRI of the thoracic spine with and without contrast could be used for further evaluation. Calcific aortic and coronary atherosclerosis. Sigmoid diverticulosis without diverticulitis. Electronically Signed   By: Inge Rise M.D.   On: 07/15/2016 10:36   Nm Pet Image Initial (pi) Skull Base To  Thigh  Result Date: 07/29/2016 CLINICAL DATA:  Initial treatment strategy for lung carcinoma. Brain mass. Lung mass. Bone metastasis. EXAM: NUCLEAR MEDICINE PET SKULL BASE TO THIGH TECHNIQUE: A 12.5 mCi F-18 FDG was injected intravenously. Full-ring PET imaging was performed from the skull base to thigh after the radiotracer. CT data was obtained and used for attenuation correction and anatomic localization. FASTING BLOOD GLUCOSE:  Value: 109 mg/dl COMPARISON:  None CT 07/15/2016 FINDINGS: NECK No hypermetabolic lymph nodes in the neck. CHEST Hypermetabolic RIGHT lower lobe, infrahilar mass with SUV max equal 5.6. Margins of mass are difficult to define on noncontrast CT Hypermetabolic RIGHT hilar nodal masses SUV max equal 5.5. Hypermetabolic precarinal lymph node measures 19 mm short axis with SUV max equal 5.3. No hypermetabolic supraclavicular lymph nodes. Moderate RIGHT pleural effusion. ABDOMEN/PELVIS No abnormal metabolic activity the liver. Adrenal glands are normal. No hypermetabolic abdominopelvic lymphadenopathy. Prostate gland normal. SKELETON Widespread skeletal metastasis involving the axillary skeleton. These lesions are intensely hypermetabolic with associated lytic lesions on comparison CT. Lesions involve the thoracic spine, cervical spine and lumbar spine. No clear involvement central canal. Example lesion at L1 with SUV max equal 6.5. Lytic lesion in the LEFT iliac wing with SUV max equal 20.2. Lytic lesion in the medial LEFT iliac bone adjacent to SI joint with SUV max equal 5.5. Lesions in the ribs and sternum. IMPRESSION: 1. Ill-defined hypermetabolic RIGHT lower lobe mass consistent with primary bronchogenic carcinoma. 2. Hypermetabolic nodal metastasis to the RIGHT hilum and precarinal lymph node station. 3. 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 Large intensely hypermetabolic lytic lesions within the pelvis. Ribs and sternum  involved. Electronically Signed   By: Suzy Bouchard M.D.   On: 07/29/2016 17:11      IMPRESSION: 71 yo man with painful spinal metastases  PLAN:Today, I talked to the patient and family about the findings and work-up thus far.  We discussed the natural history of spine metastases and general treatment, highlighting the role of radiotherapy in the management.  We discussed the available radiation techniques, and focused on the details of logistics and delivery.  We reviewed the anticipated acute and late sequelae associated with radiation in this setting.  The patient was encouraged to ask questions that I answered to the best of my ability.  I filled out a patient counseling form during our discussion including treatment diagrams.  We retained a copy for our records.  The patient would like to proceed with radiation and will be scheduled for CT simulation.  I spent 60 minutes minutes face to face with the patient and more than 50% of that time was spent in counseling and/or coordination of care.    ------------------------------------------------   Tyler Pita, MD [place] Director and Director of Stereotactic Radiosur[facility] Direct Dial: [phone]  Fax: [phone] [email or web address]  Skype  [email or web address]  This document serves as a record of services personally performed by Tyler Pita, MD. It was created on his behalf by Bethann Humble, a trained medical scribe. The creation of this record is based  on the scribe's personal observations and the provider's statements to them. This document has been checked and approved by the attending provider.

## 2016-07-26 NOTE — Progress Notes (Signed)
PROGRESS NOTE                                                                                                                                                                                                             Patient Demographics:    Warren Odonnell, is a 71 y.o. male, DOB - [birth year], OVP:[id number]  Admit date - 07/24/2016   Admitting Physician Warren Dada, MD  Outpatient Primary MD for the patient is Warren Frees, MD  LOS - 1  Outpatient Specialists: [facility] Dr Warren Odonnell  Chief Complaint  Patient presents with  . AMS       Brief Narrative   71 y.o. male with a past medical history significant for newly diagnosed lung mass and widespread bone metastases who presents with delirium Thought to be secondary to recently started MS Contin, and steroids for pain control, as well MRI brain was significant for acute CVA.   Subjective:    Warren Odonnell today Is more coherent and appropriate, denies any complaints, appetite aspect up, reported pain is controlled . He reports constipation.    Assessment  & Plan :    Principal Problem:   Stroke [facility]) Active Problems:   Lung mass   Bone metastases (HCC)   Cancer associated pain   Delirium   Lacunar stroke ([place])   Acute Stroke:  - MRI - 9 mm acute ischemic small vessel white matter infarct within the subcortical/deep white matter of the right parietal lobe - Neurology input  with greatly appreciated, continue with aspirin 325 mg daily,  - Carotid  Dopplers Findings are consistent with a 1-39 percent stenosis involving the right internal carotid artery and the left internal carotid artery. The vertebral arteries demonstrate antegrade flow. - 2-D echo pending - LDL in 97 - hemoglobin A1c is 5.2, - neuro following  Delirium:  - This actually is the primary complaint, not clear that this is related to his infarct.   this is most likely related to his narcotic and steroids use ,  Decadron dose has been lowered, MS Contin dose has been decreased by half,  - Significant improvement of mental status, back to baseline today  Suspected Metastatic  lung cancer: - Patient has radiation therapy appointment with Dr. Tammi Odonnell on Monday. Discussed with his PA today, they will reschedule appointment for later this week. -  Has PET scan appointment at [facility] on Thursday. Schedule Dr. Earlie Odonnell, patient to keep his appointment for PET scan. - Right cervical/thoracic spine pending. - pain is currently controlled on current regimen , Continue Decadron, Morphine, Norco, Diflucan  Hyperlipidemia - LDL is 97, started on Lipitor  Hypertension - uncontrolled, will start on low-dose amlodipine  Code Status : Full  Family Communication  : Wife at bedside  Disposition Plan  : Home in am.  Consults  :  Neurology  Procedures  : None  DVT Prophylaxis  :  Lovenox -  SCDs   Lab Results  Component Value Date   PLT 558 (H) 07/26/2016    Antibiotics  :    Anti-infectives    Start     Dose/Rate Route Frequency Ordered Stop   07/25/16 1000  fluconazole (DIFLUCAN) tablet 100 mg     100 mg Oral Daily 07/25/16 0217          Objective:   Vitals:   07/25/16 2145 07/26/16 0114 07/26/16 0500 07/26/16 0900  BP: (!) 163/83 (!) 176/92 (!) 169/93 (!) 151/83  Pulse: 73 78 78 76  Resp: '18 18 18 18  ' Temp: 98.9 F (37.2 C) 98.7 F (37.1 C) 98.7 F (37.1 C)   TempSrc: Oral Oral Oral Oral  SpO2: 94% 94% 96% 97%  Weight:      Height:        Wt Readings from Last 3 Encounters:  07/25/16 76.4 kg (168 lb 8 oz)  07/23/16 77.7 kg (171 lb 3.2 oz)  06/29/16 81.6 kg (180 lb)     Intake/Output Summary (Last 24 hours) at 07/26/16 1334 Last data filed at 07/26/16 0930  Gross per 24 hour  Intake              237 ml  Output                0 ml  Net              237 ml     Physical Exam  Awake alert oriented 3, pleasant, appropriate Supple Neck,No JVD,  Symmetrical Chest wall  movement, Good air movement bilaterally, CTAB RRR,No Gallops,Rubs or new Murmurs, No Parasternal Heave +ve B.Sounds, Abd Soft, No tenderness,  No rebound - guarding or rigidity. No Cyanosis, Clubbing or edema, No new Rash or bruise      Data Review:    CBC  Recent Labs Lab 07/23/16 0854 07/24/16 1933 07/26/16 0542  WBC 11.4* 13.4* 12.4*  HGB 12.3* 11.4* 11.5*  HCT 35.8* 34.4* 36.4*  PLT 503* 542* 558*  MCV 91.6 92.0 94.8  MCH 31.5 30.5 29.9  MCHC 34.3 33.1 31.6  RDW 13.6 13.5 13.5  LYMPHSABS 1.0 0.3*  --   MONOABS 1.3* 0.9  --   EOSABS 0.1 0.0  --   BASOSABS 0.1 0.0  --     Chemistries   Recent Labs Lab 07/23/16 0854 07/24/16 1933 07/26/16 0542  NA 139 139 137  K 3.7 4.2 5.1  CL  --  106 105  CO2 '22 24 26  ' GLUCOSE 131 143* 121*  BUN 23.0 28* 21*  CREATININE 1.0 1.03 0.98  CALCIUM 10.3 9.5 9.6  AST 32 34  --   ALT 48 43  --   ALKPHOS 121 117  --   BILITOT 0.65 0.6  --    ------------------------------------------------------------------------------------------------------------------  Recent Labs  07/25/16 0457  CHOL 164  HDL 50  LDLCALC 97  TRIG 85  CHOLHDL 3.3    Lab Results  Component Value Date   HGBA1C 5.2 07/25/2016   ------------------------------------------------------------------------------------------------------------------  Recent Labs  07/25/16 0457  TSH 0.530   ------------------------------------------------------------------------------------------------------------------ No results for input(s): VITAMINB12, FOLATE, FERRITIN, TIBC, IRON, RETICCTPCT in the last 72 hours.  Coagulation profile No results for input(s): INR, PROTIME in the last 168 hours.  No results for input(s): DDIMER in the last 72 hours.  Cardiac Enzymes No results for input(s): CKMB, TROPONINI, MYOGLOBIN in the last 168 hours.  Invalid input(s):  CK ------------------------------------------------------------------------------------------------------------------ No results found for: BNP  Inpatient Medications  Scheduled Meds: . aspirin  300 mg Rectal Daily   Or  . aspirin  325 mg Oral Daily  . atorvastatin  20 mg Oral q1800  . dexamethasone  2 mg Oral Q8H  . enoxaparin (LOVENOX) injection  40 mg Subcutaneous Daily  . feeding supplement (ENSURE ENLIVE)  237 mL Oral BID BM  . fluconazole  100 mg Oral Daily  . mouth rinse  15 mL Mouth Rinse BID  . morphine  15 mg Oral Q12H  . pantoprazole  40 mg Oral Daily  . polyethylene glycol  17 g Oral BID  . senna-docusate  2 tablet Oral BID  . thiamine  100 mg Oral Daily   Continuous Infusions: PRN Meds:.acetaminophen **OR** acetaminophen (TYLENOL) oral liquid 160 mg/5 mL **OR** acetaminophen, alum & mag hydroxide-simeth, HYDROcodone-acetaminophen  Micro Results No results found for this or any previous visit (from the past 240 hour(s)).  Radiology Reports Ct Chest W Contrast  Result Date: 07/15/2016 CLINICAL DATA:  Low back and left hip pain. Multiple bony lesions most consistent with metastatic disease or myeloma by prior MRI. EXAM: CT CHEST, ABDOMEN, AND PELVIS WITH CONTRAST TECHNIQUE: Multidetector CT imaging of the chest, abdomen and pelvis was performed following the standard protocol during bolus administration of intravenous contrast. CONTRAST:  100 ml ISOVUE-300 IOPAMIDOL (ISOVUE-300) INJECTION 61% COMPARISON:  None. MRI lumbar spine 07/03/2016. FINDINGS: CT CHEST FINDINGS Cardiovascular: Heart size normal. No pericardial effusion. Calcific aortic atherosclerosis noted. Mediastinum/Nodes: There is mediastinal lymphadenopathy. Node just anterior to the carina eccentric to the right on image 42 measures 1.6 cm by 2.6 cm. Right hilar lymph node on image 48 measures 2.6 by 2.2 cm. Lungs/Pleura: Small right pleural effusion is identified. There is mild thickening and nodularity of the  superior aspect of the pleura along right major fissure. No discrete pleural nodule is identified. Solid mass in the right lower lobe on image 80 measures 2.3 x 3.3 cm. Mild dependent atelectasis is seen on the left. Musculoskeletal: Multiple lytic lesions are identified in the thoracic spine. Largest lesion is in T6 where there is a pathologic compression fracture deformity with some bony retropulsion. No obvious stenosis. Lytic lesions are also identified in scattered ribs, see for example the posterior arc of the right fifth rib on image 29. CT ABDOMEN PELVIS FINDINGS Hepatobiliary: No focal liver abnormality is seen. No gallstones, gallbladder wall thickening, or biliary dilatation. Pancreas: Unremarkable. No pancreatic ductal dilatation or surrounding inflammatory changes. Spleen: Normal in size without focal abnormality. Adrenals/Urinary Tract: Parapelvic renal cysts are seen and more prominent on the left. No solid renal lesion is identified. Ureters and urinary bladder appear normal. The adrenal glands are normal in appearance. Stomach/Bowel: Sigmoid diverticulosis without diverticulitis is noted. The colon is otherwise unremarkable. Stomach, small bowel and appendix appear normal. Vascular/Lymphatic: No significant vascular findings are present. No enlarged abdominal or pelvic lymph nodes. Reproductive: Prostate is unremarkable. Other: No abdominal  wall hernia or abnormality. No abdominopelvic ascites. 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 Large lytic lesions are also seen in the posterior right ilium and sacrum. No fracture. 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 Pathologic T6 fracture with bony retropulsion but no obvious central canal stenosis.  Evaluation for epidural tumor is limited on CT scan. If the patient has myelopathic symptoms, MRI of the thoracic spine with and without contrast could be used for further evaluation. Calcific aortic and coronary atherosclerosis. Sigmoid diverticulosis without diverticulitis. Electronically Signed   By: Inge Rise M.D.   On: 07/15/2016 10:36   Mr Jeri Cos DV Contrast  Result Date: 07/24/2016 CLINICAL DATA:  Initial evaluation for acute altered mental status. History of lung cancer with osseous metastases. EXAM: MRI HEAD WITHOUT AND WITH CONTRAST TECHNIQUE: Multiplanar, multiecho pulse sequences of the brain and surrounding structures were obtained without and with intravenous contrast. CONTRAST:  48m MULTIHANCE GADOBENATE DIMEGLUMINE 529 MG/ML IV SOLN COMPARISON:  Prior MRI from 05/13/2016. FINDINGS: Brain: Study mildly degraded by motion artifact. Mild diffuse prominence of the CSF containing spaces is compatible with generalized age-related cerebral atrophy. Patchy and confluent T2/FLAIR hyperintensity within the periventricular and deep white matter both cerebral hemispheres, most consistent with chronic microvascular ischemic disease, mildly progressed relative to previous. There is a 9 mm focus of linear restricted diffusion within the subcortical/deep white matter of the right parietal lobe, compatible with a small acute ischemic small vessel infarct (series 4, image 33). No associated mass effect or hemorrhage. No other evidence for acute for subacute ischemia. Gray-white matter differentiation otherwise maintained. No evidence for acute or chronic intracranial hemorrhage. No other evidence for chronic infarction. No mass lesion, midline shift or mass effect. No hydrocephalus. No extra-axial fluid collection. Major dural sinuses are grossly patent. No abnormal enhancement. Pituitary gland and suprasellar region within normal limits. Vascular: Major intracranial vascular flow voids are maintained.  Skull and upper cervical spine: Abnormal signal intensity within the left occipital condyle with associated enhancement, likely an osseous metastatic lesion (series 11, image 7). Possible additional osseous metastatic lesion within the C3 vertebral body (series 13, image 14), not well evaluated on this exam. No significant extraosseous tumor. No stenosis within the upper cervical spine. No calvarial metastatic lesions identified. Sinuses/Orbits: Globes and orbital soft tissues demonstrate no acute abnormality. Axial myopia noted. Paranasal sinuses are clear. Small bilateral mastoid effusions, slightly larger on the left. Inner ear structures grossly unremarkable. IMPRESSION: 1. 9 mm acute ischemic small vessel white matter infarct within the subcortical/deep white matter of the right parietal lobe. No associated hemorrhage or mass effect. 2. No other acute intracranial process identified. 3. Abnormal signal intensity within the left occipital condyle, likely an osseous metastasis given provided history. No significant extra osseous extension of tumor. 4. Possible additional osseous metastatic lesion within the C3 vertebral body. Further evaluation with dedicated MRI of the cervical spine suggested for complete evaluation. 5. No other MRI evidence for intracranial metastatic disease. 6. Mild to moderate chronic microvascular ischemic disease, mildly progressed relative to 2017. Electronically Signed   By: BJeannine BogaM.D.   On: 07/24/2016 23:06   Mr Lumbar Spine W/o Contrast  Result Date: 07/03/2016 CLINICAL DATA:  Low back and left hip pain for the last 6 weeks. EXAM: MRI LUMBAR SPINE WITHOUT CONTRAST  TECHNIQUE: Multiplanar, multisequence MR imaging of the lumbar spine was performed. No intravenous contrast was administered. COMPARISON:  06/03/2016 radiographs FINDINGS: Segmentation: The lowest lumbar type non-rib-bearing vertebra is labeled as L5. Alignment:  No vertebral subluxation is observed.  Vertebrae: Low T1 signal intensity lesions scattered in the lumbar spine suspicious for osseous metastatic disease. Among these is a 2.7 by 2.0 cm lesion along the left posterior inferior endplate of L1 which significantly expands the vertebral body posteriorly into the left neural foramen, and may be associated with local epidural tumor. This is accompanied by a 2.6 by 1.9 cm lesion in the spinous process at this level and other metastatic lesions also in the vertebral body. The similar lesion is are present in the L2 vertebral body, and in the L3 spinous process, in the L5 vertebral body and spinous process, and along the upper S1 margin. Several other smaller scattered lesions are also present. Neoplastic lesions with low T1 and high T2 signal are present in both iliac bones. Conus medullaris: Extends to the L1 level and appears normal. Paraspinal and other soft tissues: No retroperitoneal adenopathy identified. Parapelvic renal cysts. Disc levels: T12-L1: Unremarkable. L1-2: The bony vertebral expansion/ tumor along the left posterior vertebral body is questionably extending into the epidural space and causes mild left foraminal stenosis and mild displacement of the left L2 nerves in the lateral extraforaminal space. Expansile lytic lesion of the posterior elements. L2-3: Mild right foraminal stenosis and mild displacement of the left L2 nerve in the lateral extraforaminal space due to left lateral extraforaminal disc protrusion along with facet arthropathy and disc bulge. L3-4: Mild displacement of the right L3 nerve in the lateral extraforaminal space due to right lateral extraforaminal disc protrusion. Mild disc bulge. L4-5:  No impingement.  Mild disc bulge. L5-S1:  Unremarkable. IMPRESSION: 1. There are scattered expansile likely lytic lesions in the lumbar vertebra, sacrum, and iliac bones compatible with either multiple myeloma or extensive osseous metastatic disease. An expansile bony lesion with  possible epidural involvement along the left posterior L1 vertebral body extends into the neural foramen and contributes to the left foraminal stenosis at L1- 2. Further workup for malignancy is recommended. 2. Lumbar spondylosis and degenerative disc disease also cause mild impingement at L2- 3 and L3-4. These results will be called to the ordering clinician or representative by the Radiologist Assistant, and communication documented in the PACS or zVision Dashboard. Electronically Signed   By: Van Clines M.D.   On: 07/03/2016 13:52   Ct Abdomen Pelvis W Contrast  Result Date: 07/15/2016 CLINICAL DATA:  Low back and left hip pain. Multiple bony lesions most consistent with metastatic disease or myeloma by prior MRI. EXAM: CT CHEST, ABDOMEN, AND PELVIS WITH CONTRAST TECHNIQUE: Multidetector CT imaging of the chest, abdomen and pelvis was performed following the standard protocol during bolus administration of intravenous contrast. CONTRAST:  100 ml ISOVUE-300 IOPAMIDOL (ISOVUE-300) INJECTION 61% COMPARISON:  None. MRI lumbar spine 07/03/2016. FINDINGS: CT CHEST FINDINGS Cardiovascular: Heart size normal. No pericardial effusion. Calcific aortic atherosclerosis noted. Mediastinum/Nodes: There is mediastinal lymphadenopathy. Node just anterior to the carina eccentric to the right on image 42 measures 1.6 cm by 2.6 cm. Right hilar lymph node on image 48 measures 2.6 by 2.2 cm. Lungs/Pleura: Small right pleural effusion is identified. There is mild thickening and nodularity of the superior aspect of the pleura along right major fissure. No discrete pleural nodule is identified. Solid mass in the right lower lobe on image 80 measures 2.3  x 3.3 cm. Mild dependent atelectasis is seen on the left. Musculoskeletal: Multiple lytic lesions are identified in the thoracic spine. Largest lesion is in T6 where there is a pathologic compression fracture deformity with some bony retropulsion. No obvious stenosis. Lytic  lesions are also identified in scattered ribs, see for example the posterior arc of the right fifth rib on image 29. CT ABDOMEN PELVIS FINDINGS Hepatobiliary: No focal liver abnormality is seen. No gallstones, gallbladder wall thickening, or biliary dilatation. Pancreas: Unremarkable. No pancreatic ductal dilatation or surrounding inflammatory changes. Spleen: Normal in size without focal abnormality. Adrenals/Urinary Tract: Parapelvic renal cysts are seen and more prominent on the left. No solid renal lesion is identified. Ureters and urinary bladder appear normal. The adrenal glands are normal in appearance. Stomach/Bowel: Sigmoid diverticulosis without diverticulitis is noted. The colon is otherwise unremarkable. Stomach, small bowel and appendix appear normal. Vascular/Lymphatic: No significant vascular findings are present. No enlarged abdominal or pelvic lymph nodes. Reproductive: Prostate is unremarkable. Other: No abdominal wall hernia or abnormality. No abdominopelvic ascites. 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 Large lytic lesions are also seen in the posterior right ilium and sacrum. No fracture. 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 Pathologic T6 fracture with bony retropulsion but no obvious central canal stenosis. Evaluation for epidural tumor is limited on CT scan. If the patient has myelopathic symptoms, MRI of the thoracic spine with and without contrast could be used for further evaluation. Calcific aortic and coronary atherosclerosis. Sigmoid diverticulosis without diverticulitis. Electronically Signed   By: Inge Rise M.D.   On: 07/15/2016 10:36     Kelcey Wickstrom M.D on 07/26/2016 at 1:34 PM  Between 7am to 7pm - Pager  - [phone]  After 7pm go to [email or web address] - password [facility]  [facility] -  Office  [phone]

## 2016-07-26 NOTE — Telephone Encounter (Signed)
Oncology Nurse Navigator Documentation  Oncology Nurse Navigator Flowsheets 07/26/2016  Navigator Location [facility]  Navigator Encounter Type Telephone/Ms. Durall called the cancer center today.  I received this message at 4:20 pm.  I called Ms. Isakson and I listened as she explained her issues.  She would like to have PET scan in the hospital.  I updated her that due to insurance PET scans are done on the outpatient basis.  I did give her central scheduling phone number to call to change if needed.  She was also told that Dr. Tammi Klippel would possibly come by to see patient in the hospital today.  I stated I am not sure about his schedule.  I did tell her I would follow up with Dr. Johny Shears nurse.  She was thankful for the help.  I called Dr. Johny Shears nurse and left her a vm message regarding the above info and to let me know if I needed to do anything further.    Telephone Outgoing Call  Treatment Phase Abnormal Scans  Barriers/Navigation Needs Coordination of Care  Interventions Coordination of Care  Acuity Level 2  Time Spent with Patient 30

## 2016-07-26 NOTE — Progress Notes (Addendum)
STROKE TEAM PROGRESS NOTE   HISTORY OF PRESENT ILLNESS (per record) Warren Odonnell is a 71 y.o. male with diagnosis of lung cancer with bone metastasis. He has been having pain, and was taking increasing doses of hydrocodone when on Thursday he began to have hallucinations. He saw Dr. Inda Merlin on Friday where he complained of continued pain due to his bone metastasis. He was therefore started on MS Contin and Decadron following which the patient became much more confused and hallucinations increased considerably.  Due to this, he was referred to the emergency department where an MRI of the brain was performed which shows a small likely incidental ischemic infarct.  Family also notes, that he has not been walking very well and he complains of double vision for the past 4 days (though unclear if this is because he just got bifocals).  He has not been eating well at all, and when he eats, he eats only carbohydrates.  LKW: Unclear tpa given?: no, unclear time of onset   SUBJECTIVE (INTERVAL HISTORY) His wife is at bedside. 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 Wife cried, Warren Odonnell is a new diagnosis and they just saw Dr. Earlie Server last week for the first time.   OBJECTIVE Temp:  [98.4 F (36.9 C)-98.9 F (37.2 C)] 98.7 F (37.1 C) (02/19 0500) Pulse Rate:  [73-88] 77 (02/19 1348) Cardiac Rhythm: Sinus tachycardia (02/19 1138) Resp:  [17-18] 18 (02/19 0900) BP: (123-176)/(72-93) 168/86 (02/19 1348) SpO2:  [94 %-97 %] 97 % (02/19 0900)  CBC:   Recent Labs Lab 07/23/16 0854 07/24/16 1933 07/26/16 0542  WBC 11.4* 13.4* 12.4*  NEUTROABS 8.9* 12.3*  --   HGB 12.3* 11.4* 11.5*  HCT 35.8* 34.4* 36.4*  MCV 91.6 92.0 94.8  PLT 503* 542* 558*    Basic Metabolic Panel:   Recent Labs Lab 07/24/16 1933 07/26/16 0542  NA 139 137  K 4.2 5.1  CL 106 105  CO2 24 26  GLUCOSE 143* 121*  BUN 28* 21*  CREATININE 1.03 0.98  CALCIUM  9.5 9.6    Lipid Panel:     Component Value Date/Time   CHOL 164 07/25/2016 0457   TRIG 85 07/25/2016 0457   HDL 50 07/25/2016 0457   CHOLHDL 3.3 07/25/2016 0457   VLDL 17 07/25/2016 0457   LDLCALC 97 07/25/2016 0457   HgbA1c:  Lab Results  Component Value Date   HGBA1C 5.2 07/25/2016   Urine Drug Screen:     Component Value Date/Time   LABOPIA NONE DETECTED 05/09/2016 1107   COCAINSCRNUR NONE DETECTED 05/09/2016 1107   LABBENZ NONE DETECTED 05/09/2016 1107   AMPHETMU NONE DETECTED 05/09/2016 1107   THCU NONE DETECTED 05/09/2016 1107   LABBARB NONE DETECTED 05/09/2016 1107      IMAGING  Mr Brain W Wo Contrast 07/24/2016 1. 9 mm acute ischemic small vessel white matter infarct within the subcortical/deep white matter of the right parietal lobe. No associated hemorrhage or mass effect.  2. No other acute intracranial process identified.  3. Abnormal signal intensity within the left occipital condyle, likely an osseous metastasis given provided history. No significant extra osseous extension of tumor.  4. Possible additional osseous metastatic lesion within the C3 vertebral body. Further evaluation with dedicated MRI of the cervical spine suggested for complete evaluation.  5. No other MRI evidence for intracranial metastatic disease.  6. Mild to moderate chronic microvascular ischemic disease, mildly progressed relative to 2017.  Physical exam: Exam: Gen: NAD, conversant, well nourised, obese, well groomed                     CV: RRR, no MRG. No Carotid Bruits. No peripheral edema, warm, nontender Eyes: Conjunctivae clear without exudates or hemorrhage  Neuro: Detailed Neurologic Exam  Speech:    Speech is normal; fluent and spontaneous with normal comprehension.  Cognition:    The patient is oriented to person, place, and time;  Cranial Nerves:    The pupils are equal, round, and reactive to light. Visual fields are full to finger confrontation. Extraocular  movements are intact. Trigeminal sensation is intact and the muscles of mastication are normal. The face is symmetric. The palate elevates in the midline. Hearing intact. Voice is normal. Shoulder shrug is normal. The tongue has normal motion without fasciculations.   Coordination:    Normal finger to nose.  Motor Observation:    No asymmetry, no atrophy, and no involuntary movements noted. Tone:    Normal muscle tone.     Strength:    Strength is V/V in the upper and lower limbs.      Sensation: intact to LT      ASSESSMENT/PLAN Mr. Warren Odonnell is a 71 y.o. male with history of lung cancer with bone metastasis presenting with hallucinations and confusion after starting opiates and decadron for bone mets.  He did not receive IV t-PA due to unclear time of onset.  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 The vertebral arteries demonstrate antegrade flow.2D Echo - pending  Ammonia level - pending  LDL - 97  HgbA1c - 5.2  VTE prophylaxis - Lovenox Diet Heart Room service appropriate? Yes; Fluid consistency: Thin  aspirin 81 mg daily prior to admission, now on aspirin 325 mg daily  Patient counseled to be compliant with his antithrombotic medications  Ongoing aggressive stroke risk factor management  Therapy recommendations: Home health PT;Supervision/Assistance - 24 hour  Disposition: Home  Hypertension  Stable  Permissive hypertension (OK if < 220/120) but gradually normalize in 5-7 days  Long-term BP goal normotensive  Hyperlipidemia  Home meds:  No lipid lowering medications prior to admission  LDL 97, goal < 70  Add lipitor 20  mg daily  Continue statin at discharge    Other Stroke Risk Factors  Advanced age  Family hx stroke (sister)   Other Active Problems  Leukocytosis - on Decadron  Elevated platelets - 542  Bony metastasis   Per radiology - Further evaluation with dedicated MRI of the cervical spine suggested for complete  evaluation. Ordered.   Pain medications - probably contributed to hallucinations and confusion  Recommend consulting oncology inpatient to talk to patient and wife, this is a new diagnosis (just met with Dr. Earlie Server last week) and there appears to be more bone mets than they knew about.  Hospital day # 1  Personally examined patient and images, and have participated in and made any corrections needed to history, physical, neuro exam,assessment and plan as stated above.  I have personally obtained the history, evaluated lab date, reviewed imaging studies and agree with radiology interpretations. I think patient's clinical  presentation with delusions and hallucinations was likely medication effect of opiates and Decadron. MRI scan showing tiny lacunar infarct is likely incidental and asymptomatic finding. Continue aspirin for stroke prevention and aggressive risk factor modification. Check echocardiogram and Transcranial doppler results and discharged home with home therapy. Stroke team will sign off. Kindly call for questions.   Antony Contras, MD Stroke Neurology [facility]

## 2016-07-27 ENCOUNTER — Ambulatory Visit
Admit: 2016-07-27 | Discharge: 2016-07-27 | Disposition: A | Discharge status: Home / Self Care | Payer: Medicare Other | Attending: Radiation Oncology | Admitting: Radiation Oncology

## 2016-07-27 ENCOUNTER — Inpatient Hospital Stay (HOSPITAL_COMMUNITY): Payer: Medicare Other

## 2016-07-27 ENCOUNTER — Ambulatory Visit (HOSPITAL_COMMUNITY): Admission: RE | Admit: 2016-07-27 | Payer: Medicare Other | Source: Ambulatory Visit

## 2016-07-27 DIAGNOSIS — G459 Transient cerebral ischemic attack, unspecified: Secondary | ICD-10-CM

## 2016-07-27 DIAGNOSIS — R918 Other nonspecific abnormal finding of lung field: Secondary | ICD-10-CM

## 2016-07-27 DIAGNOSIS — C7951 Secondary malignant neoplasm of bone: Secondary | ICD-10-CM

## 2016-07-27 LAB — ECHOCARDIOGRAM COMPLETE
HEIGHTINCHES: 71 in
WEIGHTICAEL: 2696 [oz_av]

## 2016-07-27 MED ORDER — GUAIFENESIN ER 600 MG PO TB12
1200.0000 mg | ORAL_TABLET | Freq: Two times a day (BID) | ORAL | Status: DC
  Administered 2016-07-27 – 2016-07-28 (×3): 1200 mg via ORAL
  Filled 2016-07-27 (×3): qty 2

## 2016-07-27 MED ORDER — ENOXAPARIN SODIUM 40 MG/0.4ML ~~LOC~~ SOLN: 40.0000 mg | Freq: Every day | SUBCUTANEOUS | Status: DC

## 2016-07-27 MED ORDER — AMLODIPINE BESYLATE 10 MG PO TABS
10.0000 mg | ORAL_TABLET | Freq: Every day | ORAL | Status: DC
  Administered 2016-07-27 – 2016-07-28 (×2): 10 mg via ORAL
  Filled 2016-07-27 (×2): qty 1

## 2016-07-27 MED ORDER — PERFLUTREN LIPID MICROSPHERE
1.0000 mL | INTRAVENOUS | Status: AC | PRN
  Administered 2016-07-27: 2 mL via INTRAVENOUS
  Filled 2016-07-27: qty 10

## 2016-07-27 MED ORDER — HYDRALAZINE HCL 25 MG PO TABS
25.0000 mg | ORAL_TABLET | Freq: Three times a day (TID) | ORAL | Status: DC
  Administered 2016-07-27 – 2016-07-28 (×3): 25 mg via ORAL
  Filled 2016-07-27 (×4): qty 1

## 2016-07-27 NOTE — Progress Notes (Addendum)
PROGRESS NOTE                                                                                                                                                                                                             Patient Demographics:    Warren Odonnell, is a 71 y.o. male, DOB - [birth year], OZY:[id number]  Admit date - 07/24/2016   Admitting Physician Edwin Dada, MD  Outpatient Primary MD for the patient is Shirline Frees, MD  LOS - 2  Outpatient Specialists: [facility] Dr Earlie Server  Chief Complaint  Patient presents with  . AMS       Brief Narrative   71 y.o. male with a past medical history significant for newly diagnosed lung mass and widespread bone metastases who presents with delirium Thought to be secondary to recently started MS Contin, and steroids for pain control, as well MRI brain was significant for acute CVA,Seen by nephrology, this was thought to be incidental findings   Subjective:    Warren Odonnell today Is coherent and appropriate, denies any complaints, appetite aspect up, reported pain is controlled . Had a good BM yesterday.    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 The vertebral arteries demonstrate antegrade flow. - 2-D echo pending - Transcranial Doppler pending - LDL in 97 - hemoglobin A1c is 5.2, - neuro following  Delirium:  - This  actually is the primary complaint, not clear that this is related to his infarct.   this is most likely related to his narcotic and steroids use , Decadron dose has been lowered, MS Contin dose has been decreased by half,  - Significant improvement of mental status, back to baseline today  Suspected Metastatic  lung cancer: - Patient has  radiation therapy appointment with Dr. Tammi Klippel on Monday. Discussed with his PA today, they will reschedule appointment for later this week. -  Has PET scan appointment at [facility] on Thursday, MRI cervical/thoracic spine significant for Pathologic vertebral body fractures at C7 and T6 with retropulsion and small volume epidural tumor resulting in mild spinal stenosis, discussed with radiation oncology, request biopsy, IR consulted regarding biopsy, the plan for L1 biopsy tomorrow, then hopefully can discharge after procedure so he can catch up his PET scan on Thursday. - pain is currently controlled on current regimen , Continue Decadron, Morphine, Norco, Diflucan  Hyperlipidemia - LDL is 97, started on Lipitor  Hypertension - uncontrolled, started on amlodipine 10 mg oral daily, denies uncontrolled, started as well on low-dose hydralazine  Code Status : Full  Family Communication  : Wife at bedside  Disposition Plan  : Home with home PT tomorrow after biopsy, so he can have his PET scan on Thursday  Consults  :  Neurology, IR, discussed with oncology Dr. Earlie Server via phone  Procedures  : None  DVT Prophylaxis  :  Lovenox -  SCDs   Lab Results  Component Value Date   PLT 558 (H) 07/26/2016    Antibiotics  :    Anti-infectives    Start     Dose/Rate Route Frequency Ordered Stop   07/25/16 1000  fluconazole (DIFLUCAN) tablet 100 mg     100 mg Oral Daily 07/25/16 0217          Objective:   Vitals:   07/27/16 0100 07/27/16 0615 07/27/16 0951 07/27/16 1306  BP: (!) 183/92 (!) 179/90 (!) 169/89 (!) 156/80  Pulse: 78 77 86 77  Resp:  _0 Temp:  97.3 F (36.3 C) 97.8 F (36.6 C) 97.7 F (36.5 C)  TempSrc:  Oral Oral Oral  SpO2:  97% 95% 92%  Weight:      Height:        Wt Readings from Last 3 Encounters:  07/25/16 76.4 kg (168 lb 8 oz)  07/23/16 77.7 kg (171 lb 3.2 oz)  06/29/16 81.6 kg (180 lb)     Intake/Output Summary (Last 24 hours) at 07/27/16 1418 Last data filed at 07/27/16 1256  Gross per 24 hour  Intake              481 ml  Output                0 ml  Net              481 ml     Physical Exam  Awake alert oriented 3, pleasant, appropriate Supple Neck,No JVD,  Symmetrical Chest wall movement, Good air movement bilaterally, CTAB RRR,No Gallops,Rubs or new Murmurs, No Parasternal Heave +ve B.Sounds, Abd Soft, No tenderness,  No rebound - guarding or rigidity. No Cyanosis, Clubbing or edema, No new Rash or bruise      Data Review:    CBC  Recent Labs Lab 07/23/16 0854 07/24/16 1933 07/26/16 0542  WBC 11.4* 13.4* 12.4*  HGB 12.3* 11.4* 11.5*  HCT 35.8* 34.4* 36.4*  PLT 503* 542* 558*  MCV 91.6 92.0 94.8  MCH 31.5 30.5 29.9  MCHC 34.3 33.1 31.6  RDW 13.6 13.5 13.5  LYMPHSABS 1.0 0.3*  --   MONOABS 1.3* 0.9  --   EOSABS 0.1 0.0  --   BASOSABS 0.1 0.0  --     Chemistries   Recent Labs Lab 07/23/16 [phone]  07/24/16 1933 07/26/16 0542  NA 139 139 137  K 3.7 4.2 5.1  CL  --  106 105  CO2 _0 GLUCOSE 131 143* 121*  BUN 23.0 28* 21*  CREATININE 1.0 1.03 0.98  CALCIUM 10.3 9.5 9.6  AST 32 34  --   ALT 48 43  --   ALKPHOS 121 117  --   BILITOT 0.65 0.6  --    ------------------------------------------------------------------------------------------------------------------  Recent Labs  07/25/16 0457  CHOL 164  HDL 50  LDLCALC 97  TRIG 85  CHOLHDL 3.3    Lab Results  Component Value Date   HGBA1C 5.2 07/25/2016   ------------------------------------------------------------------------------------------------------------------  Recent Labs  07/25/16 0457   TSH 0.530   ------------------------------------------------------------------------------------------------------------------ No results for input(s): VITAMINB12, FOLATE, FERRITIN, TIBC, IRON, RETICCTPCT in the last 72 hours.  Coagulation profile No results for input(s): INR, PROTIME in the last 168 hours.  No results for input(s): DDIMER in the last 72 hours.  Cardiac Enzymes No results for input(s): CKMB, TROPONINI, MYOGLOBIN in the last 168 hours.  Invalid input(s): CK ------------------------------------------------------------------------------------------------------------------ No results found for: BNP  Inpatient Medications  Scheduled Meds: . amLODipine  10 mg Oral Daily  . aspirin  300 mg Rectal Daily   Or  . aspirin  325 mg Oral Daily  . atorvastatin  20 mg Oral q1800  . dexamethasone  2 mg Oral Q8H  . enoxaparin (LOVENOX) injection  40 mg Subcutaneous Daily  . fluconazole  100 mg Oral Daily  . guaiFENesin  1,200 mg Oral BID  . mouth rinse  15 mL Mouth Rinse BID  . morphine  15 mg Oral Q12H  . pantoprazole  40 mg Oral Daily  . polyethylene glycol  17 g Oral BID  . senna-docusate  2 tablet Oral BID  . thiamine  100 mg Oral Daily   Continuous Infusions: PRN Meds:.acetaminophen **OR** acetaminophen (TYLENOL) oral liquid 160 mg/5 mL **OR** acetaminophen, alum & mag hydroxide-simeth, HYDROcodone-acetaminophen  Micro Results No results found for this or any previous visit (from the past 240 hour(s)).  Radiology Reports Ct Chest W Contrast  Result Date: 07/15/2016 CLINICAL DATA:  Low back and left hip pain. Multiple bony lesions most consistent with metastatic disease or myeloma by prior MRI. EXAM: CT CHEST, ABDOMEN, AND PELVIS WITH CONTRAST TECHNIQUE: Multidetector CT imaging of the chest, abdomen and pelvis was performed following the standard protocol during bolus administration of intravenous contrast. CONTRAST:  100 ml ISOVUE-300 IOPAMIDOL (ISOVUE-300)  INJECTION 61% COMPARISON:  None. MRI lumbar spine 07/03/2016. FINDINGS: CT CHEST FINDINGS Cardiovascular: Heart size normal. No pericardial effusion. Calcific aortic atherosclerosis noted. Mediastinum/Nodes: There is mediastinal lymphadenopathy. Node just anterior to the carina eccentric to the right on image 42 measures 1.6 cm by 2.6 cm. Right hilar lymph node on image 48 measures 2.6 by 2.2 cm. Lungs/Pleura: Small right pleural effusion is identified. There is mild thickening and nodularity of the superior aspect of the pleura along right major fissure. No discrete pleural nodule is identified. Solid mass in the right lower lobe on image 80 measures 2.3 x 3.3 cm. Mild dependent atelectasis is seen on the left. Musculoskeletal: Multiple lytic lesions are identified in the thoracic spine. Largest lesion is in T6 where there is a pathologic compression fracture deformity with some bony retropulsion. No obvious stenosis. Lytic lesions are also identified in scattered ribs, see for example the posterior arc of the right fifth rib on image 29. CT ABDOMEN PELVIS FINDINGS Hepatobiliary: No focal liver  abnormality is seen. No gallstones, gallbladder wall thickening, or biliary dilatation. Pancreas: Unremarkable. No pancreatic ductal dilatation or surrounding inflammatory changes. Spleen: Normal in size without focal abnormality. Adrenals/Urinary Tract: Parapelvic renal cysts are seen and more prominent on the left. No solid renal lesion is identified. Ureters and urinary bladder appear normal. The adrenal glands are normal in appearance. Stomach/Bowel: Sigmoid diverticulosis without diverticulitis is noted. The colon is otherwise unremarkable. Stomach, small bowel and appendix appear normal. Vascular/Lymphatic: No significant vascular findings are present. No enlarged abdominal or pelvic lymph nodes. Reproductive: Prostate is unremarkable. Other: No abdominal wall hernia or abnormality. No abdominopelvic ascites.  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 Large lytic lesions are also seen in the posterior right ilium and sacrum. No fracture. 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 Pathologic T6 fracture with bony retropulsion but no obvious central canal stenosis. Evaluation for epidural tumor is limited on CT scan. If the patient has myelopathic symptoms, MRI of the thoracic spine with and without contrast could be used for further evaluation. Calcific aortic and coronary atherosclerosis. Sigmoid diverticulosis without diverticulitis. Electronically Signed   By: Inge Rise M.D.   On: 07/15/2016 10:36   Mr Jeri Cos OA Contrast  Result Date: 07/24/2016 CLINICAL DATA:  Initial evaluation for acute altered mental status. History of lung cancer with osseous metastases. EXAM: MRI HEAD WITHOUT AND WITH CONTRAST TECHNIQUE: Multiplanar, multiecho pulse sequences of the brain and surrounding structures were obtained without and with intravenous contrast. CONTRAST:  62m MULTIHANCE GADOBENATE DIMEGLUMINE 529 MG/ML IV SOLN COMPARISON:  Prior MRI from 05/13/2016. FINDINGS: Brain: Study mildly degraded by motion artifact. Mild diffuse prominence of the CSF containing spaces is compatible with generalized age-related cerebral atrophy. Patchy and confluent T2/FLAIR hyperintensity within the periventricular and deep white matter both cerebral hemispheres, most consistent with chronic microvascular ischemic disease, mildly progressed relative to previous. There is a 9 mm focus of linear restricted diffusion within the subcortical/deep white matter of the right parietal lobe, compatible with a small acute ischemic small vessel infarct (series 4, image 33). No associated mass  effect or hemorrhage. No other evidence for acute for subacute ischemia. Gray-white matter differentiation otherwise maintained. No evidence for acute or chronic intracranial hemorrhage. No other evidence for chronic infarction. No mass lesion, midline shift or mass effect. No hydrocephalus. No extra-axial fluid collection. Major dural sinuses are grossly patent. No abnormal enhancement. Pituitary gland and suprasellar region within normal limits. Vascular: Major intracranial vascular flow voids are maintained. Skull and upper cervical spine: Abnormal signal intensity within the left occipital condyle with associated enhancement, likely an osseous metastatic lesion (series 11, image 7). Possible additional osseous metastatic lesion within the C3 vertebral body (series 13, image 14), not well evaluated on this exam. No significant extraosseous tumor. No stenosis within the upper cervical spine. No calvarial metastatic lesions identified. Sinuses/Orbits: Globes and orbital soft tissues demonstrate no acute abnormality. Axial myopia noted. Paranasal sinuses are clear. Small bilateral mastoid effusions, slightly larger on the left. Inner ear structures grossly unremarkable. IMPRESSION: 1. 9 mm acute ischemic small vessel white matter infarct within the subcortical/deep white matter of the right parietal lobe. No associated hemorrhage or mass effect. 2. No other acute intracranial process identified. 3. Abnormal signal intensity within the left occipital condyle, likely an osseous metastasis given  provided history. No significant extra osseous extension of tumor. 4. Possible additional osseous metastatic lesion within the C3 vertebral body. Further evaluation with dedicated MRI of the cervical spine suggested for complete evaluation. 5. No other MRI evidence for intracranial metastatic disease. 6. Mild to moderate chronic microvascular ischemic disease, mildly progressed relative to 2017. Electronically Signed   By:  Jeannine Boga M.D.   On: 07/24/2016 23:06   Mr Lumbar Spine W/o Contrast  Result Date: 07/03/2016 CLINICAL DATA:  Low back and left hip pain for the last 6 weeks. EXAM: MRI LUMBAR SPINE WITHOUT CONTRAST TECHNIQUE: Multiplanar, multisequence MR imaging of the lumbar spine was performed. No intravenous contrast was administered. COMPARISON:  06/03/2016 radiographs FINDINGS: Segmentation: The lowest lumbar type non-rib-bearing vertebra is labeled as L5. Alignment:  No vertebral subluxation is observed. Vertebrae: Low T1 signal intensity lesions scattered in the lumbar spine suspicious for osseous metastatic disease. Among these is a 2.7 by 2.0 cm lesion along the left posterior inferior endplate of L1 which significantly expands the vertebral body posteriorly into the left neural foramen, and may be associated with local epidural tumor. This is accompanied by a 2.6 by 1.9 cm lesion in the spinous process at this level and other metastatic lesions also in the vertebral body. The similar lesion is are present in the L2 vertebral body, and in the L3 spinous process, in the L5 vertebral body and spinous process, and along the upper S1 margin. Several other smaller scattered lesions are also present. Neoplastic lesions with low T1 and high T2 signal are present in both iliac bones. Conus medullaris: Extends to the L1 level and appears normal. Paraspinal and other soft tissues: No retroperitoneal adenopathy identified. Parapelvic renal cysts. Disc levels: T12-L1: Unremarkable. L1-2: The bony vertebral expansion/ tumor along the left posterior vertebral body is questionably extending into the epidural space and causes mild left foraminal stenosis and mild displacement of the left L2 nerves in the lateral extraforaminal space. Expansile lytic lesion of the posterior elements. L2-3: Mild right foraminal stenosis and mild displacement of the left L2 nerve in the lateral extraforaminal space due to left lateral  extraforaminal disc protrusion along with facet arthropathy and disc bulge. L3-4: Mild displacement of the right L3 nerve in the lateral extraforaminal space due to right lateral extraforaminal disc protrusion. Mild disc bulge. L4-5:  No impingement.  Mild disc bulge. L5-S1:  Unremarkable. IMPRESSION: 1. There are scattered expansile likely lytic lesions in the lumbar vertebra, sacrum, and iliac bones compatible with either multiple myeloma or extensive osseous metastatic disease. An expansile bony lesion with possible epidural involvement along the left posterior L1 vertebral body extends into the neural foramen and contributes to the left foraminal stenosis at L1- 2. Further workup for malignancy is recommended. 2. Lumbar spondylosis and degenerative disc disease also cause mild impingement at L2- 3 and L3-4. These results will be called to the ordering clinician or representative by the Radiologist Assistant, and communication documented in the PACS or zVision Dashboard. Electronically Signed   By: Van Clines M.D.   On: 07/03/2016 13:52   Mr Cervical Spine W Wo Contrast  Result Date: 07/26/2016 CLINICAL DATA:  Metastatic lung cancer. EXAM: MRI CERVICAL AND THORACIC SPINE WITHOUT AND WITH CONTRAST TECHNIQUE: Multiplanar and multiecho pulse sequences of the cervical spine, to include the craniocervical junction and cervicothoracic junction, and thoracic spine, were obtained without and with intravenous contrast. CONTRAST:  97m MULTIHANCE GADOBENATE DIMEGLUMINE 529 MG/ML IV SOLN COMPARISON:  Brain MRI 07/24/2016. Lumbar  spine MRI 07/03/2016. CT chest, abdomen, and pelvis 07/25/2016. FINDINGS: MRI CERVICAL SPINE FINDINGS Alignment: Cervical spine straightening.  No listhesis. Vertebrae: Numerous T2 hyperintense enhancing lesions involving the vertebral bodies and posterior elements of the cervical spine consistent with known osseous metastatic disease. There is involvement of the left occipital condyle  as described on recent brain MRI. There is a pathologic C7 vertebral body compression fracture with severe vertebral body height loss centrally. C7 posterior vertebral body retropulsion and ventral epidural tumor result in mild spinal stenosis with slight ventral cord flattening. Expansile lesion in the posterior right C3 vertebral body with possible tiny amount of noncompressive epidural tumor. Cord: Normal signal. Posterior Fossa, vertebral arteries, paraspinal tissues: Unremarkable. Disc levels: Mild right neural foraminal stenosis at C4-5 due to expansile posterior element osseous metastasis at C5. Moderate left neural foraminal stenosis at C6-7 and mild left foraminal stenosis at C7-T1 due to expansile C7 tumor. MRI THORACIC SPINE FINDINGS Alignment:  Normal. Vertebrae: Numerous T2 hyperintense enhancing lesions throughout the vertebral bodies and posterior elements of the thoracic spine. Partially visualized sternal and rib metastases. Small volume ventral epidural tumor predominantly on the right at T2. Pathologic T6 compression fracture with severe height loss centrally and anteriorly and small volume ventral epidural tumor on the right contributing to mild spinal stenosis without spinal cord compression. Partially visualized expansile lesion in the posterior left L1 vertebral body with epidural tumor as seen on prior lumbar spine MRI narrowing the left lateral recess and left L1-2 neural foramen. Possible tiny amount of left ventral epidural tumor at T8. Cord: Normal signal. Paraspinal and other soft tissues: Moderate right pleural effusion. Disc levels: Preserved disc space heights. No disc herniation. Spinal and neural foraminal narrowing due to osseous an epidural tumor as described above. IMPRESSION: 1. Widespread osseous metastases. 2. Pathologic vertebral body fractures at C7 and T6 with retropulsion and small volume epidural tumor resulting in mild spinal stenosis. These results will be called to the  ordering clinician or representative by the Radiologist Assistant, and communication documented in the PACS or zVision Dashboard. Electronically Signed   By: Logan Bores M.D.   On: 07/26/2016 20:02   Mr Thoracic Spine W Wo Contrast  Result Date: 07/26/2016 CLINICAL DATA:  Metastatic lung cancer. EXAM: MRI CERVICAL AND THORACIC SPINE WITHOUT AND WITH CONTRAST TECHNIQUE: Multiplanar and multiecho pulse sequences of the cervical spine, to include the craniocervical junction and cervicothoracic junction, and thoracic spine, were obtained without and with intravenous contrast. CONTRAST:  89m MULTIHANCE GADOBENATE DIMEGLUMINE 529 MG/ML IV SOLN COMPARISON:  Brain MRI 07/24/2016. Lumbar spine MRI 07/03/2016. CT chest, abdomen, and pelvis 07/25/2016. FINDINGS: MRI CERVICAL SPINE FINDINGS Alignment: Cervical spine straightening.  No listhesis. Vertebrae: Numerous T2 hyperintense enhancing lesions involving the vertebral bodies and posterior elements of the cervical spine consistent with known osseous metastatic disease. There is involvement of the left occipital condyle as described on recent brain MRI. There is a pathologic C7 vertebral body compression fracture with severe vertebral body height loss centrally. C7 posterior vertebral body retropulsion and ventral epidural tumor result in mild spinal stenosis with slight ventral cord flattening. Expansile lesion in the posterior right C3 vertebral body with possible tiny amount of noncompressive epidural tumor. Cord: Normal signal. Posterior Fossa, vertebral arteries, paraspinal tissues: Unremarkable. Disc levels: Mild right neural foraminal stenosis at C4-5 due to expansile posterior element osseous metastasis at C5. Moderate left neural foraminal stenosis at C6-7 and mild left foraminal stenosis at C7-T1 due to expansile C7 tumor. MRI THORACIC  SPINE FINDINGS Alignment:  Normal. Vertebrae: Numerous T2 hyperintense enhancing lesions throughout the vertebral bodies and  posterior elements of the thoracic spine. Partially visualized sternal and rib metastases. Small volume ventral epidural tumor predominantly on the right at T2. Pathologic T6 compression fracture with severe height loss centrally and anteriorly and small volume ventral epidural tumor on the right contributing to mild spinal stenosis without spinal cord compression. Partially visualized expansile lesion in the posterior left L1 vertebral body with epidural tumor as seen on prior lumbar spine MRI narrowing the left lateral recess and left L1-2 neural foramen. Possible tiny amount of left ventral epidural tumor at T8. Cord: Normal signal. Paraspinal and other soft tissues: Moderate right pleural effusion. Disc levels: Preserved disc space heights. No disc herniation. Spinal and neural foraminal narrowing due to osseous an epidural tumor as described above. IMPRESSION: 1. Widespread osseous metastases. 2. Pathologic vertebral body fractures at C7 and T6 with retropulsion and small volume epidural tumor resulting in mild spinal stenosis. These results will be called to the ordering clinician or representative by the Radiologist Assistant, and communication documented in the PACS or zVision Dashboard. Electronically Signed   By: Logan Bores M.D.   On: 07/26/2016 20:02   Ct Abdomen Pelvis W Contrast  Result Date: 07/15/2016 CLINICAL DATA:  Low back and left hip pain. Multiple bony lesions most consistent with metastatic disease or myeloma by prior MRI. EXAM: CT CHEST, ABDOMEN, AND PELVIS WITH CONTRAST TECHNIQUE: Multidetector CT imaging of the chest, abdomen and pelvis was performed following the standard protocol during bolus administration of intravenous contrast. CONTRAST:  100 ml ISOVUE-300 IOPAMIDOL (ISOVUE-300) INJECTION 61% COMPARISON:  None. MRI lumbar spine 07/03/2016. FINDINGS: CT CHEST FINDINGS Cardiovascular: Heart size normal. No pericardial effusion. Calcific aortic atherosclerosis noted.  Mediastinum/Nodes: There is mediastinal lymphadenopathy. Node just anterior to the carina eccentric to the right on image 42 measures 1.6 cm by 2.6 cm. Right hilar lymph node on image 48 measures 2.6 by 2.2 cm. Lungs/Pleura: Small right pleural effusion is identified. There is mild thickening and nodularity of the superior aspect of the pleura along right major fissure. No discrete pleural nodule is identified. Solid mass in the right lower lobe on image 80 measures 2.3 x 3.3 cm. Mild dependent atelectasis is seen on the left. Musculoskeletal: Multiple lytic lesions are identified in the thoracic spine. Largest lesion is in T6 where there is a pathologic compression fracture deformity with some bony retropulsion. No obvious stenosis. Lytic lesions are also identified in scattered ribs, see for example the posterior arc of the right fifth rib on image 29. CT ABDOMEN PELVIS FINDINGS Hepatobiliary: No focal liver abnormality is seen. No gallstones, gallbladder wall thickening, or biliary dilatation. Pancreas: Unremarkable. No pancreatic ductal dilatation or surrounding inflammatory changes. Spleen: Normal in size without focal abnormality. Adrenals/Urinary Tract: Parapelvic renal cysts are seen and more prominent on the left. No solid renal lesion is identified. Ureters and urinary bladder appear normal. The adrenal glands are normal in appearance. Stomach/Bowel: Sigmoid diverticulosis without diverticulitis is noted. The colon is otherwise unremarkable. Stomach, small bowel and appendix appear normal. Vascular/Lymphatic: No significant vascular findings are present. No enlarged abdominal or pelvic lymph nodes. Reproductive: Prostate is unremarkable. Other: No abdominal wall hernia or abnormality. No abdominopelvic ascites. 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 Large lytic lesions are also seen in the posterior  right ilium and sacrum. No fracture. 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 Pathologic T6 fracture with bony retropulsion but no obvious central canal stenosis. Evaluation for epidural tumor is limited on CT scan. If the patient has myelopathic symptoms, MRI of the thoracic spine with and without contrast could be used for further evaluation. Calcific aortic and coronary atherosclerosis. Sigmoid diverticulosis without diverticulitis. Electronically Signed   By: Inge Rise M.D.   On: 07/15/2016 10:36     Renley Gutman M.D on 07/27/2016 at 2:18 PM  Between 7am to 7pm - Pager - [phone]  After 7pm go to [email or web address] - password [facility]  [facility] -  Office  [phone]

## 2016-07-27 NOTE — Care Management Note (Signed)
Case Management Note  Patient Details  Name: Warren Odonnell MRN: [id number] Date of Birth: 04/18/1946  Subjective/Objective:                    Action/Plan: Plan is for patient to d/c home with medically ready. Pt with orders for HH services. CM met with the patient and his wife and provided a list of HH agencies. They selected [facility]. Mary with [facility] notified and accepted the referral.  Pt with orders for rolling walker and 3 in 1. Jermaine with [facility] DME notified and will deliver the equipment to the room.   Expected Discharge Date:                  Expected Discharge Plan:  Home w Home Health Services  In-House Referral:     Discharge planning Services  CM Consult  Post Acute Care Choice:  Durable Medical Equipment, Home Health Choice offered to:  Patient, Spouse  DME Arranged:  3-N-1, Walker rolling DME Agency:  [facility]  HH Arranged:  PT HH Agency:  [facility] (now [facility])  Status of Service:  Completed, signed off  If discussed at Long Length of Stay Meetings, dates discussed:    Additional Comments:   F , RN 07/27/2016, 2:49 PM  

## 2016-07-27 NOTE — Care Management Important Message (Signed)
Important Message  Patient Details  Name: Warren Odonnell MRN: [id number] Date of Birth: 09-27-1945   Medicare Important Message Given:  Yes    Orbie Pyo 07/27/2016, 11:35 AM

## 2016-07-27 NOTE — Progress Notes (Signed)
Physical Therapy Treatment Patient Details Name: Warren Odonnell MRN: [id number] DOB: [birth year] Today's Date: 07/27/2016    History of Present Illness Admitted with AMS, recent difficulty walking; new diagnosis of lung Ca with bony mets (painful); imaging showing small R parietal CVA    PT Comments    Patient progressing with ambulation endurance and safety with walker.  Was able to negotiate stairs, but difficulty due vision changes with bifocal.  Able to simulate threshold for home entry.  Will continue skilled PT in acute until d/c home with HHPT follow up.    Follow Up Recommendations  Home health PT;Supervision/Assistance - 24 hour     Equipment Recommendations       Recommendations for Other Services       Precautions / Restrictions Precautions Precautions: Fall    Mobility  Bed Mobility               General bed mobility comments: up in recliner  Transfers   Equipment used: Rolling walker (2 wheeled) Transfers: Sit to/from Stand Sit to Stand: Supervision         General transfer comment: up from recliner with UE support  Ambulation/Gait Ambulation/Gait assistance: Supervision Ambulation Distance (Feet): 200 Feet Assistive device: Rolling walker (2 wheeled) Gait Pattern/deviations: Step-through pattern;Decreased stride length;Wide base of support     General Gait Details: only minimal SOB with ambulation supervision for safety and directional cues   Stairs Stairs: Yes   Stair Management: Two rails;Alternating pattern;Forwards Number of Stairs: 2 (x2) General stair comments: cues for step height due to hitting toes on rise of stair while ascending first attempt due to new bifocals (educated using sensation touching with feet first to measure depth); also simulated threhold in hallway stepping over glove boxes with RW and min A cues for technique  Wheelchair Mobility    Modified Rankin (Stroke Patients Only) Modified Rankin (Stroke Patients  Only) Pre-Morbid Rankin Score: Slight disability Modified Rankin: Moderately severe disability     Balance Overall balance assessment: Needs assistance   Sitting balance-Leahy Scale: Good     Standing balance support: Bilateral upper extremity supported Standing balance-Leahy Scale: Poor Standing balance comment: UE support in standing for safety                    Cognition Arousal/Alertness: Awake/alert Behavior During Therapy: WFL for tasks assessed/performed Overall Cognitive Status: Within Functional Limits for tasks assessed                      Exercises      General Comments        Pertinent Vitals/Pain Pain Score: 7  Pain Location: mid back pain Pain Descriptors / Indicators: Sharp;Tightness Pain Intervention(s): Monitored during session;Repositioned    Home Living                      Prior Function            PT Goals (current goals can now be found in the care plan section) Progress towards PT goals: Progressing toward goals    Frequency    Min 3X/week      PT Plan Current plan remains appropriate    Co-evaluation             End of Session Equipment Utilized During Treatment: Gait belt Activity Tolerance: Patient tolerated treatment well Patient left: in chair;with call bell/phone within reach;with family/visitor present   PT Visit Diagnosis: Other abnormalities of  gait and mobility (R26.89);Unsteadiness on feet (R26.81)     Time: 7185-5015 PT Time Calculation (min) (ACUTE ONLY): 24 min  Charges:  $Gait Training: 23-37 mins                    G Codes:       Reginia Naas 08/07/16, 1:50 PM  Magda Kiel, [place] 07-Aug-2016

## 2016-07-27 NOTE — Progress Notes (Signed)
Pts SBP was over 180 for 3 readings orders were to notify attending, I notified and informed that pt was also asymptomatic attending changed parameters for SBP to 200 for notification.

## 2016-07-27 NOTE — Progress Notes (Signed)
  Echocardiogram 2D Echocardiogram with Definity has been performed.  Rosana Farnell 07/27/2016, 11:23 AM

## 2016-07-28 ENCOUNTER — Inpatient Hospital Stay (HOSPITAL_COMMUNITY): Payer: Medicare Other

## 2016-07-28 ENCOUNTER — Encounter (HOSPITAL_COMMUNITY): Payer: Self-pay | Admitting: General Surgery

## 2016-07-28 LAB — VAS US CAROTID
LCCADDIAS: -16 cm/s
LCCAPDIAS: -14 cm/s
LCCAPSYS: -115 cm/s
LEFT ECA DIAS: -11 cm/s
LEFT VERTEBRAL DIAS: -16 cm/s
LICADDIAS: -33 cm/s
LICADSYS: -101 cm/s
LICAPSYS: -79 cm/s
Left CCA dist sys: -99 cm/s
Left ICA prox dias: -15 cm/s
RCCAPSYS: 115 cm/s
RIGHT ECA DIAS: -7 cm/s
RIGHT VERTEBRAL DIAS: 17 cm/s
Right CCA prox dias: 15 cm/s
Right cca dist sys: -93 cm/s

## 2016-07-28 LAB — PROTIME-INR
INR: 1.12
Prothrombin Time: 14.4 seconds (ref 11.4–15.2)

## 2016-07-28 LAB — APTT: aPTT: 29 seconds (ref 24–36)

## 2016-07-28 MED ORDER — ATORVASTATIN CALCIUM 20 MG PO TABS: 20.0000 mg | ORAL_TABLET | Freq: Every day | ORAL | 0 refills | Status: DC

## 2016-07-28 MED ORDER — FENTANYL CITRATE (PF) 100 MCG/2ML IJ SOLN
INTRAMUSCULAR | Status: AC
  Filled 2016-07-28: qty 4

## 2016-07-28 MED ORDER — HYDRALAZINE HCL 25 MG PO TABS: 25.0000 mg | ORAL_TABLET | Freq: Three times a day (TID) | ORAL | 0 refills | Status: DC

## 2016-07-28 MED ORDER — MORPHINE SULFATE ER 15 MG PO TBCR: 15.0000 mg | EXTENDED_RELEASE_TABLET | Freq: Two times a day (BID) | ORAL | 0 refills | Status: DC

## 2016-07-28 MED ORDER — POLYETHYLENE GLYCOL 3350 17 G PO PACK: 17.0000 g | PACK | Freq: Two times a day (BID) | ORAL | 0 refills | Status: DC

## 2016-07-28 MED ORDER — MIDAZOLAM HCL 2 MG/2ML IJ SOLN
INTRAMUSCULAR | Status: AC
  Filled 2016-07-28: qty 4

## 2016-07-28 MED ORDER — SENNOSIDES-DOCUSATE SODIUM 8.6-50 MG PO TABS: 2.0000 | ORAL_TABLET | Freq: Two times a day (BID) | ORAL | 0 refills | Status: AC

## 2016-07-28 MED ORDER — GUAIFENESIN ER 600 MG PO TB12: 1200.0000 mg | ORAL_TABLET | Freq: Two times a day (BID) | ORAL | 0 refills | Status: DC

## 2016-07-28 MED ORDER — AMLODIPINE BESYLATE 10 MG PO TABS: 10.0000 mg | ORAL_TABLET | Freq: Every day | ORAL | 0 refills | Status: DC

## 2016-07-28 MED ORDER — DEXAMETHASONE 2 MG PO TABS: 2.0000 mg | ORAL_TABLET | Freq: Three times a day (TID) | ORAL | 0 refills | Status: DC

## 2016-07-28 MED ORDER — FENTANYL CITRATE (PF) 100 MCG/2ML IJ SOLN
INTRAMUSCULAR | Status: AC | PRN
  Administered 2016-07-28: 25 ug via INTRAVENOUS
  Administered 2016-07-28: 50 ug via INTRAVENOUS
  Administered 2016-07-28: 25 ug via INTRAVENOUS

## 2016-07-28 MED ORDER — ASPIRIN 325 MG PO TABS: 325.0000 mg | ORAL_TABLET | Freq: Every day | ORAL | 0 refills | Status: DC

## 2016-07-28 MED ORDER — MIDAZOLAM HCL 2 MG/2ML IJ SOLN
INTRAMUSCULAR | Status: AC | PRN
  Administered 2016-07-28: 0.5 mg via INTRAVENOUS
  Administered 2016-07-28: 1 mg via INTRAVENOUS
  Administered 2016-07-28: 0.5 mg via INTRAVENOUS

## 2016-07-28 MED ORDER — LIDOCAINE HCL 1 % IJ SOLN
INTRAMUSCULAR | Status: AC
  Filled 2016-07-28: qty 20

## 2016-07-28 NOTE — Discharge Summary (Signed)
Physician Discharge Summary  Warren Odonnell OMV:[id number] DOB: 1945/10/18 DOA: 07/24/2016  PCP: Shirline Frees, MD  Admit date: 07/24/2016 Discharge date: 07/28/2016  Admitted From: Home  Disposition: Home   Recommendations for Outpatient Follow-up:  1. Follow up with PCP in 1-2 weeks 2. Please obtain BMP/CBC in one week 3. Follow up wit radiation oncology.  4. Keep appointment for PET scan.      Discharge Condition: Stable.  CODE STATUS: Full code.  Diet recommendation: Heart Healthy   Brief/Interim Summary: 71 y.o.malewith a past medical history significant for newly diagnosed lung mass and widespread bone metastaseswho presents with delirium Thought to be secondary to recently started MS Contin, and steroids for pain control, as well MRI brain was significant for acute CVA,Seen by nephrology, this was thought to be incidental findings  Acute Stroke: - MRI- 9 mm acute ischemic small vessel white matter infarct within the subcortical/deep white matter of the right parietal lobe - Neurology input  with greatly appreciated,This is thought to be incidental and symptomatic findings continue with aspirin 325 mg daily,  - Carotid  Dopplers Findings are consistent with a 1-39 percent stenosis involving the right internal carotid artery and the left internal carotid artery. The vertebral arteries demonstrate antegrade flow. - 2-D echo; Normal LV size with mild LV hypertrophy. EF 55-60%. Mildly dilated RV with normal systolic function. No significant valvular abnormalities. - Transcranial Doppler pending - LDL in 97 - hemoglobin A1c is 5.2,   Delirium: -This actually is the primary complaint, not clear that this is related to his infarct.  this is most likely related to his narcotic and steroids use , Decadron dose has been lowered, MS Contin dose has been decreased by half,  - Significant improvement of mental status, back to baseline today  Suspected Metastatic  lung  cancer: - Patient has radiation therapy appointment with Dr. Tammi Klippel on Monday. Discussed with his PA today, they will reschedule appointment for later this week. - Has PET scan appointment at [facility] on Thursday, MRI cervical/thoracic spine significant for Pathologic vertebral body fractures at C7 and T6 with retropulsion and small volume epidural tumor resulting in mild spinal stenosis, Dr Emeline Gins discussed with radiation oncology, request biopsy. IR consulted regarding biopsy. -patient underwent L1 biopsy today. Ok to resume heparin per IR  - pain is currently controlled on current regimen, Continue Decadron, Morphine, Norco, Diflucan.  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   aspirin 325 MG tablet Take 1 tablet (325 mg total) by mouth daily.   atorvastatin 20 MG tablet Commonly known as:  LIPITOR Take 1 tablet (20 mg total) by mouth daily at 6 PM.   bimatoprost 0.01 % Soln Commonly known as:  LUMIGAN Place 1 drop into both eyes at bedtime.   dexamethasone 2 MG tablet Commonly known as:  DECADRON Take 1 tablet (2 mg total) by mouth every 8 (eight) hours. What changed:  medication strength  how  much to take  when to take this   dorzolamide 2 % ophthalmic solution Commonly known as:  TRUSOPT Place 1 drop into both eyes 2 (two) times daily.   esomeprazole 20 MG capsule Commonly known as:  NEXIUM Take 20 mg by mouth daily.   fluconazole 100 MG tablet Commonly known as:  DIFLUCAN Take 1 tablet (100 mg total) by mouth daily.   guaiFENesin 600 MG 12 hr tablet Commonly known as:  MUCINEX Take 2 tablets (1,200 mg total) by mouth 2 (two) times daily.   hydrALAZINE 25 MG tablet Commonly known as:  APRESOLINE Take 1 tablet (25 mg total) by mouth every 8 (eight) hours.   HYDROcodone-acetaminophen 5-325 MG tablet Commonly known as:  NORCO/VICODIN Take 1 tablet by mouth 3 (three) times daily as needed for moderate pain.   morphine 15 MG 12 hr tablet Commonly known as:  MS CONTIN Take 1 tablet (15 mg total) by mouth every 12 (twelve) hours. What changed:  medication strength  how much to take   multivitamin with minerals Tabs tablet Take 1 tablet by mouth daily.   ondansetron 8 MG tablet Commonly known as:  ZOFRAN Take 8 mg by mouth every 8 (eight) hours as needed for nausea or vomiting.   polyethylene glycol packet Commonly known as:  MIRALAX / GLYCOLAX Take 17 g by mouth 2 (two) times daily.   PRESERVISION AREDS 2+MULTI VIT Caps Take 1 capsule by mouth 2 (two) times daily.   prochlorperazine 10 MG tablet Commonly known as:  COMPAZINE Take 1 tablet (10 mg total) by mouth every 6 (six) hours as needed for nausea or vomiting.   senna-docusate 8.6-50 MG tablet Commonly known as:  Senokot-S Take 2 tablets by mouth 2 (two) times daily.   triamcinolone cream 0.1 % Commonly known as:  KENALOG Apply 1 application topically 2 (two) times daily as needed (for rash).            Durable Medical Equipment        Start     Ordered   07/27/16 1435  For home use only DME 3 n 1  Once     07/27/16 1434   07/27/16 1435  For home use only DME Walker rolling  Once     Question:  Patient needs a walker to treat with the following condition  Answer:  Stroke [facility])   07/27/16 1434     Follow-up Information    Shirline Frees, MD Follow up in 2 week(s).   Specialty:  Family Medicine Contact information: [address] [place] [address] [place], PA-C Follow up.   Specialty:  Oncology Why:  keep appointment.  Contact information: [place] [id number] [phone]          No Known Allergies  Consultations:  Neurology  Radiation oncology.   IR    Procedures/Studies: Ct Chest W Contrast  Result Date: 07/15/2016 CLINICAL DATA:  Low back and left hip pain. Multiple bony lesions most consistent with metastatic disease or myeloma by prior MRI. EXAM: CT  CHEST, ABDOMEN, AND PELVIS WITH CONTRAST TECHNIQUE: Multidetector CT imaging of the chest, abdomen and pelvis was performed following the standard protocol during bolus administration of intravenous contrast. CONTRAST:  100 ml ISOVUE-300 IOPAMIDOL (ISOVUE-300) INJECTION 61% COMPARISON:  None. MRI lumbar spine 07/03/2016. FINDINGS: CT CHEST FINDINGS Cardiovascular: Heart size normal. No pericardial effusion. Calcific aortic atherosclerosis noted. Mediastinum/Nodes: There is mediastinal lymphadenopathy. Node just anterior to the carina eccentric to the right on image 42 measures 1.6 cm by 2.6 cm. Right hilar lymph node on image 48 measures 2.6 by 2.2 cm. Lungs/Pleura: Small right pleural effusion is identified. There is mild thickening and nodularity of the superior aspect of the pleura along right major fissure. No discrete pleural nodule is identified. Solid mass in the right lower lobe on image 80 measures 2.3 x 3.3 cm. Mild dependent atelectasis is seen on the left. Musculoskeletal: Multiple lytic lesions are identified in the thoracic spine. Largest lesion is in T6 where there is a pathologic compression fracture deformity with some  bony retropulsion. No obvious stenosis. Lytic lesions are also identified in scattered ribs, see for example the posterior arc of the right fifth rib on image 29. CT ABDOMEN PELVIS FINDINGS Hepatobiliary: No focal liver abnormality is seen. No gallstones, gallbladder wall thickening, or biliary dilatation. Pancreas: Unremarkable. No pancreatic ductal dilatation or surrounding inflammatory changes. Spleen: Normal in size without focal abnormality. Adrenals/Urinary Tract: Parapelvic renal cysts are seen and more prominent on the left. No solid renal lesion is identified. Ureters and urinary bladder appear normal. The adrenal glands are normal in appearance. Stomach/Bowel: Sigmoid diverticulosis without diverticulitis is noted. The colon is otherwise unremarkable. Stomach, small bowel and appendix appear normal. Vascular/Lymphatic: No significant vascular findings are present. No enlarged abdominal or pelvic lymph nodes. Reproductive: Prostate is unremarkable. Other: No abdominal wall hernia or abnormality. No abdominopelvic ascites. 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 Large lytic lesions are also seen in the posterior right ilium and sacrum. No fracture. 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 Pathologic T6 fracture with bony retropulsion but no obvious central canal stenosis. Evaluation for epidural tumor is limited on CT scan. If the patient has myelopathic symptoms, MRI of the thoracic spine with and without contrast could be used for further evaluation. Calcific aortic and coronary atherosclerosis. Sigmoid diverticulosis without diverticulitis. Electronically Signed   By: Inge Rise M.D.   On: 07/15/2016 10:36   Ct Guided Needle  Placement  Result Date: 07/28/2016 CLINICAL DATA:  Lung mass, multiple osseous lytic lesions. EXAM: CT GUIDED CORE BIOPSY OF  L1 SPINOUS PROCESS  MASS ANESTHESIA/SEDATION: Intravenous Fentanyl and Versed were administered as conscious sedation during continuous monitoring of the patient's level of consciousness and physiological / cardiorespiratory status by the radiology RN, with a total moderate sedation time of 10 minutes. PROCEDURE: The procedure risks, benefits, and alternatives were explained to the patient. Questions regarding the procedure were encouraged and answered. The patient understands and consents to the procedure. Patient placed prone. Select axial scans through the mid abdomen obtained. The lytic L1 spinous process lesion was localized and an appropriate skin entry site determined and marked. The operative field was prepped with chlorhexidinein a sterile fashion, and a sterile drape was applied covering the operative field. A sterile gown and sterile gloves were used for the procedure. Local  anesthesia was provided with 1% Lidocaine. Under CT fluoroscopic guidance, a 17 gauge trocar needle was advanced to the margin of the lesion. Once needle tip position was confirmed, coaxial 18-gauge core biopsy samples were obtained, submitted in formalin to surgical pathology. The guide needle was removed. Postprocedure scans show no hemorrhage or other apparent complication. The patient tolerated the procedure well. COMPLICATIONS: None immediate FINDINGS: The lytic soft tissue attenuation L1 lesion was localized. 6 solid-appearing 2 cm 18 gauge core biopsy samples were obtained under CT guidance as above. IMPRESSION: 1. Technically successful CT-guided core biopsy, left L1 spinous process mass. Electronically Signed   By: Lucrezia Europe M.D.   On: 07/28/2016 10:43   Mr Jeri Cos MB Contrast  Result Date: 07/24/2016 CLINICAL DATA:  Initial evaluation for acute altered mental status. History of lung cancer with  osseous metastases. EXAM: MRI HEAD WITHOUT AND WITH CONTRAST TECHNIQUE: Multiplanar, multiecho pulse sequences of the brain and surrounding structures were obtained without and with intravenous contrast. CONTRAST:  69m MULTIHANCE GADOBENATE DIMEGLUMINE 529 MG/ML IV SOLN COMPARISON:  Prior MRI from 05/13/2016. FINDINGS: Brain: Study mildly degraded by motion artifact. Mild diffuse prominence of the CSF containing spaces is compatible with generalized age-related cerebral atrophy. Patchy and confluent T2/FLAIR hyperintensity within the periventricular and deep white matter both cerebral hemispheres, most consistent with chronic microvascular ischemic disease, mildly progressed relative to previous. There is a 9 mm focus of linear restricted diffusion within the subcortical/deep white matter of the right parietal lobe, compatible with a small acute ischemic small vessel infarct (series 4, image 33). No associated mass effect or hemorrhage. No other evidence for acute for subacute ischemia. Gray-white matter differentiation otherwise maintained. No evidence for acute or chronic intracranial hemorrhage. No other evidence for chronic infarction. No mass lesion, midline shift or mass effect. No hydrocephalus. No extra-axial fluid collection. Major dural sinuses are grossly patent. No abnormal enhancement. Pituitary gland and suprasellar region within normal limits. Vascular: Major intracranial vascular flow voids are maintained. Skull and upper cervical spine: Abnormal signal intensity within the left occipital condyle with associated enhancement, likely an osseous metastatic lesion (series 11, image 7). Possible additional osseous metastatic lesion within the C3 vertebral body (series 13, image 14), not well evaluated on this exam. No significant extraosseous tumor. No stenosis within the upper cervical spine. No calvarial metastatic lesions identified. Sinuses/Orbits: Globes and orbital soft tissues demonstrate no acute  abnormality. Axial myopia noted. Paranasal sinuses are clear. Small bilateral mastoid effusions, slightly larger on the left. Inner ear structures grossly unremarkable. IMPRESSION: 1. 9 mm acute ischemic small vessel white matter infarct within the subcortical/deep white matter of the right parietal lobe. No associated hemorrhage or mass effect. 2. No other acute intracranial process identified. 3. Abnormal signal intensity within the left occipital condyle, likely an osseous metastasis given provided history. No significant extra osseous extension of tumor. 4. Possible additional osseous metastatic lesion within the C3 vertebral body. Further evaluation with dedicated MRI of the cervical spine suggested for complete evaluation. 5. No other MRI evidence for intracranial metastatic disease. 6. Mild to moderate chronic microvascular ischemic disease, mildly progressed relative to 2017. Electronically Signed   By: BJeannine BogaM.D.   On: 07/24/2016 23:06   Mr Lumbar Spine W/o Contrast  Result Date: 07/03/2016 CLINICAL DATA:  Low back and left hip pain for the last 6 weeks. EXAM: MRI LUMBAR SPINE WITHOUT CONTRAST TECHNIQUE: Multiplanar, multisequence MR imaging of the lumbar spine was performed. No intravenous contrast was administered. COMPARISON:  06/03/2016 radiographs FINDINGS: Segmentation: The lowest lumbar type non-rib-bearing vertebra is labeled as L5. Alignment:  No vertebral subluxation is observed. Vertebrae: Low T1 signal intensity lesions scattered in the lumbar spine suspicious for osseous metastatic disease. Among these is a 2.7 by 2.0 cm lesion along the left posterior inferior endplate of L1 which significantly expands the vertebral body posteriorly into the left neural foramen, and may be associated with local epidural tumor. This is accompanied by a 2.6 by 1.9 cm lesion in the spinous process at this level and other metastatic lesions also in the vertebral body. The similar lesion is are  present in the L2 vertebral body, and in the L3 spinous process, in the L5 vertebral body and spinous process, and along the upper S1 margin. Several other smaller scattered lesions are also present. Neoplastic lesions with low T1 and high T2 signal are present in both iliac bones. Conus medullaris: Extends to the L1 level and appears normal. Paraspinal and other soft tissues: No retroperitoneal adenopathy identified. Parapelvic renal cysts. Disc levels: T12-L1: Unremarkable. L1-2: The bony vertebral expansion/ tumor along the left posterior vertebral body is questionably extending into the epidural space and causes mild left foraminal stenosis and mild displacement of the left L2 nerves in the lateral extraforaminal space. Expansile lytic lesion of the posterior elements. L2-3: Mild right foraminal stenosis and mild displacement of the left L2 nerve in the lateral extraforaminal space due to left lateral extraforaminal disc protrusion along with facet arthropathy and disc bulge. L3-4: Mild displacement of the right L3 nerve in the lateral extraforaminal space due to right lateral extraforaminal disc protrusion. Mild disc bulge. L4-5:  No impingement.  Mild disc bulge. L5-S1:  Unremarkable. IMPRESSION: 1. There are scattered expansile likely lytic lesions in the lumbar vertebra, sacrum, and iliac bones compatible with either multiple myeloma or extensive osseous metastatic disease. An expansile bony lesion with possible epidural involvement along the left posterior L1 vertebral body extends into the neural foramen and contributes to the left foraminal stenosis at L1- 2. Further workup for malignancy is recommended. 2. Lumbar spondylosis and degenerative disc disease also cause mild impingement at L2- 3 and L3-4. These results will be called to the ordering clinician or representative by the Radiologist Assistant, and communication documented in the PACS or zVision Dashboard. Electronically Signed   By: Van Clines M.D.   On: 07/03/2016 13:52   Mr Cervical Spine W Wo Contrast  Result Date: 07/26/2016 CLINICAL DATA:  Metastatic lung cancer. EXAM: MRI CERVICAL AND THORACIC SPINE WITHOUT AND WITH CONTRAST TECHNIQUE: Multiplanar and multiecho pulse sequences of the cervical spine, to include the craniocervical junction and cervicothoracic junction, and thoracic spine, were obtained without and with intravenous contrast. CONTRAST:  1m MULTIHANCE GADOBENATE DIMEGLUMINE 529 MG/ML IV SOLN COMPARISON:  Brain MRI 07/24/2016. Lumbar spine MRI 07/03/2016. CT chest, abdomen, and pelvis 07/25/2016. FINDINGS: MRI CERVICAL SPINE FINDINGS Alignment: Cervical spine straightening.  No listhesis. Vertebrae: Numerous T2 hyperintense enhancing lesions involving the vertebral bodies and posterior elements of the cervical spine consistent with known osseous metastatic disease. There is involvement of the left occipital condyle as described on recent brain MRI. There is a pathologic C7 vertebral body compression fracture with severe vertebral body height loss centrally. C7 posterior vertebral body retropulsion and ventral epidural tumor result in mild spinal stenosis with slight ventral cord flattening. Expansile lesion in the posterior right C3 vertebral body with possible tiny amount of noncompressive epidural tumor. Cord: Normal signal. Posterior Fossa, vertebral arteries, paraspinal  tissues: Unremarkable. Disc levels: Mild right neural foraminal stenosis at C4-5 due to expansile posterior element osseous metastasis at C5. Moderate left neural foraminal stenosis at C6-7 and mild left foraminal stenosis at C7-T1 due to expansile C7 tumor. MRI THORACIC SPINE FINDINGS Alignment:  Normal. Vertebrae: Numerous T2 hyperintense enhancing lesions throughout the vertebral bodies and posterior elements of the thoracic spine. Partially visualized sternal and rib metastases. Small volume ventral epidural tumor predominantly on the right at T2.  Pathologic T6 compression fracture with severe height loss centrally and anteriorly and small volume ventral epidural tumor on the right contributing to mild spinal stenosis without spinal cord compression. Partially visualized expansile lesion in the posterior left L1 vertebral body with epidural tumor as seen on prior lumbar spine MRI narrowing the left lateral recess and left L1-2 neural foramen. Possible tiny amount of left ventral epidural tumor at T8. Cord: Normal signal. Paraspinal and other soft tissues: Moderate right pleural effusion. Disc levels: Preserved disc space heights. No disc herniation. Spinal and neural foraminal narrowing due to osseous an epidural tumor as described above. IMPRESSION: 1. Widespread osseous metastases. 2. Pathologic vertebral body fractures at C7 and T6 with retropulsion and small volume epidural tumor resulting in mild spinal stenosis. These results will be called to the ordering clinician or representative by the Radiologist Assistant, and communication documented in the PACS or zVision Dashboard. Electronically Signed   By: Logan Bores M.D.   On: 07/26/2016 20:02   Mr Thoracic Spine W Wo Contrast  Result Date: 07/26/2016 CLINICAL DATA:  Metastatic lung cancer. EXAM: MRI CERVICAL AND THORACIC SPINE WITHOUT AND WITH CONTRAST TECHNIQUE: Multiplanar and multiecho pulse sequences of the cervical spine, to include the craniocervical junction and cervicothoracic junction, and thoracic spine, were obtained without and with intravenous contrast. CONTRAST:  57m MULTIHANCE GADOBENATE DIMEGLUMINE 529 MG/ML IV SOLN COMPARISON:  Brain MRI 07/24/2016. Lumbar spine MRI 07/03/2016. CT chest, abdomen, and pelvis 07/25/2016. FINDINGS: MRI CERVICAL SPINE FINDINGS Alignment: Cervical spine straightening.  No listhesis. Vertebrae: Numerous T2 hyperintense enhancing lesions involving the vertebral bodies and posterior elements of the cervical spine consistent with known osseous metastatic  disease. There is involvement of the left occipital condyle as described on recent brain MRI. There is a pathologic C7 vertebral body compression fracture with severe vertebral body height loss centrally. C7 posterior vertebral body retropulsion and ventral epidural tumor result in mild spinal stenosis with slight ventral cord flattening. Expansile lesion in the posterior right C3 vertebral body with possible tiny amount of noncompressive epidural tumor. Cord: Normal signal. Posterior Fossa, vertebral arteries, paraspinal tissues: Unremarkable. Disc levels: Mild right neural foraminal stenosis at C4-5 due to expansile posterior element osseous metastasis at C5. Moderate left neural foraminal stenosis at C6-7 and mild left foraminal stenosis at C7-T1 due to expansile C7 tumor. MRI THORACIC SPINE FINDINGS Alignment:  Normal. Vertebrae: Numerous T2 hyperintense enhancing lesions throughout the vertebral bodies and posterior elements of the thoracic spine. Partially visualized sternal and rib metastases. Small volume ventral epidural tumor predominantly on the right at T2. Pathologic T6 compression fracture with severe height loss centrally and anteriorly and small volume ventral epidural tumor on the right contributing to mild spinal stenosis without spinal cord compression. Partially visualized expansile lesion in the posterior left L1 vertebral body with epidural tumor as seen on prior lumbar spine MRI narrowing the left lateral recess and left L1-2 neural foramen. Possible tiny amount of left ventral epidural tumor at T8. Cord: Normal signal. Paraspinal and other soft tissues: Moderate  right pleural effusion. Disc levels: Preserved disc space heights. No disc herniation. Spinal and neural foraminal narrowing due to osseous an epidural tumor as described above. IMPRESSION: 1. Widespread osseous metastases. 2. Pathologic vertebral body fractures at C7 and T6 with retropulsion and small volume epidural tumor resulting  in mild spinal stenosis. These results will be called to the ordering clinician or representative by the Radiologist Assistant, and communication documented in the PACS or zVision Dashboard. Electronically Signed   By: Logan Bores M.D.   On: 07/26/2016 20:02   Ct Abdomen Pelvis W Contrast  Result Date: 07/15/2016 CLINICAL DATA:  Low back and left hip pain. Multiple bony lesions most consistent with metastatic disease or myeloma by prior MRI. EXAM: CT CHEST, ABDOMEN, AND PELVIS WITH CONTRAST TECHNIQUE: Multidetector CT imaging of the chest, abdomen and pelvis was performed following the standard protocol during bolus administration of intravenous contrast. CONTRAST:  100 ml ISOVUE-300 IOPAMIDOL (ISOVUE-300) INJECTION 61% COMPARISON:  None. MRI lumbar spine 07/03/2016. FINDINGS: CT CHEST FINDINGS Cardiovascular: Heart size normal. No pericardial effusion. Calcific aortic atherosclerosis noted. Mediastinum/Nodes: There is mediastinal lymphadenopathy. Node just anterior to the carina eccentric to the right on image 42 measures 1.6 cm by 2.6 cm. Right hilar lymph node on image 48 measures 2.6 by 2.2 cm. Lungs/Pleura: Small right pleural effusion is identified. There is mild thickening and nodularity of the superior aspect of the pleura along right major fissure. No discrete pleural nodule is identified. Solid mass in the right lower lobe on image 80 measures 2.3 x 3.3 cm. Mild dependent atelectasis is seen on the left. Musculoskeletal: Multiple lytic lesions are identified in the thoracic spine. Largest lesion is in T6 where there is a pathologic compression fracture deformity with some bony retropulsion. No obvious stenosis. Lytic lesions are also identified in scattered ribs, see for example the posterior arc of the right fifth rib on image 29. CT ABDOMEN PELVIS FINDINGS Hepatobiliary: No focal liver abnormality is seen. No gallstones, gallbladder wall thickening, or biliary dilatation. Pancreas: Unremarkable. No  pancreatic ductal dilatation or surrounding inflammatory changes. Spleen: Normal in size without focal abnormality. Adrenals/Urinary Tract: Parapelvic renal cysts are seen and more prominent on the left. No solid renal lesion is identified. Ureters and urinary bladder appear normal. The adrenal glands are normal in appearance. Stomach/Bowel: Sigmoid diverticulosis without diverticulitis is noted. The colon is otherwise unremarkable. Stomach, small bowel and appendix appear normal. Vascular/Lymphatic: No significant vascular findings are present. No enlarged abdominal or pelvic lymph nodes. Reproductive: Prostate is unremarkable. Other: No abdominal wall hernia or abnormality. No abdominopelvic ascites. 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 Large lytic lesions are also seen in the posterior right ilium and sacrum. No fracture. 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 Pathologic T6 fracture with bony retropulsion but no obvious central canal stenosis. Evaluation for epidural tumor is limited on CT scan. If the patient has myelopathic symptoms, MRI of the thoracic spine with and without contrast could be used for further evaluation. Calcific aortic and coronary atherosclerosis. Sigmoid diverticulosis without diverticulitis. Electronically Signed   By: Inge Rise M.D.   On: 07/15/2016 10:36      Subjective: He is doing ok, denies worsening pain. No weakness. Back to baseline.   Discharge Exam: Vitals:   07/28/16 1005 07/28/16 1012  BP: Marland Kitchen)  130/91 (!) 144/89  Pulse: 83 86  Resp: 14 16  Temp:     Vitals:   07/28/16 0959 07/28/16 1002 07/28/16 1005 07/28/16 1012  BP: (!) 162/95 (!) 131/91 (!) 130/91 (!) 144/89  Pulse: 98 96 83 86  Resp: '17  16 14 16  ' Temp:      TempSrc:      SpO2: 100% 94% 96% 95%  Weight:      Height:        General: Pt is alert, awake, not in acute distress Cardiovascular: RRR, S1/S2 +, no rubs, no gallops Respiratory: CTA bilaterally, no wheezing, no rhonchi Abdominal: Soft, NT, ND, bowel sounds + Extremities: no edema, no cyanosis    The results of significant diagnostics from this hospitalization (including imaging, microbiology, ancillary and laboratory) are listed below for reference.     Microbiology: No results found for this or any previous visit (from the past 240 hour(s)).   Labs: BNP (last 3 results) No results for input(s): BNP in the last 8760 hours. Basic Metabolic Panel:  Recent Labs Lab 07/23/16 0854 07/24/16 1933 07/26/16 0542  NA 139 139 137  K 3.7 4.2 5.1  CL  --  106 105  CO2 '22 24 26  ' GLUCOSE 131 143* 121*  BUN 23.0 28* 21*  CREATININE 1.0 1.03 0.98  CALCIUM 10.3 9.5 9.6   Liver Function Tests:  Recent Labs Lab 07/23/16 0854 07/24/16 1933  AST 32 34  ALT 48 43  ALKPHOS 121 117  BILITOT 0.65 0.6  PROT 6.7 6.8  ALBUMIN 2.9* 3.1*   No results for input(s): LIPASE, AMYLASE in the last 168 hours.  Recent Labs Lab 07/25/16 1342  AMMONIA 14   CBC:  Recent Labs Lab 07/23/16 0854 07/24/16 1933 07/26/16 0542  WBC 11.4* 13.4* 12.4*  NEUTROABS 8.9* 12.3*  --   HGB 12.3* 11.4* 11.5*  HCT 35.8* 34.4* 36.4*  MCV 91.6 92.0 94.8  PLT 503* 542* 558*   Cardiac Enzymes: No results for input(s): CKTOTAL, CKMB, CKMBINDEX, TROPONINI in the last 168 hours. BNP: Invalid input(s): POCBNP CBG: No results for input(s): GLUCAP in the last 168 hours. D-Dimer No results for input(s): DDIMER in the last 72 hours. Hgb A1c No results for input(s): HGBA1C in the last 72 hours. Lipid Profile No results for input(s): CHOL, HDL, LDLCALC, TRIG, CHOLHDL, LDLDIRECT in the last 72 hours. Thyroid function studies No results for input(s): TSH, T4TOTAL, T3FREE, THYROIDAB  in the last 72 hours.  Invalid input(s): FREET3 Anemia work up No results for input(s): VITAMINB12, FOLATE, FERRITIN, TIBC, IRON, RETICCTPCT in the last 72 hours. Urinalysis    Component Value Date/Time   COLORURINE YELLOW 05/09/2016 1107   APPEARANCEUR CLEAR 05/09/2016 1107   LABSPEC 1.017 05/09/2016 1107   PHURINE 7.0 05/09/2016 1107   GLUCOSEU NEGATIVE 05/09/2016 1107   HGBUR NEGATIVE 05/09/2016 1107   BILIRUBINUR NEGATIVE 05/09/2016 1107   KETONESUR 15 (A) 05/09/2016 1107   PROTEINUR NEGATIVE 05/09/2016 1107   NITRITE NEGATIVE 05/09/2016 1107   LEUKOCYTESUR TRACE (A) 05/09/2016 1107   Sepsis Labs Invalid input(s): PROCALCITONIN,  WBC,  LACTICIDVEN Microbiology No results found for this or any previous visit (from the past 240 hour(s)).   Time coordinating discharge: Over 30 minutes  SIGNED:   Elmarie Shiley, MD  [facility] 07/28/2016, 4:39 PM Pager [phone]  If 7PM-7AM, please contact night-coverage [email or web address] Password [email or web address]

## 2016-07-28 NOTE — Consult Note (Addendum)
Radiation Oncology         [phone] ________________________________  Initial inpatient Consultation  Name: Warren Odonnell MRN: [id number]  Date: 07/27/16  DOB: 04-08-1946  EG:BTDVVO, Gwyndolyn Saxon, MD  No ref. provider found   REFERRING PHYSICIAN: No ref. provider found  DIAGNOSIS: The primary encounter diagnosis was Cerebrovascular accident (CVA), unspecified mechanism ([place]). 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    ICD-9-CM ICD-10-CM   1. Cerebrovascular accident (CVA), unspecified mechanism ([place]) 434.91 I63.9   2. Altered mental status 780.97 R41.82 MR BRAIN W WO CONTRAST     MR BRAIN W WO CONTRAST  3. Hallucination 780.1 R44.3   4. Confusion 298.9 R41.0   5. 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 Lung cancer ([place]) 162.9 C34.90 CT guided needle placement     CT guided needle placement    HISTORY OF PRESENT ILLNESS: Warren Odonnell is a 71 y.o. male seen at the request of Dr. Waldron Labs for a new probable lung cancer. The patient has been having trouble with persistent cough and productive white-yellow mucous, and low back pain since November 2017. He was seen by  Dr. Garnette Czech and MRI of the lumbar spine without contrast was performed on 07/03/2016 and it showed scattered expansile likely lytic lesions in the lumbar vertebrae, sacrum and iliac bones compatible with either multiple myeloma or extensive osseous metastatic disease. An expansile bony lesion with possible epidural involvement along the left procedure in detail 1 vertebral body extending  into the neural foramen and contributes to the left foraminal stenosis at L1-2. The patient was seen by his primary care physician Dr. Kenton Kingfisher and CT scan of the chest, abdomen and pelvis were performed on 07/15/2016 and it showed solid mass in the right lower lobe measuring 2.3 x 3.3 cm. There was also mediastinal lymphadenopathy including a node just anterior to the carina eccentric to the right and measured 1.6 x 2.6 cm. There was right hilar lymph node measuring 2.6 x 2.2 cm. There was also multiple lytic lesions are identified in the thoracic spine, largest lesion is in T6 with a pathologic compression fracture deformity with some bony retropulsion. There was also lytic lesions identified and is scattered drips including the posterior arc of the right fifth rib. He was scheduled to see [place] as an outpatient to discuss options for palliative radiotherapy for his back pain. He is currently scheduled for outpatient PET on Thursday of this week, and biopsy has not been scheduled.   He presented however to the ED on 07/25/16 after family had noticed changes in mental status with hallucinations, and delirium. An MRI of the brain revealed a new subcentimeter right parietal white matter infarct without evidence of metastatic disease. His mental status has improved, and we are asked to see him to discuss options for palliative radiotherapy. He has also undergone MRI of the cervical and thoracic spine revealing concerns for pathologic vertebral body fractures at C7, and T6 with retropulsion and small volume epidural tumor with mild spinal stenosis without cord edema.   PREVIOUS RADIATION THERAPY: No  PAST MEDICAL HISTORY:  Past Medical History:  Diagnosis Date  . Bone metastases ([place]) 07/23/2016  . 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 Glaucoma   . Testicular swelling   . Weight loss 07/24/2016      PAST SURGICAL HISTORY: Past Surgical History:  Procedure Laterality Date  . HERNIA REPAIR  1984  . testicular  torsion Left     FAMILY HISTORY: No family history on file.  SOCIAL HISTORY:  Social History   Social History  . Marital status: Married    Spouse name: N/A  . Number of children: N/A  . Years of education: N/A   Occupational History  . Not on file.   Social History Main Topics  . Smoking status: Never Smoker  . Smokeless tobacco: Never Used  . Alcohol use No  . Drug use: No  . Sexual activity: Not Currently   Other Topics Concern  . Not on file   Social History Narrative  . No narrative on file    ALLERGIES: Patient has no known allergies.  MEDICATIONS:  Current Facility-Administered Medications  Medication Dose Route Frequency Provider Last Rate Last Dose  . acetaminophen (TYLENOL) tablet 650 mg  650 mg Oral Q4H PRN Edwin Dada, MD   650 mg at 07/26/16 0254   Or  . acetaminophen (TYLENOL) solution 650 mg  650 mg Per Tube Q4H PRN Edwin Dada, MD       Or  . acetaminophen (TYLENOL) suppository 650 mg  650 mg Rectal Q4H PRN Edwin Dada, MD      . alum & mag hydroxide-simeth (MAALOX/MYLANTA) 200-200-20 MG/5ML suspension 30 mL  30 mL Oral Q6H PRN Albertine Patricia, MD      . amLODipine (NORVASC) tablet 10 mg  10 mg Oral Daily Albertine Patricia, MD   10 mg at 07/27/16 0934  . aspirin suppository 300 mg  300 mg Rectal Daily Edwin Dada, MD       Or  . aspirin tablet 325 mg  325 mg Oral Daily Edwin Dada, MD   325 mg at 07/27/16 0934  . atorvastatin (LIPITOR) tablet 20 mg  20 mg Oral q1800 David L Rinehuls, PA-C   20 mg at 07/27/16 1840  . dexamethasone (DECADRON) tablet 2 mg  2 mg Oral Q8H Albertine Patricia, MD   2 mg at 07/28/16 0551  . [START ON 07/29/2016] enoxaparin (LOVENOX) injection 40 mg  40 mg Subcutaneous Daily Monia Sabal, PA-C      . fluconazole (DIFLUCAN) tablet 100 mg  100 mg Oral Daily Edwin Dada, MD   100 mg at 07/27/16 0934  . guaiFENesin (MUCINEX) 12 hr tablet 1,200 mg  1,200 mg Oral BID  Albertine Patricia, MD   1,200 mg at 07/27/16 2238  . hydrALAZINE (APRESOLINE) tablet 25 mg  25 mg Oral Q8H Albertine Patricia, MD   25 mg at 07/28/16 0551  . HYDROcodone-acetaminophen (NORCO/VICODIN) 5-325 MG per tablet 1 tablet  1 tablet Oral TID PRN Edwin Dada, MD   1 tablet at 07/28/16 0551  . MEDLINE mouth rinse  15 mL Mouth Rinse BID Fatima Blank, MD   15 mL at 07/27/16 0934  . morphine (MS CONTIN) 12 hr tablet 15 mg  15 mg Oral Q12H Edwin Dada, MD   15 mg at 07/27/16 2238  . pantoprazole (PROTONIX) EC tablet 40 mg  40 mg Oral Daily Albertine Patricia, MD   40 mg at 07/27/16 0934  . polyethylene  glycol (MIRALAX / GLYCOLAX) packet 17 g  17 g Oral BID Albertine Patricia, MD   17 g at 07/27/16 0933  . senna-docusate (Senokot-S) tablet 2 tablet  2 tablet Oral BID Albertine Patricia, MD   2 tablet at 07/27/16 2238  . thiamine (VITAMIN B-1) tablet 100 mg  100 mg Oral Daily Albertine Patricia, MD   100 mg at 07/27/16 7414    REVIEW OF SYSTEMS:  On review of systems, the patient reports that he is frustrated with the fact that we are moving quickly with recommendations, where he feels this process has been slow. He states that he continues to have cough and productive mucous without hemoptysis. He denies fevers, chills, night sweats. He has noticed fatigue and unintended weight changes. He denies any bowel or bladder disturbances, and denies abdominal pain, nausea or vomiting. He continues to have low back and mid back pain. He denies any loss of bowel/bladder control. He denies any loss of sensation of his upper, or lower extremities, or chest wall numbness/tingling, or weakness. A complete review of systems is obtained and is otherwise negative.    PHYSICAL EXAM:  Wt Readings from Last 3 Encounters:  07/25/16 168 lb 8 oz (76.4 kg)  07/23/16 171 lb 3.2 oz (77.7 kg)  06/29/16 180 lb (81.6 kg)   Temp Readings from Last 3 Encounters:  07/28/16 97.8 F (36.6 C) (Oral)    07/23/16 98.1 F (36.7 C) (Oral)  05/09/16 98.1 F (36.7 C) (Oral)   BP Readings from Last 3 Encounters:  07/28/16 (!) 165/77  07/23/16 (!) 170/85  06/29/16 (!) 162/78   Pulse Readings from Last 3 Encounters:  07/28/16 82  07/23/16 (!) 104  06/29/16 80   Pain Assessment Pain Score: Asleep/10  In general this is a tired appearing caucasian male in no acute distress. He is alert and oriented x4 and appropriate throughout the examination. HEENT reveals that the patient is normocephalic, atraumatic. EOMs are intact. PERRLA. Skin is intact without any evidence of gross lesions. Cardiopulmonary assessment is negative for acute distress and he exhibits normal effort. He has intact sensory perception to light touch of the upper and lower extremities. He has 5/5 strength of bilateral lower extremities.      KPS = 50  100 - Normal; no complaints; no evidence of disease. 90   - Able to carry on normal activity; minor signs or symptoms of disease. 80   - Normal activity with effort; some signs or symptoms of disease. 29   - Cares for self; unable to carry on normal activity or to do active work. 60   - Requires occasional assistance, but is able to care for most of his personal needs. 50   - Requires considerable assistance and frequent medical care. 59   - Disabled; requires special care and assistance. 69   - Severely disabled; hospital admission is indicated although death not imminent. 11   - Very sick; hospital admission necessary; active supportive treatment necessary. 10   - Moribund; fatal processes progressing rapidly. 0     - Dead  Karnofsky DA, Abelmann [place], Craver LS and Burchenal [facility] [phone]) The use of the nitrogen mustards in the palliative treatment of carcinoma: with particular reference to bronchogenic carcinoma Cancer 1 634-56  LABORATORY DATA:  Lab Results  Component Value Date   WBC 12.4 (H) 07/26/2016   HGB 11.5 (L) 07/26/2016   HCT 36.4 (L) 07/26/2016   MCV 94.8  07/26/2016   PLT  558 (H) 07/26/2016   Lab Results  Component Value Date   NA 137 07/26/2016   K 5.1 07/26/2016   CL 105 07/26/2016   CO2 26 07/26/2016   Lab Results  Component Value Date   ALT 43 07/24/2016   AST 34 07/24/2016   ALKPHOS 117 07/24/2016   BILITOT 0.6 07/24/2016     RADIOGRAPHY: Ct Chest W Contrast  Result Date: 07/15/2016 CLINICAL DATA:  Low back and left hip pain. Multiple bony lesions most consistent with metastatic disease or myeloma by prior MRI. EXAM: CT CHEST, ABDOMEN, AND PELVIS WITH CONTRAST TECHNIQUE: Multidetector CT imaging of the chest, abdomen and pelvis was performed following the standard protocol during bolus administration of intravenous contrast. CONTRAST:  100 ml ISOVUE-300 IOPAMIDOL (ISOVUE-300) INJECTION 61% COMPARISON:  None. MRI lumbar spine 07/03/2016. FINDINGS: CT CHEST FINDINGS Cardiovascular: Heart size normal. No pericardial effusion. Calcific aortic atherosclerosis noted. Mediastinum/Nodes: There is mediastinal lymphadenopathy. Node just anterior to the carina eccentric to the right on image 42 measures 1.6 cm by 2.6 cm. Right hilar lymph node on image 48 measures 2.6 by 2.2 cm. Lungs/Pleura: Small right pleural effusion is identified. There is mild thickening and nodularity of the superior aspect of the pleura along right major fissure. No discrete pleural nodule is identified. Solid mass in the right lower lobe on image 80 measures 2.3 x 3.3 cm. Mild dependent atelectasis is seen on the left. Musculoskeletal: Multiple lytic lesions are identified in the thoracic spine. Largest lesion is in T6 where there is a pathologic compression fracture deformity with some bony retropulsion. No obvious stenosis. Lytic lesions are also identified in scattered ribs, see for example the posterior arc of the right fifth rib on image 29. CT ABDOMEN PELVIS FINDINGS Hepatobiliary: No focal liver abnormality is seen. No gallstones, gallbladder wall thickening, or biliary  dilatation. Pancreas: Unremarkable. No pancreatic ductal dilatation or surrounding inflammatory changes. Spleen: Normal in size without focal abnormality. Adrenals/Urinary Tract: Parapelvic renal cysts are seen and more prominent on the left. No solid renal lesion is identified. Ureters and urinary bladder appear normal. The adrenal glands are normal in appearance. Stomach/Bowel: Sigmoid diverticulosis without diverticulitis is noted. The colon is otherwise unremarkable. Stomach, small bowel and appendix appear normal. Vascular/Lymphatic: No significant vascular findings are present. No enlarged abdominal or pelvic lymph nodes. Reproductive: Prostate is unremarkable. Other: No abdominal wall hernia or abnormality. No abdominopelvic ascites. 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 Large lytic lesions are also seen in the posterior right ilium and sacrum. No fracture. 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 Pathologic T6 fracture with bony retropulsion but no obvious central canal stenosis. Evaluation for epidural tumor is limited on CT scan. If the patient has myelopathic symptoms, MRI of the thoracic spine with and without contrast could be used for further evaluation. Calcific aortic and coronary atherosclerosis. Sigmoid diverticulosis without diverticulitis. Electronically Signed   By: Inge Rise M.D.   On: 07/15/2016 10:36   Mr Jeri Cos IW Contrast  Result Date: 07/24/2016 CLINICAL DATA:  Initial evaluation for acute altered mental status. History of lung cancer with osseous metastases. EXAM: MRI HEAD WITHOUT AND WITH CONTRAST TECHNIQUE: Multiplanar, multiecho pulse sequences of the brain and surrounding structures were obtained without and with  intravenous contrast. CONTRAST:  49m MULTIHANCE GADOBENATE DIMEGLUMINE 529  MG/ML IV SOLN COMPARISON:  Prior MRI from 05/13/2016. FINDINGS: Brain: Study mildly degraded by motion artifact. Mild diffuse prominence of the CSF containing spaces is compatible with generalized age-related cerebral atrophy. Patchy and confluent T2/FLAIR hyperintensity within the periventricular and deep white matter both cerebral hemispheres, most consistent with chronic microvascular ischemic disease, mildly progressed relative to previous. There is a 9 mm focus of linear restricted diffusion within the subcortical/deep white matter of the right parietal lobe, compatible with a small acute ischemic small vessel infarct (series 4, image 33). No associated mass effect or hemorrhage. No other evidence for acute for subacute ischemia. Gray-white matter differentiation otherwise maintained. No evidence for acute or chronic intracranial hemorrhage. No other evidence for chronic infarction. No mass lesion, midline shift or mass effect. No hydrocephalus. No extra-axial fluid collection. Major dural sinuses are grossly patent. No abnormal enhancement. Pituitary gland and suprasellar region within normal limits. Vascular: Major intracranial vascular flow voids are maintained. Skull and upper cervical spine: Abnormal signal intensity within the left occipital condyle with associated enhancement, likely an osseous metastatic lesion (series 11, image 7). Possible additional osseous metastatic lesion within the C3 vertebral body (series 13, image 14), not well evaluated on this exam. No significant extraosseous tumor. No stenosis within the upper cervical spine. No calvarial metastatic lesions identified. Sinuses/Orbits: Globes and orbital soft tissues demonstrate no acute abnormality. Axial myopia noted. Paranasal sinuses are clear. Small bilateral mastoid effusions, slightly larger on the left. Inner ear structures grossly unremarkable. IMPRESSION:  1. 9 mm acute ischemic small vessel white matter infarct within the subcortical/deep white matter of the right parietal lobe. No associated hemorrhage or mass effect. 2. No other acute intracranial process identified. 3. Abnormal signal intensity within the left occipital condyle, likely an osseous metastasis given provided history. No significant extra osseous extension of tumor. 4. Possible additional osseous metastatic lesion within the C3 vertebral body. Further evaluation with dedicated MRI of the cervical spine suggested for complete evaluation. 5. No other MRI evidence for intracranial metastatic disease. 6. Mild to moderate chronic microvascular ischemic disease, mildly progressed relative to 2017. Electronically Signed   By: Jeannine Boga M.D.   On: 07/24/2016 23:06   Mr Lumbar Spine W/o Contrast  Result Date: 07/03/2016 CLINICAL DATA:  Low back and left hip pain for the last 6 weeks. EXAM: MRI LUMBAR SPINE WITHOUT CONTRAST TECHNIQUE: Multiplanar, multisequence MR imaging of the lumbar spine was performed. No intravenous contrast was administered. COMPARISON:  06/03/2016 radiographs FINDINGS: Segmentation: The lowest lumbar type non-rib-bearing vertebra is labeled as L5. Alignment:  No vertebral subluxation is observed. Vertebrae: Low T1 signal intensity lesions scattered in the lumbar spine suspicious for osseous metastatic disease. Among these is a 2.7 by 2.0 cm lesion along the left posterior inferior endplate of L1 which significantly expands the vertebral body posteriorly into the left neural foramen, and may be associated with local epidural tumor. This is accompanied by a 2.6 by 1.9 cm lesion in the spinous process at this level and other metastatic lesions also in the vertebral body. The similar lesion is are present in the L2 vertebral body, and in the L3 spinous process, in the L5 vertebral body and spinous process, and along the upper S1 margin. Several other smaller scattered lesions  are also present. Neoplastic lesions with low T1 and high T2 signal are present in both iliac bones. Conus medullaris: Extends to the L1 level and appears normal. Paraspinal and other soft tissues: No retroperitoneal adenopathy identified. Parapelvic renal cysts.  Disc levels: T12-L1: Unremarkable. L1-2: The bony vertebral expansion/ tumor along the left posterior vertebral body is questionably extending into the epidural space and causes mild left foraminal stenosis and mild displacement of the left L2 nerves in the lateral extraforaminal space. Expansile lytic lesion of the posterior elements. L2-3: Mild right foraminal stenosis and mild displacement of the left L2 nerve in the lateral extraforaminal space due to left lateral extraforaminal disc protrusion along with facet arthropathy and disc bulge. L3-4: Mild displacement of the right L3 nerve in the lateral extraforaminal space due to right lateral extraforaminal disc protrusion. Mild disc bulge. L4-5:  No impingement.  Mild disc bulge. L5-S1:  Unremarkable. IMPRESSION: 1. There are scattered expansile likely lytic lesions in the lumbar vertebra, sacrum, and iliac bones compatible with either multiple myeloma or extensive osseous metastatic disease. An expansile bony lesion with possible epidural involvement along the left posterior L1 vertebral body extends into the neural foramen and contributes to the left foraminal stenosis at L1- 2. Further workup for malignancy is recommended. 2. Lumbar spondylosis and degenerative disc disease also cause mild impingement at L2- 3 and L3-4. These results will be called to the ordering clinician or representative by the Radiologist Assistant, and communication documented in the PACS or zVision Dashboard. Electronically Signed   By: Van Clines M.D.   On: 07/03/2016 13:52   Mr Cervical Spine W Wo Contrast  Result Date: 07/26/2016 CLINICAL DATA:  Metastatic lung cancer. EXAM: MRI CERVICAL AND THORACIC SPINE WITHOUT  AND WITH CONTRAST TECHNIQUE: Multiplanar and multiecho pulse sequences of the cervical spine, to include the craniocervical junction and cervicothoracic junction, and thoracic spine, were obtained without and with intravenous contrast. CONTRAST:  50m MULTIHANCE GADOBENATE DIMEGLUMINE 529 MG/ML IV SOLN COMPARISON:  Brain MRI 07/24/2016. Lumbar spine MRI 07/03/2016. CT chest, abdomen, and pelvis 07/25/2016. FINDINGS: MRI CERVICAL SPINE FINDINGS Alignment: Cervical spine straightening.  No listhesis. Vertebrae: Numerous T2 hyperintense enhancing lesions involving the vertebral bodies and posterior elements of the cervical spine consistent with known osseous metastatic disease. There is involvement of the left occipital condyle as described on recent brain MRI. There is a pathologic C7 vertebral body compression fracture with severe vertebral body height loss centrally. C7 posterior vertebral body retropulsion and ventral epidural tumor result in mild spinal stenosis with slight ventral cord flattening. Expansile lesion in the posterior right C3 vertebral body with possible tiny amount of noncompressive epidural tumor. Cord: Normal signal. Posterior Fossa, vertebral arteries, paraspinal tissues: Unremarkable. Disc levels: Mild right neural foraminal stenosis at C4-5 due to expansile posterior element osseous metastasis at C5. Moderate left neural foraminal stenosis at C6-7 and mild left foraminal stenosis at C7-T1 due to expansile C7 tumor. MRI THORACIC SPINE FINDINGS Alignment:  Normal. Vertebrae: Numerous T2 hyperintense enhancing lesions throughout the vertebral bodies and posterior elements of the thoracic spine. Partially visualized sternal and rib metastases. Small volume ventral epidural tumor predominantly on the right at T2. Pathologic T6 compression fracture with severe height loss centrally and anteriorly and small volume ventral epidural tumor on the right contributing to mild spinal stenosis without spinal  cord compression. Partially visualized expansile lesion in the posterior left L1 vertebral body with epidural tumor as seen on prior lumbar spine MRI narrowing the left lateral recess and left L1-2 neural foramen. Possible tiny amount of left ventral epidural tumor at T8. Cord: Normal signal. Paraspinal and other soft tissues: Moderate right pleural effusion. Disc levels: Preserved disc space heights. No disc herniation. Spinal and neural foraminal narrowing  due to osseous an epidural tumor as described above. IMPRESSION: 1. Widespread osseous metastases. 2. Pathologic vertebral body fractures at C7 and T6 with retropulsion and small volume epidural tumor resulting in mild spinal stenosis. These results will be called to the ordering clinician or representative by the Radiologist Assistant, and communication documented in the PACS or zVision Dashboard. Electronically Signed   By: Logan Bores M.D.   On: 07/26/2016 20:02   Mr Thoracic Spine W Wo Contrast  Result Date: 07/26/2016 CLINICAL DATA:  Metastatic lung cancer. EXAM: MRI CERVICAL AND THORACIC SPINE WITHOUT AND WITH CONTRAST TECHNIQUE: Multiplanar and multiecho pulse sequences of the cervical spine, to include the craniocervical junction and cervicothoracic junction, and thoracic spine, were obtained without and with intravenous contrast. CONTRAST:  52m MULTIHANCE GADOBENATE DIMEGLUMINE 529 MG/ML IV SOLN COMPARISON:  Brain MRI 07/24/2016. Lumbar spine MRI 07/03/2016. CT chest, abdomen, and pelvis 07/25/2016. FINDINGS: MRI CERVICAL SPINE FINDINGS Alignment: Cervical spine straightening.  No listhesis. Vertebrae: Numerous T2 hyperintense enhancing lesions involving the vertebral bodies and posterior elements of the cervical spine consistent with known osseous metastatic disease. There is involvement of the left occipital condyle as described on recent brain MRI. There is a pathologic C7 vertebral body compression fracture with severe vertebral body height  loss centrally. C7 posterior vertebral body retropulsion and ventral epidural tumor result in mild spinal stenosis with slight ventral cord flattening. Expansile lesion in the posterior right C3 vertebral body with possible tiny amount of noncompressive epidural tumor. Cord: Normal signal. Posterior Fossa, vertebral arteries, paraspinal tissues: Unremarkable. Disc levels: Mild right neural foraminal stenosis at C4-5 due to expansile posterior element osseous metastasis at C5. Moderate left neural foraminal stenosis at C6-7 and mild left foraminal stenosis at C7-T1 due to expansile C7 tumor. MRI THORACIC SPINE FINDINGS Alignment:  Normal. Vertebrae: Numerous T2 hyperintense enhancing lesions throughout the vertebral bodies and posterior elements of the thoracic spine. Partially visualized sternal and rib metastases. Small volume ventral epidural tumor predominantly on the right at T2. Pathologic T6 compression fracture with severe height loss centrally and anteriorly and small volume ventral epidural tumor on the right contributing to mild spinal stenosis without spinal cord compression. Partially visualized expansile lesion in the posterior left L1 vertebral body with epidural tumor as seen on prior lumbar spine MRI narrowing the left lateral recess and left L1-2 neural foramen. Possible tiny amount of left ventral epidural tumor at T8. Cord: Normal signal. Paraspinal and other soft tissues: Moderate right pleural effusion. Disc levels: Preserved disc space heights. No disc herniation. Spinal and neural foraminal narrowing due to osseous an epidural tumor as described above. IMPRESSION: 1. Widespread osseous metastases. 2. Pathologic vertebral body fractures at C7 and T6 with retropulsion and small volume epidural tumor resulting in mild spinal stenosis. These results will be called to the ordering clinician or representative by the Radiologist Assistant, and communication documented in the PACS or zVision  Dashboard. Electronically Signed   By: ALogan BoresM.D.   On: 07/26/2016 20:02   Ct Abdomen Pelvis W Contrast  Result Date: 07/15/2016 CLINICAL DATA:  Low back and left hip pain. Multiple bony lesions most consistent with metastatic disease or myeloma by prior MRI. EXAM: CT CHEST, ABDOMEN, AND PELVIS WITH CONTRAST TECHNIQUE: Multidetector CT imaging of the chest, abdomen and pelvis was performed following the standard protocol during bolus administration of intravenous contrast. CONTRAST:  100 ml ISOVUE-300 IOPAMIDOL (ISOVUE-300) INJECTION 61% COMPARISON:  None. MRI lumbar spine 07/03/2016. FINDINGS: CT CHEST FINDINGS Cardiovascular: Heart size  normal. No pericardial effusion. Calcific aortic atherosclerosis noted. Mediastinum/Nodes: There is mediastinal lymphadenopathy. Node just anterior to the carina eccentric to the right on image 42 measures 1.6 cm by 2.6 cm. Right hilar lymph node on image 48 measures 2.6 by 2.2 cm. Lungs/Pleura: Small right pleural effusion is identified. There is mild thickening and nodularity of the superior aspect of the pleura along right major fissure. No discrete pleural nodule is identified. Solid mass in the right lower lobe on image 80 measures 2.3 x 3.3 cm. Mild dependent atelectasis is seen on the left. Musculoskeletal: Multiple lytic lesions are identified in the thoracic spine. Largest lesion is in T6 where there is a pathologic compression fracture deformity with some bony retropulsion. No obvious stenosis. Lytic lesions are also identified in scattered ribs, see for example the posterior arc of the right fifth rib on image 29. CT ABDOMEN PELVIS FINDINGS Hepatobiliary: No focal liver abnormality is seen. No gallstones, gallbladder wall thickening, or biliary dilatation. Pancreas: Unremarkable. No pancreatic ductal dilatation or surrounding inflammatory changes. Spleen: Normal in size without focal abnormality. Adrenals/Urinary Tract: Parapelvic renal cysts are seen and more  prominent on the left. No solid renal lesion is identified. Ureters and urinary bladder appear normal. The adrenal glands are normal in appearance. Stomach/Bowel: Sigmoid diverticulosis without diverticulitis is noted. The colon is otherwise unremarkable. Stomach, small bowel and appendix appear normal. Vascular/Lymphatic: No significant vascular findings are present. No enlarged abdominal or pelvic lymph nodes. Reproductive: Prostate is unremarkable. Other: No abdominal wall hernia or abnormality. No abdominopelvic ascites. 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 Large lytic lesions are also seen in the posterior right ilium and sacrum. No fracture. 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 Pathologic T6 fracture with bony retropulsion but no obvious central canal stenosis. Evaluation for epidural tumor is limited on CT scan. If the patient has myelopathic symptoms, MRI of the thoracic spine with and without contrast could be used for further evaluation. Calcific aortic and coronary atherosclerosis. Sigmoid diverticulosis without diverticulitis. Electronically Signed   By: Inge Rise M.D.   On: 07/15/2016 10:36      IMPRESSION/PLAN: 1. 71 y.o. 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 I have met with the patient and discussed the findings from his imaging studies. Based on the close proximity of his tumor to the spinal cord, we would recommend palliative radiotherapy. The patient is currently receiving Dexamethasone at 44m TID. We agree with continuing this, and the patient remains neurologically intact. We discussed that we would recommend proceeding more urgently for  biopsy, simulation, and subsequent treatment. He is scheduled for IR eval with biopsy of L1 tomorrow. Hopefully there will be enough tissue to send for molecular studies once the sample is demineralized. The patient is offered the option to proceed sooner to simulation either tomorrow or Thursday, however he wishes to proceed with PET Thursday, and wait to simulate until after this on Friday. I strongly encouraged he and his wife to keep uKoreainformed if after discharge he notes any changes in neurologic function. They understand the concerns for multilevel involvement of disease, and the close proximity of tumor to the cord, and our concern for impending cord compression.   In a visit lasting 726  minutes, greater than 50% of the time was spent face to face discussing the workup thus far, and coordinating the patient's care with internal medicine.     Carola Rhine, PAC Seen on behalf of though discussed with Sheral Apley. Tammi Klippel, MD

## 2016-07-28 NOTE — Procedures (Signed)
CT core 18g x6 bx L1 spinous process mass No complication No blood loss. See complete dictation in [facility].

## 2016-07-28 NOTE — Progress Notes (Signed)
Pt being discharged from hospital per orders from MD. Pt and spouse educated on discharge instructions. Pt and spouse verbalized understanding of instructions. All questions and concerns were addressed. Pt's IV was removed prior to discharge. Pt exited the hospital via wheelchair.

## 2016-07-28 NOTE — Sedation Documentation (Signed)
Patient is resting comfortably. 

## 2016-07-28 NOTE — Consult Note (Signed)
Chief Complaint: bony metastatic disease  Referring Physician:Dr. Phillips Climes  Supervising Physician: Arne Cleveland  Patient Status: [facility] - In-pt  HPI: Warren Odonnell is an 71 y.o. male who was recently diagnosed with a lung mass and multiple bony lesions consistent with probably lung cancer with bony metastatic disease.  He was scheduled to see oncology this week, but due to AMS from decadron and narcotic use for pain, he was admitted to the hospital.  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  This has been evaluated by neurology, but not felt to be the cause of his AMS.  His medications has been adjusted and his mentation is back to baseline.  Oncology has evaluated him here and has recommended a biopsy of a bony lesion on L1 spinous process for diagnosis as well as to be sent for genetic testing.  We have been asked to evaluate the patient for this procedure.  Past Medical History:  Past Medical History:  Diagnosis Date  . Bone metastases ([place]) 07/23/2016  . 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 Glaucoma   . Testicular swelling   . Weight loss 07/24/2016    Past Surgical History:  Past Surgical History:  Procedure Laterality Date  . HERNIA REPAIR  1984  . testicular torsion Left     Family History: History reviewed. No pertinent family history.  Social History:  reports that he has never smoked. He has never used smokeless tobacco. He reports that he does not drink alcohol or use drugs.  Allergies: No Known Allergies  Medications: Medications reviewed in epic  Please HPI for pertinent positives, otherwise complete 10 system ROS negative.  Mallampati Score: MD Evaluation Airway: WNL Heart: WNL Abdomen: WNL Chest/ Lungs: WNL ASA  Classification: 3 Mallampati/Airway Score: Two  Physical Exam: BP (!) 165/77 (BP Location: Right Arm)   Pulse 82   Temp 97.8 F (36.6 C) (Oral)   Resp 18   Ht '5\' 11"'$  (1.803 m)   Wt 168 lb 8 oz (76.4 kg)   SpO2 98%   BMI  23.50 kg/m  Body mass index is 23.5 kg/m. General: pleasant, WD, WN white male who is laying in bed in NAD HEENT: head is normocephalic, atraumatic.  Sclera are noninjected.  PERRL.  Ears and nose without any masses or lesions.  Mouth is pink and moist Heart: regular, rate, and rhythm.  Normal s1,s2. No obvious murmurs, gallops, or rubs noted.  Palpable radial and pedal pulses bilaterally Lungs: CTAB, no wheezes, rhonchi, or rales noted.  Respiratory effort nonlabored Abd: soft, NT, ND, +BS, no masses, hernias, or organomegaly MS: all 4 extremities are symmetrical with no cyanosis, clubbing, or edema. Tremor of upper extremity when trying to sign paper Psych: A&Ox3 with an appropriate affect.   Labs: Results for orders placed or performed during the hospital encounter of 07/24/16 (from the past 48 hour(s))  Protime-INR     Status: None   Collection Time: 07/28/16  2:35 AM  Result Value Ref Range   Prothrombin Time 14.4 11.4 - 15.2 seconds   INR 1.12   APTT     Status: None   Collection Time: 07/28/16  2:35 AM  Result Value Ref Range   aPTT 29 24 - 36 seconds    Imaging: Mr Cervical Spine W Wo Contrast  Result Date: 07/26/2016 CLINICAL DATA:  Metastatic lung cancer. EXAM: MRI CERVICAL AND THORACIC SPINE WITHOUT AND WITH CONTRAST TECHNIQUE: Multiplanar and multiecho pulse sequences of the cervical spine,  to include the craniocervical junction and cervicothoracic junction, and thoracic spine, were obtained without and with intravenous contrast. CONTRAST:  37m MULTIHANCE GADOBENATE DIMEGLUMINE 529 MG/ML IV SOLN COMPARISON:  Brain MRI 07/24/2016. Lumbar spine MRI 07/03/2016. CT chest, abdomen, and pelvis 07/25/2016. FINDINGS: MRI CERVICAL SPINE FINDINGS Alignment: Cervical spine straightening.  No listhesis. Vertebrae: Numerous T2 hyperintense enhancing lesions involving the vertebral bodies and posterior elements of the cervical spine consistent with known osseous metastatic disease. There  is involvement of the left occipital condyle as described on recent brain MRI. There is a pathologic C7 vertebral body compression fracture with severe vertebral body height loss centrally. C7 posterior vertebral body retropulsion and ventral epidural tumor result in mild spinal stenosis with slight ventral cord flattening. Expansile lesion in the posterior right C3 vertebral body with possible tiny amount of noncompressive epidural tumor. Cord: Normal signal. Posterior Fossa, vertebral arteries, paraspinal tissues: Unremarkable. Disc levels: Mild right neural foraminal stenosis at C4-5 due to expansile posterior element osseous metastasis at C5. Moderate left neural foraminal stenosis at C6-7 and mild left foraminal stenosis at C7-T1 due to expansile C7 tumor. MRI THORACIC SPINE FINDINGS Alignment:  Normal. Vertebrae: Numerous T2 hyperintense enhancing lesions throughout the vertebral bodies and posterior elements of the thoracic spine. Partially visualized sternal and rib metastases. Small volume ventral epidural tumor predominantly on the right at T2. Pathologic T6 compression fracture with severe height loss centrally and anteriorly and small volume ventral epidural tumor on the right contributing to mild spinal stenosis without spinal cord compression. Partially visualized expansile lesion in the posterior left L1 vertebral body with epidural tumor as seen on prior lumbar spine MRI narrowing the left lateral recess and left L1-2 neural foramen. Possible tiny amount of left ventral epidural tumor at T8. Cord: Normal signal. Paraspinal and other soft tissues: Moderate right pleural effusion. Disc levels: Preserved disc space heights. No disc herniation. Spinal and neural foraminal narrowing due to osseous an epidural tumor as described above. IMPRESSION: 1. Widespread osseous metastases. 2. Pathologic vertebral body fractures at C7 and T6 with retropulsion and small volume epidural tumor resulting in mild spinal  stenosis. These results will be called to the ordering clinician or representative by the Radiologist Assistant, and communication documented in the PACS or zVision Dashboard. Electronically Signed   By: ALogan BoresM.D.   On: 07/26/2016 20:02   Mr Thoracic Spine W Wo Contrast  Result Date: 07/26/2016 CLINICAL DATA:  Metastatic lung cancer. EXAM: MRI CERVICAL AND THORACIC SPINE WITHOUT AND WITH CONTRAST TECHNIQUE: Multiplanar and multiecho pulse sequences of the cervical spine, to include the craniocervical junction and cervicothoracic junction, and thoracic spine, were obtained without and with intravenous contrast. CONTRAST:  17mMULTIHANCE GADOBENATE DIMEGLUMINE 529 MG/ML IV SOLN COMPARISON:  Brain MRI 07/24/2016. Lumbar spine MRI 07/03/2016. CT chest, abdomen, and pelvis 07/25/2016. FINDINGS: MRI CERVICAL SPINE FINDINGS Alignment: Cervical spine straightening.  No listhesis. Vertebrae: Numerous T2 hyperintense enhancing lesions involving the vertebral bodies and posterior elements of the cervical spine consistent with known osseous metastatic disease. There is involvement of the left occipital condyle as described on recent brain MRI. There is a pathologic C7 vertebral body compression fracture with severe vertebral body height loss centrally. C7 posterior vertebral body retropulsion and ventral epidural tumor result in mild spinal stenosis with slight ventral cord flattening. Expansile lesion in the posterior right C3 vertebral body with possible tiny amount of noncompressive epidural tumor. Cord: Normal signal. Posterior Fossa, vertebral arteries, paraspinal tissues: Unremarkable. Disc levels: Mild right neural  foraminal stenosis at C4-5 due to expansile posterior element osseous metastasis at C5. Moderate left neural foraminal stenosis at C6-7 and mild left foraminal stenosis at C7-T1 due to expansile C7 tumor. MRI THORACIC SPINE FINDINGS Alignment:  Normal. Vertebrae: Numerous T2 hyperintense enhancing  lesions throughout the vertebral bodies and posterior elements of the thoracic spine. Partially visualized sternal and rib metastases. Small volume ventral epidural tumor predominantly on the right at T2. Pathologic T6 compression fracture with severe height loss centrally and anteriorly and small volume ventral epidural tumor on the right contributing to mild spinal stenosis without spinal cord compression. Partially visualized expansile lesion in the posterior left L1 vertebral body with epidural tumor as seen on prior lumbar spine MRI narrowing the left lateral recess and left L1-2 neural foramen. Possible tiny amount of left ventral epidural tumor at T8. Cord: Normal signal. Paraspinal and other soft tissues: Moderate right pleural effusion. Disc levels: Preserved disc space heights. No disc herniation. Spinal and neural foraminal narrowing due to osseous an epidural tumor as described above. IMPRESSION: 1. Widespread osseous metastases. 2. Pathologic vertebral body fractures at C7 and T6 with retropulsion and small volume epidural tumor resulting in mild spinal stenosis. These results will be called to the ordering clinician or representative by the Radiologist Assistant, and communication documented in the PACS or zVision Dashboard. Electronically Signed   By: Logan Bores M.D.   On: 07/26/2016 20:02    Assessment/Plan 1. Lung mass with bony lesions, likely malignant.  L1 spinous process lesion -we will plan to pursue bx today of this lesion -labs and vitals reviewed -plan for patient to be DC after our bx today -Risks and Benefits discussed with the patient including, but not limited to bleeding, infection, damage to adjacent structures or low yield requiring additional tests. All of the patient's questions were answered, patient is agreeable to proceed. Consent signed and in chart.  Thank you for this interesting consult.  I greatly enjoyed meeting AMARI ZAGAL and look forward to participating  in their care.  A copy of this report was sent to the requesting provider on this date.  Electronically Signed: Henreitta Cea 07/28/2016, 8:27 AM   I spent a total of 40 Minutes    in face to face in clinical consultation, greater than 50% of which was counseling/coordinating care for L1 spinous process lesion

## 2016-07-28 NOTE — Sedation Documentation (Signed)
Bandaid  L low back area intact, dry

## 2016-07-29 ENCOUNTER — Ambulatory Visit: Payer: Medicare Other

## 2016-07-29 ENCOUNTER — Inpatient Hospital Stay (HOSPITAL_COMMUNITY): Admission: RE | Admit: 2016-07-29 | Payer: Medicare Other | Source: Ambulatory Visit

## 2016-07-29 ENCOUNTER — Encounter
Admission: RE | Admit: 2016-07-29 | Discharge: 2016-07-29 | Disposition: A | Discharge status: Home / Self Care | Payer: Medicare Other | Source: Ambulatory Visit | Attending: Internal Medicine | Admitting: Internal Medicine

## 2016-07-29 DIAGNOSIS — C7951 Secondary malignant neoplasm of bone: Secondary | ICD-10-CM

## 2016-07-29 DIAGNOSIS — R918 Other nonspecific abnormal finding of lung field: Secondary | ICD-10-CM

## 2016-07-29 LAB — GLUCOSE, CAPILLARY: GLUCOSE-CAPILLARY: 109 mg/dL — AB (ref 65–99)

## 2016-07-29 MED ORDER — FLUDEOXYGLUCOSE F - 18 (FDG) INJECTION
12.5200 | Freq: Once | INTRAVENOUS | Status: AC | PRN
Start: 2016-07-29 — End: 2016-07-29
  Administered 2016-07-29: 12.52 via INTRAVENOUS

## 2016-07-30 ENCOUNTER — Encounter: Payer: Self-pay | Admitting: Urology

## 2016-07-30 ENCOUNTER — Encounter: Payer: Self-pay | Admitting: Internal Medicine

## 2016-07-30 ENCOUNTER — Ambulatory Visit (HOSPITAL_COMMUNITY)
Admission: RE | Admit: 2016-07-30 | Discharge: 2016-07-30 | Disposition: A | Discharge status: Home / Self Care | Payer: Medicare Other | Source: Ambulatory Visit | Attending: Urology | Admitting: Urology

## 2016-07-30 ENCOUNTER — Telehealth: Payer: Self-pay | Admitting: *Deleted

## 2016-07-30 ENCOUNTER — Other Ambulatory Visit: Payer: Self-pay | Admitting: Internal Medicine

## 2016-07-30 ENCOUNTER — Ambulatory Visit
Admission: RE | Admit: 2016-07-30 | Discharge: 2016-07-30 | Disposition: A | Discharge status: Home / Self Care | Payer: Medicare Other | Source: Ambulatory Visit | Attending: Radiation Oncology | Admitting: Radiation Oncology

## 2016-07-30 ENCOUNTER — Telehealth: Payer: Self-pay | Admitting: Internal Medicine

## 2016-07-30 ENCOUNTER — Ambulatory Visit (HOSPITAL_COMMUNITY)
Admission: RE | Admit: 2016-07-30 | Discharge: 2016-07-30 | Disposition: A | Discharge status: Home / Self Care | Payer: Medicare Other | Source: Ambulatory Visit | Attending: Radiology | Admitting: Radiology

## 2016-07-30 ENCOUNTER — Ambulatory Visit (HOSPITAL_COMMUNITY): Payer: Medicare Other

## 2016-07-30 DIAGNOSIS — C3490 Malignant neoplasm of unspecified part of unspecified bronchus or lung: Secondary | ICD-10-CM | POA: Diagnosis not present

## 2016-07-30 DIAGNOSIS — C7951 Secondary malignant neoplasm of bone: Secondary | ICD-10-CM | POA: Diagnosis present

## 2016-07-30 DIAGNOSIS — J90 Pleural effusion, not elsewhere classified: Secondary | ICD-10-CM | POA: Insufficient documentation

## 2016-07-30 DIAGNOSIS — R0602 Shortness of breath: Secondary | ICD-10-CM

## 2016-07-30 DIAGNOSIS — M4802 Spinal stenosis, cervical region: Secondary | ICD-10-CM | POA: Diagnosis not present

## 2016-07-30 DIAGNOSIS — K5730 Diverticulosis of large intestine without perforation or abscess without bleeding: Secondary | ICD-10-CM | POA: Diagnosis not present

## 2016-07-30 DIAGNOSIS — R918 Other nonspecific abnormal finding of lung field: Secondary | ICD-10-CM | POA: Insufficient documentation

## 2016-07-30 DIAGNOSIS — C3491 Malignant neoplasm of unspecified part of right bronchus or lung: Secondary | ICD-10-CM

## 2016-07-30 DIAGNOSIS — C799 Secondary malignant neoplasm of unspecified site: Secondary | ICD-10-CM

## 2016-07-30 HISTORY — DX: Malignant neoplasm of unspecified part of right bronchus or lung: C34.91

## 2016-07-30 NOTE — Progress Notes (Signed)
Delivered three orange top vials (EXPO panel) to [facility] in the [facility] lab today at [phone]. Also, delivered BBRE checklist to Ceasar Lund, [facility].

## 2016-07-30 NOTE — Progress Notes (Signed)
Mr. 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 During his visit, he mentioned that he has continued with a persistent productive cough, SOB and DOE.  He denies fever, chills, or night sweats.  CT imaging was obtained today for planning purposes and demonstrates progressive pleural effusion as compared to prior imaging studies and consistent with his symptomology.  We have therefore scheduled him for a therapeutic thoracentesis to be performed today in Interventional Radiology at [facility] [place].  Patient is escorted to IR by nursing staff.  He will follow up as planned for initialtion of SBRT for palliation of his metastatic lung cancer.  -Nicholos Johns, PA-C

## 2016-07-30 NOTE — Progress Notes (Signed)
Was called by pathologist that patient's biopsy results are consistent with adenocarcinoma, called and informed patient's wife. They are seeing radiation oncology today and Dr. Earlie Server in 2 weeks. Pathologist has already informed Dr. Worthy Flank office.

## 2016-07-30 NOTE — Telephone Encounter (Signed)
lvm to inform pt of lab only appt 2/26 at 345 pm per LOS

## 2016-07-30 NOTE — Telephone Encounter (Signed)
Oncology Nurse Navigator Documentation  Oncology Nurse Navigator Flowsheets 07/30/2016  Navigator Location [facility]  Navigator Encounter Type Telephone/Ms. Wortmann called me back today.  I updated her on blood test that was ordered by Dr. Julien Nordmann.  She asked if scheduling to call her cell phone.  I will send an urgent message for scheduling to call her cell phone.  She was thankful for the call.   Telephone Incoming Call  Treatment Phase Abnormal Scans  Barriers/Navigation Needs Education;Coordination of Care  Education Other  Interventions Coordination of Care;Education  Acuity Level 2  Time Spent with Patient 30

## 2016-07-30 NOTE — Telephone Encounter (Signed)
Oncology Nurse Navigator Documentation  Oncology Nurse Navigator Flowsheets 07/30/2016  Navigator Location [facility]  Navigator Encounter Type Telephone/per Dr. Julien Nordmann, I called Mr. Lupe to update him on blood test ordered.  I was unable to reach and left a vm message for him to call me.   Telephone Outgoing Call  Treatment Phase Abnormal Scans  Barriers/Navigation Needs Coordination of Care  Interventions Coordination of Care  Acuity Level 2  Time Spent with Patient 15

## 2016-07-30 NOTE — Progress Notes (Signed)
  Radiation Oncology         [phone]) [phone] ________________________________  Name: ROBBIN ESCHER MRN: [id number]  Date: 07/30/2016  DOB: 11-Aug-1945  SIMULATION AND TREATMENT PLANNING NOTE    ICD-9-CM ICD-10-CM   1. Bone metastases ([facility]) 198.5 C79.51     DIAGNOSIS:  71 yo man with RLL lung cancer and clinically significant spinal metastases to C7, T2, T6 and L1  NARRATIVE:  The patient was brought to the [facility].  Identity was confirmed.  All relevant records and images related to the planned course of therapy were reviewed.  The patient freely provided informed written consent to proceed with treatment after reviewing the details related to the planned course of therapy. The consent form was witnessed and verified by the simulation staff.  Then, the patient was set-up in a stable reproducible  supine position for radiation therapy.  CT images were obtained.  Surface markings were placed.  The CT images were loaded into the planning software.  Then the target and avoidance structures were contoured.  Treatment planning then occurred.  The radiation prescription was entered and confirmed.  Then, I designed and supervised the construction of a total of 5 medically necessary complex treatment devices with bodyfix and 4 MLCs.  I have requested : 3D plan with DVH of left kidney, right kidney and targets.  I have ordered:Nutrition Consult  PLAN:  The patient will receive 30 Gy in 10 fractions to two areas (C6-T7 AP-PA and T12-L2 AP-PA).  ________________________________  Sheral Apley Tammi Klippel, M.D.

## 2016-07-30 NOTE — Procedures (Signed)
PROCEDURE SUMMARY:  Successful US guided right thoracentesis. Yielded 649m of hazy serosanguinous fluid. Pt tolerated procedure well. No immediate complications.  Specimen was not sent for labs. CXR ordered.  BAscencion DikePA-C 07/30/2016 1:30 PM

## 2016-08-02 ENCOUNTER — Other Ambulatory Visit (HOSPITAL_BASED_OUTPATIENT_CLINIC_OR_DEPARTMENT_OTHER): Payer: Medicare Other

## 2016-08-02 ENCOUNTER — Ambulatory Visit
Admission: RE | Admit: 2016-08-02 | Discharge: 2016-08-02 | Disposition: A | Discharge status: Home / Self Care | Payer: Medicare Other | Source: Ambulatory Visit | Attending: Radiation Oncology | Admitting: Radiation Oncology

## 2016-08-02 ENCOUNTER — Encounter: Payer: Self-pay | Admitting: Radiation Oncology

## 2016-08-02 VITALS — BP 148/79 | HR 89 | Temp 97.6°F | Resp 18 | Ht 71.0 in | Wt 161.8 lb

## 2016-08-02 DIAGNOSIS — C7951 Secondary malignant neoplasm of bone: Secondary | ICD-10-CM

## 2016-08-02 DIAGNOSIS — C3491 Malignant neoplasm of unspecified part of right bronchus or lung: Secondary | ICD-10-CM

## 2016-08-02 MED ORDER — LEVOFLOXACIN 750 MG PO TABS: 750.0000 mg | ORAL_TABLET | Freq: Every day | ORAL | 0 refills | Status: DC

## 2016-08-02 MED ORDER — BENZONATATE 100 MG PO CAPS: 100.0000 mg | ORAL_CAPSULE | Freq: Three times a day (TID) | ORAL | 1 refills | Status: DC | PRN

## 2016-08-02 NOTE — Progress Notes (Signed)
Mr. Warren Odonnell will start radiation to C6-T7 and T12-L2 today.  Appetite has improved over the weekend.  Skin in tact.  Reports having fatigue.  Had a thoracentesis on Friday. 07-30-16. Wt Readings from Last 3 Encounters:  08/02/16 161 lb 12.8 oz (73.4 kg)  07/25/16 168 lb 8 oz (76.4 kg)  07/23/16 171 lb 3.2 oz (77.7 kg)  BP (!) 148/79   Pulse 89   Temp 97.6 F (36.4 C) (Oral)   Resp 18   Ht '5\' 11"'$  (1.803 m)   Wt 161 lb 12.8 oz (73.4 kg)   SpO2 100%   BMI 22.57 kg/m

## 2016-08-03 ENCOUNTER — Ambulatory Visit
Admission: RE | Admit: 2016-08-03 | Discharge: 2016-08-03 | Disposition: A | Discharge status: Home / Self Care | Payer: Medicare Other | Source: Ambulatory Visit | Attending: Radiation Oncology | Admitting: Radiation Oncology

## 2016-08-03 ENCOUNTER — Encounter: Payer: Self-pay | Admitting: *Deleted

## 2016-08-03 DIAGNOSIS — C3491 Malignant neoplasm of unspecified part of right bronchus or lung: Secondary | ICD-10-CM | POA: Diagnosis not present

## 2016-08-03 NOTE — Progress Notes (Signed)
  Radiation Oncology         [phone]   Name: Warren Odonnell MRN: [id number]   Date: 08/02/2016  DOB: January 08, 1946   Weekly Radiation Therapy Management  No diagnosis found.  Current Dose: 0 Gy  Planned Dose:  66 Gy  Narrative Mr. Heaphy is a pleasant 71 y.o. gentleman with stage IV NSCLC, adenocarcinoma of the right lung. The patient is to begin radiation today. He has been having trouble with shortness of breath with exertion and notably more weakness and fatigue in the last week. He reports he's eating at home and reports that he's been able to ambulate at home. He did go for a thoracentesis and had 675 cc removed last Friday. He denies shortness of breath at rest, lower extremity edema. Although he does not recall fevers or chills, he has noted more progressive yellow-green productive mucous, and worsening cough. He denies any hemoptysis.    Physical Findings  height is '5\' 11"'$  (1.803 m) and weight is 161 lb 12.8 oz (73.4 kg). His oral temperature is 97.6 F (36.4 C). His blood pressure is 148/79 (abnormal) and his pulse is 89. His respiration is 18 and oxygen saturation is 100%.   He walked around the clinic with continuous pulse oximeter and his O2 sat never dropped below 96%.  In general this is a tired, chronically ill appearing caucasian male in no acute distress. He is alert and oriented x4 and appropriate throughout the examination.Cardiovascular exam reveals a regular rate and rhythm, no clicks rubs or murmurs are auscultated. Chest is clear to auscultation on the left in all fields, however on th right there are decreased breath sounds at the lung base, with some wheezing. Lower extremities are negative for pretibial pitting edema, deep calf tenderness, cyanosis or clubbing.    Impression Possible postobstructive pneumonia.  Plan The patient's symptoms and exam findings are consistent with a postobstructive picture. We will begin Levaquin daily x 10 days, and have given Tessalon  perles. He does not need oxygen, or appear to have evidence of reaccumulation of fluid, and based on the timing of his assessment, it would be unusual for this fluid to re-accumulate this quickly. We will continue to follow him closely. He will proceed with his first fraction of radiotherapy this afternoon.       Carola Rhine, PAC Seen with Sheral Apley. Tammi Klippel, MD

## 2016-08-03 NOTE — Progress Notes (Signed)
Oncology Nurse Navigator Documentation  Oncology Nurse Navigator Flowsheets 08/03/2016  Navigator Location [facility]  Navigator Encounter Type Other/I followed up on Mr. Salomon pathology to be sent for molecular testing [facility]) and PDL 1.  Tissue was sent out yesterday.  Dr. Julien Nordmann and Shona Simpson PA-C updated.   Treatment Phase Pre-Tx/Tx Discussion  Barriers/Navigation Needs Coordination of Care  Interventions Coordination of Care  Acuity Level 2  Acuity Level 2 Other  Time Spent with Patient 15

## 2016-08-04 ENCOUNTER — Telehealth: Payer: Self-pay | Admitting: *Deleted

## 2016-08-04 ENCOUNTER — Ambulatory Visit
Admission: RE | Admit: 2016-08-04 | Discharge: 2016-08-04 | Disposition: A | Discharge status: Home / Self Care | Payer: Medicare Other | Source: Ambulatory Visit | Attending: Radiation Oncology | Admitting: Radiation Oncology

## 2016-08-04 ENCOUNTER — Other Ambulatory Visit: Payer: Self-pay | Admitting: Medical Oncology

## 2016-08-04 DIAGNOSIS — C3491 Malignant neoplasm of unspecified part of right bronchus or lung: Secondary | ICD-10-CM | POA: Diagnosis not present

## 2016-08-04 DIAGNOSIS — C7951 Secondary malignant neoplasm of bone: Secondary | ICD-10-CM

## 2016-08-04 LAB — EPIDERMAL GROWTH FACTOR RECEPTOR (EGFR) MUTATION ANALYSIS

## 2016-08-04 MED ORDER — MORPHINE SULFATE ER 15 MG PO TBCR: 15.0000 mg | EXTENDED_RELEASE_TABLET | Freq: Two times a day (BID) | ORAL | 0 refills | Status: AC

## 2016-08-04 NOTE — Telephone Encounter (Signed)
I will provide refill of MS Contin and copying Dr. Julien Nordmann to be aware.

## 2016-08-04 NOTE — Telephone Encounter (Signed)
Wife of patient called stating that patient will need a refill for his morphine. Patient morphine will run out on Friday. They will be here at 5 for radiation and would like to pick it up at that time.

## 2016-08-05 ENCOUNTER — Ambulatory Visit
Admission: RE | Admit: 2016-08-05 | Discharge: 2016-08-05 | Disposition: A | Discharge status: Home / Self Care | Payer: Medicare Other | Source: Ambulatory Visit | Attending: Radiation Oncology | Admitting: Radiation Oncology

## 2016-08-05 DIAGNOSIS — C3491 Malignant neoplasm of unspecified part of right bronchus or lung: Secondary | ICD-10-CM | POA: Diagnosis not present

## 2016-08-06 ENCOUNTER — Encounter (HOSPITAL_COMMUNITY): Payer: Self-pay

## 2016-08-06 ENCOUNTER — Ambulatory Visit: Admission: RE | Admit: 2016-08-06 | Payer: Medicare Other | Source: Ambulatory Visit | Admitting: Radiation Oncology

## 2016-08-06 ENCOUNTER — Ambulatory Visit
Admission: RE | Admit: 2016-08-06 | Discharge: 2016-08-06 | Disposition: A | Discharge status: Home / Self Care | Payer: Medicare Other | Source: Ambulatory Visit | Attending: Radiation Oncology | Admitting: Radiation Oncology

## 2016-08-06 DIAGNOSIS — C3491 Malignant neoplasm of unspecified part of right bronchus or lung: Secondary | ICD-10-CM | POA: Diagnosis not present

## 2016-08-07 ENCOUNTER — Emergency Department (HOSPITAL_COMMUNITY): Payer: Medicare Other

## 2016-08-07 ENCOUNTER — Encounter (HOSPITAL_COMMUNITY): Payer: Self-pay

## 2016-08-07 ENCOUNTER — Emergency Department (HOSPITAL_COMMUNITY)
Admission: EM | Admit: 2016-08-07 | Discharge: 2016-08-07 | Disposition: A | Discharge status: Home / Self Care | Payer: Medicare Other | Attending: Emergency Medicine | Admitting: Emergency Medicine

## 2016-08-07 DIAGNOSIS — Y999 Unspecified external cause status: Secondary | ICD-10-CM | POA: Insufficient documentation

## 2016-08-07 DIAGNOSIS — S299XXA Unspecified injury of thorax, initial encounter: Secondary | ICD-10-CM | POA: Diagnosis present

## 2016-08-07 DIAGNOSIS — Y939 Activity, unspecified: Secondary | ICD-10-CM | POA: Diagnosis not present

## 2016-08-07 DIAGNOSIS — W19XXXA Unspecified fall, initial encounter: Secondary | ICD-10-CM | POA: Diagnosis not present

## 2016-08-07 DIAGNOSIS — Y92009 Unspecified place in unspecified non-institutional (private) residence as the place of occurrence of the external cause: Secondary | ICD-10-CM | POA: Insufficient documentation

## 2016-08-07 DIAGNOSIS — Z8673 Personal history of transient ischemic attack (TIA), and cerebral infarction without residual deficits: Secondary | ICD-10-CM | POA: Diagnosis not present

## 2016-08-07 DIAGNOSIS — Z7982 Long term (current) use of aspirin: Secondary | ICD-10-CM | POA: Diagnosis not present

## 2016-08-07 DIAGNOSIS — S2232XA Fracture of one rib, left side, initial encounter for closed fracture: Secondary | ICD-10-CM | POA: Insufficient documentation

## 2016-08-07 LAB — CBC WITH DIFFERENTIAL/PLATELET
BASOS ABS: 0.2 10*3/uL — AB (ref 0.0–0.1)
Basophils Relative: 1 %
EOS ABS: 0 10*3/uL (ref 0.0–0.7)
Eosinophils Relative: 0 %
HEMATOCRIT: 36.4 % — AB (ref 39.0–52.0)
Hemoglobin: 12.6 g/dL — ABNORMAL LOW (ref 13.0–17.0)
LYMPHS ABS: 0.7 10*3/uL (ref 0.7–4.0)
Lymphocytes Relative: 4 %
MCH: 31.3 pg (ref 26.0–34.0)
MCHC: 34.6 g/dL (ref 30.0–36.0)
MCV: 90.3 fL (ref 78.0–100.0)
MONO ABS: 0.5 10*3/uL (ref 0.1–1.0)
Monocytes Relative: 3 %
NEUTROS ABS: 15.6 10*3/uL — AB (ref 1.7–7.7)
Neutrophils Relative %: 92 %
PLATELETS: 355 10*3/uL (ref 150–400)
RBC: 4.03 MIL/uL — AB (ref 4.22–5.81)
RDW: 14.1 % (ref 11.5–15.5)
WBC: 17 10*3/uL — AB (ref 4.0–10.5)

## 2016-08-07 LAB — PROTIME-INR
INR: 1.02
Prothrombin Time: 13.4 seconds (ref 11.4–15.2)

## 2016-08-07 LAB — TYPE AND SCREEN
ABO/RH(D): O POS
Antibody Screen: NEGATIVE

## 2016-08-07 LAB — ABO/RH: ABO/RH(D): O POS

## 2016-08-07 LAB — BASIC METABOLIC PANEL
ANION GAP: 10 (ref 5–15)
BUN: 36 mg/dL — ABNORMAL HIGH (ref 6–20)
CALCIUM: 9.4 mg/dL (ref 8.9–10.3)
CO2: 22 mmol/L (ref 22–32)
CREATININE: 0.99 mg/dL (ref 0.61–1.24)
Chloride: 101 mmol/L (ref 101–111)
Glucose, Bld: 104 mg/dL — ABNORMAL HIGH (ref 65–99)
Potassium: 4.1 mmol/L (ref 3.5–5.1)
SODIUM: 133 mmol/L — AB (ref 135–145)

## 2016-08-07 MED ORDER — HYDROMORPHONE HCL 1 MG/ML IJ SOLN
1.0000 mg | Freq: Once | INTRAMUSCULAR | Status: AC
  Administered 2016-08-07: 1 mg via INTRAVENOUS
  Filled 2016-08-07: qty 1

## 2016-08-07 NOTE — ED Notes (Signed)
Patient ambulated using a walker with stand by assist.

## 2016-08-07 NOTE — ED Provider Notes (Signed)
[facility] DEPT Provider Note   CSN: [id number] Arrival date & time: 08/07/16  1025     History   Chief Complaint Chief Complaint  Patient presents with  . Fall    HPI Warren Odonnell is a 70 y.o. male.  The history is provided by the patient and medical records.  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  Patient reports he was attempting to get out of bed to use the bathroom this morning. States he sat on the edge of the bed, put both feet on the floor but he usually does and repeat forward to lean on his walker, however it slipped out from under him causing him to fall onto his right side. The report briefly brushing the back of his head against the bed, but no loss of consciousness. Wife reports she heard him fall and immediately came into the room. States he was awake, alert, oriented, but was complaining of a lot of pain in his left hip. She gave him his morning pain medication including hydrocodone and morphine, but he has not had any change in symptoms.  States he is on levaquin from PCP for suspected pneumonia/bronchitis.  No changes in breathing.  No chest pain.  Past Medical History:  Diagnosis Date  . Adenocarcinoma of right lung, stage 4 ([place]) 07/30/2016  . Bone metastases ([place]) 07/23/2016  . 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 Glaucoma   . Testicular swelling   . Weight loss 07/24/2016    Patient Active Problem List   Diagnosis Date Noted  . Adenocarcinoma of right lung, stage 4 ([place]) 07/30/2016  . Stroke ([place]) 07/25/2016  . Delirium 07/25/2016  . 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 Weight loss 07/24/2016  . Bone metastases ([place]) 07/23/2016  . Lung mass 07/19/2016    Past Surgical History:  Procedure Laterality Date  . HERNIA REPAIR  1984  . testicular torsion Left        Home Medications     Prior to Admission medications   Medication Sig Start Date End Date Taking? Authorizing Provider  amLODipine (NORVASC) 10 MG tablet Take 1 tablet (10 mg total) by mouth daily. 07/28/16   Belkys A Regalado, MD  aspirin 325 MG tablet Take 1 tablet (325 mg total) by mouth daily. 07/28/16   Belkys A Regalado, MD  atorvastatin (LIPITOR) 20 MG tablet Take 1 tablet (20 mg total) by mouth daily at 6 PM. 07/28/16   Belkys A Regalado, MD  benzonatate (TESSALON) 100 MG capsule Take 1 capsule (100 mg total) by mouth 3 (three) times daily as needed for cough. 08/02/16   Hayden Pedro, PA-C  bimatoprost (LUMIGAN) 0.01 % SOLN Place 1 drop into both eyes at bedtime.    Historical Provider, MD  dexamethasone (DECADRON) 2 MG tablet Take 1 tablet (2 mg total) by mouth every 8 (eight) hours. 07/28/16   Belkys A Regalado, MD  dorzolamide (TRUSOPT) 2 % ophthalmic solution Place 1 drop into both eyes 2 (two) times daily.     Historical Provider, MD  esomeprazole (NEXIUM) 20 MG capsule Take 20 mg by mouth daily.     Historical Provider, MD  fluconazole (DIFLUCAN) 100 MG tablet Take 1 tablet (100 mg total) by mouth daily. 07/23/16   Curt Bears, MD  guaiFENesin (MUCINEX) 600 MG 12 hr tablet Take 2 tablets (  1,200 mg total) by mouth 2 (two) times daily. 07/28/16   Belkys A Regalado, MD  hydrALAZINE (APRESOLINE) 25 MG tablet Take 1 tablet (25 mg total) by mouth every 8 (eight) hours. 07/28/16   Belkys A Regalado, MD  HYDROcodone-acetaminophen (NORCO/VICODIN) 5-325 MG tablet Take 1 tablet by mouth 3 (three) times daily as needed for moderate pain.    Historical Provider, MD  levofloxacin (LEVAQUIN) 750 MG tablet Take 1 tablet (750 mg total) by mouth daily. 08/02/16   Hayden Pedro, PA-C  morphine (MS CONTIN) 15 MG 12 hr tablet Take 1 tablet (15 mg total) by mouth every 12 (twelve) hours. 08/04/16   Tyler Pita, MD  Multiple Vitamin (MULTIVITAMIN WITH MINERALS) TABS tablet Take 1 tablet by mouth daily.     Historical Provider, MD  Multiple Vitamins-Minerals (PRESERVISION AREDS 2+MULTI VIT) CAPS Take 1 capsule by mouth 2 (two) times daily.    Historical Provider, MD  ondansetron (ZOFRAN) 8 MG tablet Take 8 mg by mouth every 8 (eight) hours as needed for nausea or vomiting.    Historical Provider, MD  polyethylene glycol (MIRALAX / GLYCOLAX) packet Take 17 g by mouth 2 (two) times daily. 07/28/16   Belkys A Regalado, MD  prochlorperazine (COMPAZINE) 10 MG tablet Take 1 tablet (10 mg total) by mouth every 6 (six) hours as needed for nausea or vomiting. Patient not taking: Reported on 08/02/2016 07/23/16   Curt Bears, MD  senna-docusate (SENOKOT-S) 8.6-50 MG tablet Take 2 tablets by mouth 2 (two) times daily. 07/28/16   Belkys A Regalado, MD  triamcinolone cream (KENALOG) 0.1 % Apply 1 application topically 2 (two) times daily as needed (for rash).     Historical Provider, MD    Family History No family history on file.  Social History Social History  Substance Use Topics  . Smoking status: Never Smoker  . Smokeless tobacco: Never Used  . Alcohol use No     Allergies   Patient has no known allergies.   Review of Systems Review of Systems  Musculoskeletal: Positive for arthralgias.  All other systems reviewed and are negative.    Physical Exam Updated Vital Signs BP 152/84   Pulse 95   Temp 98.4 F (36.9 C)   Resp 16   Ht '5\' 11"'$  (1.803 m)   Wt 73.5 kg   SpO2 99%   BMI 22.59 kg/m   Physical Exam  Constitutional: He is oriented to person, place, and time. He appears well-developed and well-nourished.  HENT:  Head: Normocephalic and atraumatic.  Mouth/Throat: Oropharynx is clear and moist.  No visible signs of head trauma  Eyes: Conjunctivae and EOM are normal. Pupils are equal, round, and reactive to light.  Neck: Normal range of motion.  Cardiovascular: Normal rate, regular rhythm and normal heart sounds.   Pulmonary/Chest: Effort normal and breath sounds normal. No  respiratory distress. He has no wheezes.  No gross deformity of chest wall, ribs are overall nontender, lungs with some coarse breath sounds, no distress  Abdominal: Soft. Bowel sounds are normal. There is no tenderness. There is no rebound.  Musculoskeletal: Normal range of motion.  Tenderness noted diffusely throughout left hip; full flexion/extension Tenderness of the left shoulder, pain with attempted range of motion  Neurological: He is alert and oriented to person, place, and time.  Awake, alert, oriented x3, moving his extremities well without noted ataxia, somewhat shaky appearing but wife reports this is normal  Skin: Skin is warm and dry.  Psychiatric: He has  a normal mood and affect.  Nursing note and vitals reviewed.    ED Treatments / Results  Labs (all labs ordered are listed, but only abnormal results are displayed) Labs Reviewed  CBC WITH DIFFERENTIAL/PLATELET - Abnormal; Notable for the following:       Result Value   WBC 17.0 (*)    RBC 4.03 (*)    Hemoglobin 12.6 (*)    HCT 36.4 (*)    Neutro Abs 15.6 (*)    Basophils Absolute 0.2 (*)    All other components within normal limits  BASIC METABOLIC PANEL - Abnormal; Notable for the following:    Sodium 133 (*)    Glucose, Bld 104 (*)    BUN 36 (*)    All other components within normal limits  PROTIME-INR  TYPE AND SCREEN  ABO/RH    EKG  EKG Interpretation None       Radiology Dg Chest 1 View  Result Date: 08/07/2016 CLINICAL DATA:  Fall. EXAM: CHEST 1 VIEW COMPARISON:  Radiograph of July 30, 2016. FINDINGS: Stable cardiomediastinal silhouette. No pneumothorax is noted. Left lung is clear. Right basilar atelectasis or infiltrate is noted. Minimal right pleural effusion is noted. Right hilar prominence is noted concerning for adenopathy. Bony thorax is unremarkable. IMPRESSION: Right hilar prominence consistent with adenopathy. Right basilar atelectasis or infiltrate is noted with minimal right pleural  effusion. Electronically Signed   By: Marijo Conception, M.D.   On: 08/07/2016 11:30   Ct Hip Left Wo Contrast  Result Date: 08/07/2016 CLINICAL DATA:  [place] at home this morning. Known osseous metastatic disease. EXAM: CT OF THE LEFT HIP WITHOUT CONTRAST TECHNIQUE: Multidetector CT imaging of the left hip was performed according to the standard protocol. Multiplanar CT image reconstructions were also generated. COMPARISON:  PET-CT 07/15/2016 FINDINGS: As demonstrated on the recent PET-CT there are multiple aggressive appearing lytic bone lesions consistent with metastasis. These most notably involving the left iliac bone and the left pubic bone. The right-sided lesions are not included on this study. No pathologic fracture is identified. No hip or proximal femur lesions are identified. No acute hip fracture. No significant intrapelvic abnormalities. No inguinal mass or adenopathy. IMPRESSION: Stable lytic bone lesions involving the left pelvis but no acute pathologic fracture and no hip fracture Electronically Signed   By: Marijo Sanes M.D.   On: 08/07/2016 12:48   Dg Shoulder Left  Result Date: 08/07/2016 CLINICAL DATA:  Left shoulder pain after fall. EXAM: LEFT SHOULDER - 2+ VIEW COMPARISON:  None. FINDINGS: Mildly displaced left second rib fracture is noted. The acromioclavicular and glenohumeral joints appear intact. No dislocation is noted. IMPRESSION: Mildly displaced left second rib fracture. No other abnormality seen. Electronically Signed   By: Marijo Conception, M.D.   On: 08/07/2016 11:34   Dg Hip Unilat W Or W/o Pelvis 2-3 Views Left  Result Date: 08/07/2016 CLINICAL DATA:  Left hip and shoulder pain after fall. History of lung cancer and bone metastasis. EXAM: DG HIP (WITH OR WITHOUT PELVIS) 2-3V LEFT COMPARISON:  Head CT 07/29/2016 FINDINGS: Patient has known lucent bone lesions in the pelvis. The lucent lesion involving the left superior pubic ramus is most conspicuous. There may be another  lesion along the right iliac wing. No clear evidence for a pathologic fracture. The left hip is located without a fracture. IMPRESSION: No acute bone abnormality to the pelvis or left hip. Lucent lesions throughout the pelvis compatible with metastatic disease. No clear evidence for a  pathologic fracture. If there is high clinical concern for a fracture, recommend further characterization with CT. Electronically Signed   By: Markus Daft M.D.   On: 08/07/2016 11:34    Procedures Procedures (including critical care time)  Medications Ordered in ED Medications  HYDROmorphone (DILAUDID) injection 1 mg (1 mg Intravenous Given 08/07/16 1152)  HYDROmorphone (DILAUDID) injection 1 mg (1 mg Intravenous Given 08/07/16 1310)     Initial Impression / Assessment and Plan / ED Course  I have reviewed the triage vital signs and the nursing notes.  Pertinent labs & imaging results that were available during my care of the patient were reviewed by me and considered in my medical decision making (see chart for details).  71 year old male here after a fall. Appears to be mechanical fall after his walker slipped out from under him when attempting to get out of bed. Grazed the back of his head against the bed but no loss of consciousness. Has been given his onset of medication, but continues having a lot of pain. He does have known metastatic adenocarcinoma of the lung with bony metastases to pelvis, ribs, sternum, and spine. On exam, he is awake, alert, fully oriented. No visible signs of head trauma. Diffuse pain on the left hip but no leg shortening, strength or sensation deficit. Lungs are overall clear.  Also reports some left shoulder pain.  No response to fentanyl with EMS.  Will plan for basic labs, imaging studies.  Dose of dilaudid given.  Labwork with leukocytosis of 17,000. Patient is on daily Decadron which may account for this. Electrolytes are overall normal. Chest x-ray with questionable pneumonia. Patient  is already on Levaquin for this prescribed by his PCP 3 days ago. Shoulder and hip films are negative-- he does have incidental finding of left 2nd rib fracture, however has no pain in this area with palpation and there is no deformity or crepitus. This may be pathologic given known mets to ribs. O2 sats remain stable.   Patient continues having a lot of pain in the left hip. He does have known lytic lesions in the pelvis. CT scan of the hip was obtained which is negative as well. After second dose of pain medication patient appears better, pain is more controlled. He was able to get up and ambulate with a walker which is baseline.  Will discharge home.  Continue home pain medications and levaquin.  FU closely with PCP and oncologist.  Given incentive spirometer given rib fracture.  Discussed plan with patient, he/she acknowledged understanding and agreed with plan of care.  Return precautions given for new or worsening symptoms.  Case discussed with attending physician, Dr. Zenia Resides, who evaluated patient and agrees with assessment and plan of care.  Final Clinical Impressions(s) / ED Diagnoses   Final diagnoses:  Fall  Fall, initial encounter  Closed fracture of one rib of left side, initial encounter    New Prescriptions Discharge Medication List as of 08/07/2016  1:38 PM       Larene Pickett, PA-C 08/07/16 [place], PA-C 08/07/16 [place], MD 08/10/16 [phone]

## 2016-08-07 NOTE — ED Notes (Signed)
Bed: VO45 Expected date:  Expected time:  Means of arrival:  Comments: 71 yo fall- Bone and lung CA

## 2016-08-07 NOTE — ED Provider Notes (Signed)
Medical screening examination/treatment/procedure(s) were conducted as a shared visit with non-physician practitioner(s) and myself.  I personally evaluated the patient during the encounter.   EKG Interpretation None     Patient here after having a mechanical fall just prior to arrival. Does have a rib fracture noted on chest x-ray. Patient had left hip pain with negative plain films as well as CT. Is able to ablate here in the department. Stable for discharge   Lacretia Leigh, MD 08/07/16 1336

## 2016-08-07 NOTE — ED Triage Notes (Addendum)
Pt came by EMS after having a fall at home try to get out of bed to used a bed side commode. Pt c/o  Left posteria hip pain. Pt was recently dx with bone and lung cancer. Pt received morphine 15 mg daily home medication. Pt received fentanyl 100 mcg per EMS. Pt is currently doing radiation 5/10 treatment done. Last radiation was yesterday 08/06/16

## 2016-08-07 NOTE — ED Notes (Signed)
Patient wife states he has incentive spirometry at home and they know how to work with it and they don't need RT.

## 2016-08-07 NOTE — Discharge Instructions (Signed)
As we discussed, you do have a left rib fracture noted but no hip or pelvic fractures. Use the incentive spirometer to a sure you're breathing is baseline. Continue the Levaquin as prescribed by her primary care doctor. Follow-up with your primary care doctor. Also recommend he follow-up closely with your oncologist. Return to the ED for new or worsening symptoms.

## 2016-08-09 ENCOUNTER — Ambulatory Visit
Admission: RE | Admit: 2016-08-09 | Discharge: 2016-08-09 | Disposition: A | Discharge status: Home / Self Care | Payer: Medicare Other | Source: Ambulatory Visit | Attending: Radiation Oncology | Admitting: Radiation Oncology

## 2016-08-09 ENCOUNTER — Other Ambulatory Visit: Payer: Self-pay | Admitting: Medical Oncology

## 2016-08-09 DIAGNOSIS — C3491 Malignant neoplasm of unspecified part of right bronchus or lung: Secondary | ICD-10-CM

## 2016-08-09 DIAGNOSIS — C7951 Secondary malignant neoplasm of bone: Secondary | ICD-10-CM | POA: Diagnosis present

## 2016-08-09 DIAGNOSIS — K5730 Diverticulosis of large intestine without perforation or abscess without bleeding: Secondary | ICD-10-CM | POA: Diagnosis not present

## 2016-08-09 DIAGNOSIS — M4802 Spinal stenosis, cervical region: Secondary | ICD-10-CM | POA: Diagnosis not present

## 2016-08-10 ENCOUNTER — Telehealth: Payer: Self-pay | Admitting: Internal Medicine

## 2016-08-10 ENCOUNTER — Ambulatory Visit
Admission: RE | Admit: 2016-08-10 | Discharge: 2016-08-10 | Disposition: A | Discharge status: Home / Self Care | Payer: Medicare Other | Source: Ambulatory Visit | Attending: Radiation Oncology | Admitting: Radiation Oncology

## 2016-08-10 ENCOUNTER — Other Ambulatory Visit (HOSPITAL_BASED_OUTPATIENT_CLINIC_OR_DEPARTMENT_OTHER): Payer: Medicare Other

## 2016-08-10 ENCOUNTER — Encounter: Payer: Self-pay | Admitting: Internal Medicine

## 2016-08-10 ENCOUNTER — Ambulatory Visit (HOSPITAL_BASED_OUTPATIENT_CLINIC_OR_DEPARTMENT_OTHER): Payer: Medicare Other | Admitting: Internal Medicine

## 2016-08-10 VITALS — BP 117/77 | HR 99 | Temp 98.1°F | Resp 20 | Ht 71.0 in | Wt 157.1 lb

## 2016-08-10 DIAGNOSIS — R634 Abnormal weight loss: Secondary | ICD-10-CM | POA: Diagnosis not present

## 2016-08-10 DIAGNOSIS — C3491 Malignant neoplasm of unspecified part of right bronchus or lung: Secondary | ICD-10-CM

## 2016-08-10 DIAGNOSIS — Z5111 Encounter for antineoplastic chemotherapy: Secondary | ICD-10-CM

## 2016-08-10 DIAGNOSIS — C7951 Secondary malignant neoplasm of bone: Secondary | ICD-10-CM

## 2016-08-10 DIAGNOSIS — G893 Neoplasm related pain (acute) (chronic): Secondary | ICD-10-CM

## 2016-08-10 DIAGNOSIS — I10 Essential (primary) hypertension: Secondary | ICD-10-CM

## 2016-08-10 DIAGNOSIS — Z7189 Other specified counseling: Secondary | ICD-10-CM

## 2016-08-10 HISTORY — DX: Other specified counseling: Z71.89

## 2016-08-10 HISTORY — DX: Encounter for antineoplastic chemotherapy: Z51.11

## 2016-08-10 LAB — COMPREHENSIVE METABOLIC PANEL
ALT: 32 U/L (ref 0–55)
AST: 24 U/L (ref 5–34)
Albumin: 2.7 g/dL — ABNORMAL LOW (ref 3.5–5.0)
Alkaline Phosphatase: 101 U/L (ref 40–150)
Anion Gap: 9 mEq/L (ref 3–11)
BUN: 31.4 mg/dL — AB (ref 7.0–26.0)
CO2: 21 meq/L — AB (ref 22–29)
Calcium: 9.2 mg/dL (ref 8.4–10.4)
Chloride: 102 mEq/L (ref 98–109)
Creatinine: 0.9 mg/dL (ref 0.7–1.3)
EGFR: 87 mL/min/{1.73_m2} — AB (ref >90)
GLUCOSE: 113 mg/dL (ref 70–140)
POTASSIUM: 4.5 meq/L (ref 3.5–5.1)
SODIUM: 133 meq/L — AB (ref 136–145)
Total Bilirubin: 0.84 mg/dL (ref 0.20–1.20)
Total Protein: 6 g/dL — ABNORMAL LOW (ref 6.4–8.3)

## 2016-08-10 LAB — CBC WITH DIFFERENTIAL/PLATELET
BASO%: 0.1 % (ref 0.0–2.0)
BASOS ABS: 0 10*3/uL (ref 0.0–0.1)
EOS%: 0 % (ref 0.0–7.0)
Eosinophils Absolute: 0 10*3/uL (ref 0.0–0.5)
HCT: 35.2 % — ABNORMAL LOW (ref 38.4–49.9)
HGB: 11.7 g/dL — ABNORMAL LOW (ref 13.0–17.1)
LYMPH%: 1.1 % — AB (ref 14.0–49.0)
MCH: 30.7 pg (ref 27.2–33.4)
MCHC: 33.2 g/dL (ref 32.0–36.0)
MCV: 92.4 fL (ref 79.3–98.0)
MONO#: 0.6 10*3/uL (ref 0.1–0.9)
MONO%: 6.1 % (ref 0.0–14.0)
NEUT%: 92.7 % — ABNORMAL HIGH (ref 39.0–75.0)
NEUTROS ABS: 9 10*3/uL — AB (ref 1.5–6.5)
Platelets: 259 10*3/uL (ref 140–400)
RBC: 3.81 10*6/uL — AB (ref 4.20–5.82)
RDW: 14.5 % (ref 11.0–14.6)
WBC: 9.7 10*3/uL (ref 4.0–10.3)
lymph#: 0.1 10*3/uL — ABNORMAL LOW (ref 0.9–3.3)

## 2016-08-10 MED ORDER — CYANOCOBALAMIN 1000 MCG/ML IJ SOLN
1000.0000 ug | Freq: Once | INTRAMUSCULAR | Status: AC
  Administered 2016-08-10: 1000 ug via INTRAMUSCULAR

## 2016-08-10 MED ORDER — CYANOCOBALAMIN 1000 MCG/ML IJ SOLN
INTRAMUSCULAR | Status: AC
  Filled 2016-08-10: qty 1

## 2016-08-10 MED ORDER — DRONABINOL 2.5 MG PO CAPS: 2.5000 mg | ORAL_CAPSULE | Freq: Two times a day (BID) | ORAL | 0 refills | Status: AC

## 2016-08-10 MED ORDER — FOLIC ACID 1 MG PO TABS: 1.0000 mg | ORAL_TABLET | Freq: Every day | ORAL | 4 refills | Status: DC

## 2016-08-10 NOTE — Progress Notes (Signed)
START ON PATHWAY REGIMEN - Non-Small Cell Lung     A cycle is every 21 days:     Carboplatin        Dose Mod: None     Pemetrexed        Dose Mod: None     Bevacizumab        Dose Mod: None  **Always confirm dose/schedule in your pharmacy ordering system**    Patient Characteristics: Stage IV Metastatic, Non Squamous, Initial Chemotherapy/Immunotherapy, PS = 0, 1, PD-L1 Expression Positive 1-49% (TPS) / Negative / Not Tested / Not a Candidate for Immunotherapy AJCC T Category: T2a Current Disease Status: Distant Metastases AJCC N Category: N2 AJCC M Category: M1c AJCC 8 Stage Grouping: IVB Histology: Non Squamous Cell ROS1 Rearrangement Status: Awaiting Test Results T790M Mutation Status: Not Applicable - EGFR Mutation Negative/Unknown Other Mutations/Biomarkers: No Other Actionable Mutations PD-L1 Expression Status: PD-L1 Negative Chemotherapy/Immunotherapy LOT: Initial Chemotherapy/Immunotherapy Molecular Targeted Therapy: Not Appropriate ALK Translocation Status: Awaiting Test Results Would you be surprised if this patient died  in the next year? I would NOT be surprised if this patient died in the next year EGFR Mutation Status: Awaiting Test Results BRAF V600E Mutation Status: Awaiting Test Results Performance Status: PS = 0, 1  Intent of Therapy: Non-Curative / Palliative Intent, Discussed with Patient

## 2016-08-10 NOTE — Telephone Encounter (Signed)
Scheduled appts per 08/10/2016 los. Patient left before schedule was finished to go to Rad onc. Patient is aware to check my chart or call in for confirmation of appts

## 2016-08-10 NOTE — Progress Notes (Signed)
[place] [phone]   Fax:([phone]) [place], [place] [address]  DIAGNOSIS: Stage IV (T2a, N2, M1b) non-small cell lung cancer adenocarcinoma diagnosed in February 2018 in a patient with never smoking history, presented with right lower lobe lung mass in addition to small right pleural effusion, mediastinal lymphadenopathy and multiple metastatic bone disease. PDL 1 expression 0%. Molecular studies are still pending.    PRIOR THERAPY: Palliative radiotherapy to C6-T7 and T12-L2 under the care of Dr. Tammi Klippel  CURRENT THERAPY: Systemic chemotherapy with carboplatin for AUC of 5, Alimta 500 MG/M2 and Avastin 15 MG/KG every 3 weeks. First dose 08/19/2016.  INTERVAL HISTORY: Warren Odonnell 71 y.o. male returns to the clinic today for follow-up visit accompanied by his wife and 2 daughters. The patient has a lot of complaints. He has significant fatigue and weakness. He also lost a lot of weight. He has a recent fall and broke left second rib. He had several studies since his last visit with me including CT-guided needle biopsy of the L1 spinous process mass and the final pathology was consistent with adenocarcinoma. The tissue block was sent for molecular studies by [facility] but the results are still pending. The patient also had a PET scan that showed ill-defined hypermetabolic right lower lobe mass consistent with primary bronchogenic carcinoma in addition to hyper metabolic nodal metastasis to the right hilum and precarinal lymph node station. There was also widespread hypermetabolic skeletal metastases involving the cervical, thoracic and lumbar spines. There is large intensely hypermetabolic lytic lesion within the pelvis, ribs and sternum. He is currently undergoing palliative radiotherapy to the metastatic bone lesions at C6-T7 and T12-L2 under the care of Dr. Tammi Klippel. MRI of the brain  was negative for metastatic disease to the brain. He had blood test performed recently for EGFR mutation that was reported to be negative. The patient is here today for evaluation and discussion of his treatment options.  MEDICAL HISTORY: Past Medical History:  Diagnosis Date  . Adenocarcinoma of right lung, stage 4 ([place]) 07/30/2016  . Bone metastases ([place]) 07/23/2016  . 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 Glaucoma   . Testicular swelling   . Weight loss 07/24/2016    ALLERGIES:  has No Known Allergies.  MEDICATIONS:  Current Outpatient Prescriptions  Medication Sig Dispense Refill  . amLODipine (NORVASC) 10 MG tablet Take 1 tablet (10 mg total) by mouth daily. 30 tablet 0  . aspirin 325 MG tablet Take 1 tablet (325 mg total) by mouth daily. 30 tablet 0  . atorvastatin (LIPITOR) 20 MG tablet Take 1 tablet (20 mg total) by mouth daily at 6 PM. 30 tablet 0  . benzonatate (TESSALON) 100 MG capsule Take 1 capsule (100 mg total) by mouth 3 (three) times daily as needed for cough. 30 capsule 1  . bimatoprost (LUMIGAN) 0.01 % SOLN Place 1 drop into both eyes at bedtime.    Marland Kitchen dexamethasone (DECADRON) 2 MG tablet Take 1 tablet (2 mg total) by mouth every 8 (eight) hours. 60 tablet 0  . dorzolamide (TRUSOPT) 2 % ophthalmic solution Place 1 drop into both eyes 2 (two) times daily.     Marland Kitchen esomeprazole (NEXIUM) 20 MG capsule Take 20 mg by mouth daily.     . fluconazole (DIFLUCAN) 100 MG tablet Take 1 tablet (100 mg total) by mouth daily. 10 tablet 0  . guaiFENesin (MUCINEX) 600 MG  12 hr tablet Take 2 tablets (1,200 mg total) by mouth 2 (two) times daily. 30 tablet 0  . hydrALAZINE (APRESOLINE) 25 MG tablet Take 1 tablet (25 mg total) by mouth every 8 (eight) hours. 90 tablet 0  . HYDROcodone-acetaminophen (NORCO/VICODIN) 5-325 MG tablet Take 1 tablet by mouth 3 (three) times daily as needed for moderate pain.    Marland Kitchen levofloxacin (LEVAQUIN) 750 MG tablet Take 1 tablet (750 mg total) by mouth daily. 10  tablet 0  . morphine (MS CONTIN) 15 MG 12 hr tablet Take 1 tablet (15 mg total) by mouth every 12 (twelve) hours. 60 tablet 0  . Multiple Vitamin (MULTIVITAMIN WITH MINERALS) TABS tablet Take 1 tablet by mouth daily.    . Multiple Vitamins-Minerals (PRESERVISION AREDS 2+MULTI VIT) CAPS Take 1 capsule by mouth 2 (two) times daily.    . ondansetron (ZOFRAN) 8 MG tablet Take 8 mg by mouth every 8 (eight) hours as needed for nausea or vomiting.    . polyethylene glycol (MIRALAX / GLYCOLAX) packet Take 17 g by mouth 2 (two) times daily. 14 each 0  . prochlorperazine (COMPAZINE) 10 MG tablet Take 1 tablet (10 mg total) by mouth every 6 (six) hours as needed for nausea or vomiting. (Patient not taking: Reported on 08/02/2016) 30 tablet 0  . senna-docusate (SENOKOT-S) 8.6-50 MG tablet Take 2 tablets by mouth 2 (two) times daily. 30 tablet 0  . triamcinolone cream (KENALOG) 0.1 % Apply 1 application topically 2 (two) times daily as needed (for rash).      No current facility-administered medications for this visit.     SURGICAL HISTORY:  Past Surgical History:  Procedure Laterality Date  . HERNIA REPAIR  1984  . testicular torsion Left     REVIEW OF SYSTEMS:  Constitutional: positive for anorexia, fatigue and weight loss Eyes: negative Ears, nose, mouth, throat, and face: negative Respiratory: positive for dyspnea on exertion Cardiovascular: negative Gastrointestinal: positive for nausea Genitourinary:negative Integument/breast: negative Hematologic/lymphatic: negative Musculoskeletal:positive for bone pain and muscle weakness Neurological: positive for weakness Behavioral/Psych: negative Endocrine: negative Allergic/Immunologic: negative   PHYSICAL EXAMINATION: General appearance: alert, cooperative, fatigued and no distress Head: Normocephalic, without obvious abnormality, atraumatic Neck: no adenopathy, no JVD, supple, symmetrical, trachea midline and thyroid not enlarged, symmetric, no  tenderness/mass/nodules Lymph nodes: Cervical, supraclavicular, and axillary nodes normal. Resp: clear to auscultation bilaterally Back: symmetric, no curvature. ROM normal. No CVA tenderness. Cardio: regular rate and rhythm, S1, S2 normal, no murmur, click, rub or gallop GI: soft, non-tender; bowel sounds normal; no masses,  no organomegaly Extremities: extremities normal, atraumatic, no cyanosis or edema Neurologic: Alert and oriented X 3, normal strength and tone. Normal symmetric reflexes. Normal coordination and gait  ECOG PERFORMANCE STATUS: 1 - Symptomatic but completely ambulatory  Blood pressure 117/77, pulse 99, temperature 98.1 F (36.7 C), resp. rate 20, height 5' 11" (1.803 m), weight 157 lb 1.6 oz (71.3 kg), SpO2 99 %.  LABORATORY DATA: Lab Results  Component Value Date   WBC 9.7 08/10/2016   HGB 11.7 (L) 08/10/2016   HCT 35.2 (L) 08/10/2016   MCV 92.4 08/10/2016   PLT 259 08/10/2016      Chemistry      Component Value Date/Time   NA 133 (L) 08/07/2016 1103   NA 139 07/23/2016 0854   K 4.1 08/07/2016 1103   K 3.7 07/23/2016 0854   CL 101 08/07/2016 1103   CO2 22 08/07/2016 1103   CO2 22 07/23/2016 0854   BUN 36 (  H) 08/07/2016 1103   BUN 23.0 07/23/2016 0854   CREATININE 0.99 08/07/2016 1103   CREATININE 1.0 07/23/2016 0854      Component Value Date/Time   CALCIUM 9.4 08/07/2016 1103   CALCIUM 10.3 07/23/2016 0854   ALKPHOS 117 07/24/2016 1933   ALKPHOS 121 07/23/2016 0854   AST 34 07/24/2016 1933   AST 32 07/23/2016 0854   ALT 43 07/24/2016 1933   ALT 48 07/23/2016 0854   BILITOT 0.6 07/24/2016 1933   BILITOT 0.65 07/23/2016 0854       RADIOGRAPHIC STUDIES: Dg Chest 1 View  Result Date: 08/07/2016 CLINICAL DATA:  Fall. EXAM: CHEST 1 VIEW COMPARISON:  Radiograph of July 30, 2016. FINDINGS: Stable cardiomediastinal silhouette. No pneumothorax is noted. Left lung is clear. Right basilar atelectasis or infiltrate is noted. Minimal right pleural  effusion is noted. Right hilar prominence is noted concerning for adenopathy. Bony thorax is unremarkable. IMPRESSION: Right hilar prominence consistent with adenopathy. Right basilar atelectasis or infiltrate is noted with minimal right pleural effusion. Electronically Signed   By: Marijo Conception, M.D.   On: 08/07/2016 11:30   Dg Chest 1 View  Result Date: 07/30/2016 CLINICAL DATA:  Status post right thoracentesis. Right pleural effusion. EXAM: CHEST 1 VIEW COMPARISON:  PET scan 07/29/2016. FINDINGS: The heart size is normal. There is a significant reduction in the right pleural effusion following thoracentesis. No significant pneumothorax is present. There is partial re-expansion of the right lobe. A residual right lower lobe opacity persists. The left lung is clear. The visualized soft tissues and bony thorax are unremarkable. IMPRESSION: 1. Significant reduction in right pleural effusion without pneumothorax. 2. Residual right lower lobe opacity compatible with known mass. Electronically Signed   By: San Morelle M.D.   On: 07/30/2016 13:45   Ct Chest W Contrast  Result Date: 07/15/2016 CLINICAL DATA:  Low back and left hip pain. Multiple bony lesions most consistent with metastatic disease or myeloma by prior MRI. EXAM: CT CHEST, ABDOMEN, AND PELVIS WITH CONTRAST TECHNIQUE: Multidetector CT imaging of the chest, abdomen and pelvis was performed following the standard protocol during bolus administration of intravenous contrast. CONTRAST:  100 ml ISOVUE-300 IOPAMIDOL (ISOVUE-300) INJECTION 61% COMPARISON:  None. MRI lumbar spine 07/03/2016. FINDINGS: CT CHEST FINDINGS Cardiovascular: Heart size normal. No pericardial effusion. Calcific aortic atherosclerosis noted. Mediastinum/Nodes: There is mediastinal lymphadenopathy. Node just anterior to the carina eccentric to the right on image 42 measures 1.6 cm by 2.6 cm. Right hilar lymph node on image 48 measures 2.6 by 2.2 cm. Lungs/Pleura: Small  right pleural effusion is identified. There is mild thickening and nodularity of the superior aspect of the pleura along right major fissure. No discrete pleural nodule is identified. Solid mass in the right lower lobe on image 80 measures 2.3 x 3.3 cm. Mild dependent atelectasis is seen on the left. Musculoskeletal: Multiple lytic lesions are identified in the thoracic spine. Largest lesion is in T6 where there is a pathologic compression fracture deformity with some bony retropulsion. No obvious stenosis. Lytic lesions are also identified in scattered ribs, see for example the posterior arc of the right fifth rib on image 29. CT ABDOMEN PELVIS FINDINGS Hepatobiliary: No focal liver abnormality is seen. No gallstones, gallbladder wall thickening, or biliary dilatation. Pancreas: Unremarkable. No pancreatic ductal dilatation or surrounding inflammatory changes. Spleen: Normal in size without focal abnormality. Adrenals/Urinary Tract: Parapelvic renal cysts are seen and more prominent on the left. No solid renal lesion is identified. Ureters and urinary  bladder appear normal. The adrenal glands are normal in appearance. Stomach/Bowel: Sigmoid diverticulosis without diverticulitis is noted. The colon is otherwise unremarkable. Stomach, small bowel and appendix appear normal. Vascular/Lymphatic: No significant vascular findings are present. No enlarged abdominal or pelvic lymph nodes. Reproductive: Prostate is unremarkable. Other: No abdominal wall hernia or abnormality. No abdominopelvic ascites. 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 Large lytic lesions are also seen in the posterior right ilium and sacrum. No fracture. 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  Pathologic T6 fracture with bony retropulsion but no obvious central canal stenosis. Evaluation for epidural tumor is limited on CT scan. If the patient has myelopathic symptoms, MRI of the thoracic spine with and without contrast could be used for further evaluation. Calcific aortic and coronary atherosclerosis. Sigmoid diverticulosis without diverticulitis. Electronically Signed   By: Inge Rise M.D.   On: 07/15/2016 10:36   Ct Guided Needle Placement  Result Date: 07/28/2016 CLINICAL DATA:  Lung mass, multiple osseous lytic lesions. EXAM: CT GUIDED CORE BIOPSY OF  L1 SPINOUS PROCESS  MASS ANESTHESIA/SEDATION: Intravenous Fentanyl and Versed were administered as conscious sedation during continuous monitoring of the patient's level of consciousness and physiological / cardiorespiratory status by the radiology RN, with a total moderate sedation time of 10 minutes. PROCEDURE: The procedure risks, benefits, and alternatives were explained to the patient. Questions regarding the procedure were encouraged and answered. The patient understands and consents to the procedure. Patient placed prone. Select axial scans through the mid abdomen obtained. The lytic L1 spinous process lesion was localized and an appropriate skin entry site determined and marked. The operative field was prepped with chlorhexidinein a sterile fashion, and a sterile drape was applied covering the operative field. A sterile gown and sterile gloves were used for the procedure. Local anesthesia was provided with 1% Lidocaine. Under CT fluoroscopic guidance, a 17 gauge trocar needle was advanced to the margin of the lesion. Once needle tip position was confirmed, coaxial 18-gauge core biopsy samples were obtained, submitted in formalin to surgical pathology. The guide needle was removed. Postprocedure scans show no hemorrhage or other apparent complication. The patient tolerated the procedure well. COMPLICATIONS: None immediate FINDINGS: The  lytic soft tissue attenuation L1 lesion was localized. 6 solid-appearing 2 cm 18 gauge core biopsy samples were obtained under CT guidance as above. IMPRESSION: 1. Technically successful CT-guided core biopsy, left L1 spinous process mass. Electronically Signed   By: Lucrezia Europe M.D.   On: 07/28/2016 10:43   Mr Jeri Cos KL Contrast  Result Date: 07/24/2016 CLINICAL DATA:  Initial evaluation for acute altered mental status. History of lung cancer with osseous metastases. EXAM: MRI HEAD WITHOUT AND WITH CONTRAST TECHNIQUE: Multiplanar, multiecho pulse sequences of the brain and surrounding structures were obtained without and with intravenous contrast. CONTRAST:  61m MULTIHANCE GADOBENATE DIMEGLUMINE 529 MG/ML IV SOLN COMPARISON:  Prior MRI from 05/13/2016. FINDINGS: Brain: Study mildly degraded by motion artifact. Mild diffuse prominence of the CSF containing spaces is compatible with generalized age-related cerebral atrophy. Patchy and confluent T2/FLAIR hyperintensity within the periventricular and deep white matter both cerebral hemispheres, most consistent with chronic microvascular ischemic disease, mildly progressed relative to previous. There is a 9 mm focus of linear restricted diffusion within the subcortical/deep white matter of the right parietal lobe, compatible with  a small acute ischemic small vessel infarct (series 4, image 33). No associated mass effect or hemorrhage. No other evidence for acute for subacute ischemia. Gray-white matter differentiation otherwise maintained. No evidence for acute or chronic intracranial hemorrhage. No other evidence for chronic infarction. No mass lesion, midline shift or mass effect. No hydrocephalus. No extra-axial fluid collection. Major dural sinuses are grossly patent. No abnormal enhancement. Pituitary gland and suprasellar region within normal limits. Vascular: Major intracranial vascular flow voids are maintained. Skull and upper cervical spine: Abnormal  signal intensity within the left occipital condyle with associated enhancement, likely an osseous metastatic lesion (series 11, image 7). Possible additional osseous metastatic lesion within the C3 vertebral body (series 13, image 14), not well evaluated on this exam. No significant extraosseous tumor. No stenosis within the upper cervical spine. No calvarial metastatic lesions identified. Sinuses/Orbits: Globes and orbital soft tissues demonstrate no acute abnormality. Axial myopia noted. Paranasal sinuses are clear. Small bilateral mastoid effusions, slightly larger on the left. Inner ear structures grossly unremarkable. IMPRESSION: 1. 9 mm acute ischemic small vessel white matter infarct within the subcortical/deep white matter of the right parietal lobe. No associated hemorrhage or mass effect. 2. No other acute intracranial process identified. 3. Abnormal signal intensity within the left occipital condyle, likely an osseous metastasis given provided history. No significant extra osseous extension of tumor. 4. Possible additional osseous metastatic lesion within the C3 vertebral body. Further evaluation with dedicated MRI of the cervical spine suggested for complete evaluation. 5. No other MRI evidence for intracranial metastatic disease. 6. Mild to moderate chronic microvascular ischemic disease, mildly progressed relative to 2017. Electronically Signed   By: Jeannine Boga M.D.   On: 07/24/2016 23:06   Mr Cervical Spine W Wo Contrast  Result Date: 07/26/2016 CLINICAL DATA:  Metastatic lung cancer. EXAM: MRI CERVICAL AND THORACIC SPINE WITHOUT AND WITH CONTRAST TECHNIQUE: Multiplanar and multiecho pulse sequences of the cervical spine, to include the craniocervical junction and cervicothoracic junction, and thoracic spine, were obtained without and with intravenous contrast. CONTRAST:  54m MULTIHANCE GADOBENATE DIMEGLUMINE 529 MG/ML IV SOLN COMPARISON:  Brain MRI 07/24/2016. Lumbar spine MRI  07/03/2016. CT chest, abdomen, and pelvis 07/25/2016. FINDINGS: MRI CERVICAL SPINE FINDINGS Alignment: Cervical spine straightening.  No listhesis. Vertebrae: Numerous T2 hyperintense enhancing lesions involving the vertebral bodies and posterior elements of the cervical spine consistent with known osseous metastatic disease. There is involvement of the left occipital condyle as described on recent brain MRI. There is a pathologic C7 vertebral body compression fracture with severe vertebral body height loss centrally. C7 posterior vertebral body retropulsion and ventral epidural tumor result in mild spinal stenosis with slight ventral cord flattening. Expansile lesion in the posterior right C3 vertebral body with possible tiny amount of noncompressive epidural tumor. Cord: Normal signal. Posterior Fossa, vertebral arteries, paraspinal tissues: Unremarkable. Disc levels: Mild right neural foraminal stenosis at C4-5 due to expansile posterior element osseous metastasis at C5. Moderate left neural foraminal stenosis at C6-7 and mild left foraminal stenosis at C7-T1 due to expansile C7 tumor. MRI THORACIC SPINE FINDINGS Alignment:  Normal. Vertebrae: Numerous T2 hyperintense enhancing lesions throughout the vertebral bodies and posterior elements of the thoracic spine. Partially visualized sternal and rib metastases. Small volume ventral epidural tumor predominantly on the right at T2. Pathologic T6 compression fracture with severe height loss centrally and anteriorly and small volume ventral epidural tumor on the right contributing to mild spinal stenosis without spinal cord compression. Partially visualized expansile lesion in the posterior  left L1 vertebral body with epidural tumor as seen on prior lumbar spine MRI narrowing the left lateral recess and left L1-2 neural foramen. Possible tiny amount of left ventral epidural tumor at T8. Cord: Normal signal. Paraspinal and other soft tissues: Moderate right pleural  effusion. Disc levels: Preserved disc space heights. No disc herniation. Spinal and neural foraminal narrowing due to osseous an epidural tumor as described above. IMPRESSION: 1. Widespread osseous metastases. 2. Pathologic vertebral body fractures at C7 and T6 with retropulsion and small volume epidural tumor resulting in mild spinal stenosis. These results will be called to the ordering clinician or representative by the Radiologist Assistant, and communication documented in the PACS or zVision Dashboard. Electronically Signed   By: Logan Bores M.D.   On: 07/26/2016 20:02   Mr Thoracic Spine W Wo Contrast  Result Date: 07/26/2016 CLINICAL DATA:  Metastatic lung cancer. EXAM: MRI CERVICAL AND THORACIC SPINE WITHOUT AND WITH CONTRAST TECHNIQUE: Multiplanar and multiecho pulse sequences of the cervical spine, to include the craniocervical junction and cervicothoracic junction, and thoracic spine, were obtained without and with intravenous contrast. CONTRAST:  71m MULTIHANCE GADOBENATE DIMEGLUMINE 529 MG/ML IV SOLN COMPARISON:  Brain MRI 07/24/2016. Lumbar spine MRI 07/03/2016. CT chest, abdomen, and pelvis 07/25/2016. FINDINGS: MRI CERVICAL SPINE FINDINGS Alignment: Cervical spine straightening.  No listhesis. Vertebrae: Numerous T2 hyperintense enhancing lesions involving the vertebral bodies and posterior elements of the cervical spine consistent with known osseous metastatic disease. There is involvement of the left occipital condyle as described on recent brain MRI. There is a pathologic C7 vertebral body compression fracture with severe vertebral body height loss centrally. C7 posterior vertebral body retropulsion and ventral epidural tumor result in mild spinal stenosis with slight ventral cord flattening. Expansile lesion in the posterior right C3 vertebral body with possible tiny amount of noncompressive epidural tumor. Cord: Normal signal. Posterior Fossa, vertebral arteries, paraspinal tissues:  Unremarkable. Disc levels: Mild right neural foraminal stenosis at C4-5 due to expansile posterior element osseous metastasis at C5. Moderate left neural foraminal stenosis at C6-7 and mild left foraminal stenosis at C7-T1 due to expansile C7 tumor. MRI THORACIC SPINE FINDINGS Alignment:  Normal. Vertebrae: Numerous T2 hyperintense enhancing lesions throughout the vertebral bodies and posterior elements of the thoracic spine. Partially visualized sternal and rib metastases. Small volume ventral epidural tumor predominantly on the right at T2. Pathologic T6 compression fracture with severe height loss centrally and anteriorly and small volume ventral epidural tumor on the right contributing to mild spinal stenosis without spinal cord compression. Partially visualized expansile lesion in the posterior left L1 vertebral body with epidural tumor as seen on prior lumbar spine MRI narrowing the left lateral recess and left L1-2 neural foramen. Possible tiny amount of left ventral epidural tumor at T8. Cord: Normal signal. Paraspinal and other soft tissues: Moderate right pleural effusion. Disc levels: Preserved disc space heights. No disc herniation. Spinal and neural foraminal narrowing due to osseous an epidural tumor as described above. IMPRESSION: 1. Widespread osseous metastases. 2. Pathologic vertebral body fractures at C7 and T6 with retropulsion and small volume epidural tumor resulting in mild spinal stenosis. These results will be called to the ordering clinician or representative by the Radiologist Assistant, and communication documented in the PACS or zVision Dashboard. Electronically Signed   By: ALogan BoresM.D.   On: 07/26/2016 20:02   Ct Abdomen Pelvis W Contrast  Result Date: 07/15/2016 CLINICAL DATA:  Low back and left hip pain. Multiple bony lesions most consistent with  metastatic disease or myeloma by prior MRI. EXAM: CT CHEST, ABDOMEN, AND PELVIS WITH CONTRAST TECHNIQUE: Multidetector CT imaging  of the chest, abdomen and pelvis was performed following the standard protocol during bolus administration of intravenous contrast. CONTRAST:  100 ml ISOVUE-300 IOPAMIDOL (ISOVUE-300) INJECTION 61% COMPARISON:  None. MRI lumbar spine 07/03/2016. FINDINGS: CT CHEST FINDINGS Cardiovascular: Heart size normal. No pericardial effusion. Calcific aortic atherosclerosis noted. Mediastinum/Nodes: There is mediastinal lymphadenopathy. Node just anterior to the carina eccentric to the right on image 42 measures 1.6 cm by 2.6 cm. Right hilar lymph node on image 48 measures 2.6 by 2.2 cm. Lungs/Pleura: Small right pleural effusion is identified. There is mild thickening and nodularity of the superior aspect of the pleura along right major fissure. No discrete pleural nodule is identified. Solid mass in the right lower lobe on image 80 measures 2.3 x 3.3 cm. Mild dependent atelectasis is seen on the left. Musculoskeletal: Multiple lytic lesions are identified in the thoracic spine. Largest lesion is in T6 where there is a pathologic compression fracture deformity with some bony retropulsion. No obvious stenosis. Lytic lesions are also identified in scattered ribs, see for example the posterior arc of the right fifth rib on image 29. CT ABDOMEN PELVIS FINDINGS Hepatobiliary: No focal liver abnormality is seen. No gallstones, gallbladder wall thickening, or biliary dilatation. Pancreas: Unremarkable. No pancreatic ductal dilatation or surrounding inflammatory changes. Spleen: Normal in size without focal abnormality. Adrenals/Urinary Tract: Parapelvic renal cysts are seen and more prominent on the left. No solid renal lesion is identified. Ureters and urinary bladder appear normal. The adrenal glands are normal in appearance. Stomach/Bowel: Sigmoid diverticulosis without diverticulitis is noted. The colon is otherwise unremarkable. Stomach, small bowel and appendix appear normal. Vascular/Lymphatic: No significant vascular  findings are present. No enlarged abdominal or pelvic lymph nodes. Reproductive: Prostate is unremarkable. Other: No abdominal wall hernia or abnormality. No abdominopelvic ascites. 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 Large lytic lesions are also seen in the posterior right ilium and sacrum. No fracture. 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 Pathologic T6 fracture with bony retropulsion but no obvious central canal stenosis. Evaluation for epidural tumor is limited on CT scan. If the patient has myelopathic symptoms, MRI of the thoracic spine with and without contrast could be used for further evaluation. Calcific aortic and coronary atherosclerosis. Sigmoid diverticulosis without diverticulitis. Electronically Signed   By: Inge Rise M.D.   On: 07/15/2016 10:36   Ct Hip Left Wo Contrast  Result Date: 08/07/2016 CLINICAL DATA:  [place] at home this morning. Known osseous metastatic disease. EXAM: CT OF THE LEFT HIP WITHOUT CONTRAST TECHNIQUE: Multidetector CT imaging of the left hip was performed according to the standard protocol. Multiplanar CT image reconstructions were also generated. COMPARISON:  PET-CT 07/15/2016 FINDINGS: As demonstrated on the recent PET-CT there are multiple aggressive appearing lytic bone lesions consistent with metastasis. These most notably involving the left iliac bone and the left pubic bone. The right-sided lesions are not included on this study. No pathologic fracture is identified. No hip or proximal femur lesions are identified. No acute hip fracture. No significant intrapelvic abnormalities. No inguinal mass or adenopathy. IMPRESSION: Stable lytic bone lesions involving the left pelvis but no acute pathologic  fracture and no hip fracture Electronically Signed   By:  Marijo Sanes M.D.   On: 08/07/2016 12:48   Nm Pet Image Initial (pi) Skull Base To Thigh  Result Date: 07/29/2016 CLINICAL DATA:  Initial treatment strategy for lung carcinoma. Brain mass. Lung mass. Bone metastasis. EXAM: NUCLEAR MEDICINE PET SKULL BASE TO THIGH TECHNIQUE: A 12.5 mCi F-18 FDG was injected intravenously. Full-ring PET imaging was performed from the skull base to thigh after the radiotracer. CT data was obtained and used for attenuation correction and anatomic localization. FASTING BLOOD GLUCOSE:  Value: 109 mg/dl COMPARISON:  None CT 07/15/2016 FINDINGS: NECK No hypermetabolic lymph nodes in the neck. CHEST Hypermetabolic RIGHT lower lobe, infrahilar mass with SUV max equal 5.6. Margins of mass are difficult to define on noncontrast CT Hypermetabolic RIGHT hilar nodal masses SUV max equal 5.5. Hypermetabolic precarinal lymph node measures 19 mm short axis with SUV max equal 5.3. No hypermetabolic supraclavicular lymph nodes. Moderate RIGHT pleural effusion. ABDOMEN/PELVIS No abnormal metabolic activity the liver. Adrenal glands are normal. No hypermetabolic abdominopelvic lymphadenopathy. Prostate gland normal. SKELETON Widespread skeletal metastasis involving the axillary skeleton. These lesions are intensely hypermetabolic with associated lytic lesions on comparison CT. Lesions involve the thoracic spine, cervical spine and lumbar spine. No clear involvement central canal. Example lesion at L1 with SUV max equal 6.5. Lytic lesion in the LEFT iliac wing with SUV max equal 20.2. Lytic lesion in the medial LEFT iliac bone adjacent to SI joint with SUV max equal 5.5. Lesions in the ribs and sternum. IMPRESSION: 1. Ill-defined hypermetabolic RIGHT lower lobe mass consistent with primary bronchogenic carcinoma. 2. Hypermetabolic nodal metastasis to the RIGHT hilum and precarinal lymph node station. 3. Widespread hypermetabolic skeletal  metastasis involving the cervical, thoracic and lumbar spine. Large intensely hypermetabolic lytic lesions within the pelvis. Ribs and sternum involved. Electronically Signed   By: Suzy Bouchard M.D.   On: 07/29/2016 17:11   Dg Shoulder Left  Result Date: 08/07/2016 CLINICAL DATA:  Left shoulder pain after fall. EXAM: LEFT SHOULDER - 2+ VIEW COMPARISON:  None. FINDINGS: Mildly displaced left second rib fracture is noted. The acromioclavicular and glenohumeral joints appear intact. No dislocation is noted. IMPRESSION: Mildly displaced left second rib fracture. No other abnormality seen. Electronically Signed   By: Marijo Conception, M.D.   On: 08/07/2016 11:34   Dg Hip Unilat W Or W/o Pelvis 2-3 Views Left  Result Date: 08/07/2016 CLINICAL DATA:  Left hip and shoulder pain after fall. History of lung cancer and bone metastasis. EXAM: DG HIP (WITH OR WITHOUT PELVIS) 2-3V LEFT COMPARISON:  Head CT 07/29/2016 FINDINGS: Patient has known lucent bone lesions in the pelvis. The lucent lesion involving the left superior pubic ramus is most conspicuous. There may be another lesion along the right iliac wing. No clear evidence for a pathologic fracture. The left hip is located without a fracture. IMPRESSION: No acute bone abnormality to the pelvis or left hip. Lucent lesions throughout the pelvis compatible with metastatic disease. No clear evidence for a pathologic fracture. If there is high clinical concern for a fracture, recommend further characterization with CT. Electronically Signed   By: Markus Daft M.D.   On: 08/07/2016 11:34   US Thoracentesis Asp Pleural Space W/img Guide  Result Date: 08/02/2016 INDICATION: Metastatic lung cancer with right-sided pleural effusion. Request therapeutic thoracentesis. EXAM: ULTRASOUND GUIDED RIGHT THORACENTESIS MEDICATIONS: None. COMPLICATIONS: None immediate. PROCEDURE: An ultrasound guided thoracentesis was thoroughly discussed with the patient and questions answered. The  benefits, risks, alternatives and complications were also discussed. The patient  understands and wishes to proceed with the procedure. Written consent was obtained. Ultrasound was performed to localize and mark an adequate pocket of fluid in the right chest. The area was then prepped and draped in the normal sterile fashion. 1% Lidocaine was used for local anesthesia. Under ultrasound guidance a Safe-T-Centesis catheter was introduced. Thoracentesis was performed. The catheter was removed and a dressing applied. FINDINGS: A total of approximately 675 mL of hazy, serosanguineous fluid was removed. IMPRESSION: Successful ultrasound guided right thoracentesis yielding 675 mL of pleural fluid. Read by: Ascencion Dike PA-C Electronically Signed   By: Markus Daft M.D.   On: 08/02/2016 09:19    ASSESSMENT AND PLAN: This is a very pleasant 71 years old white male recently diagnosed with a stage IV (T2a, N2, M1b) non-small cell lung cancer, adenocarcinoma with negative PDL 1 expression but molecular studies are still pending. This was diagnosed in February of 2018. The patient is currently undergoing palliative radiotherapy to the thoracic and lumbar bone metastasis. He has a lot of issues including pain management as well as lack of appetite and weakness. Unfortunately the molecular studies are still pending and the results are expected on 08/18/2016. I had a lengthy discussion with the patient and his family today about his current condition and treatment options. I also discussed with him the goals of care. I gave the patient the option of palliative care and hospice referral versus waiting for the molecular studies before starting treatment versus rescheduling treatment with systemic chemotherapy with carboplatin, Alimta and Avastin to start immediately if the molecular studies are negative. The patient and his family would like to start treatment as soon as possible within a few days from the availability of the  molecular studies. I will arrange for the patient to start systemic chemotherapy with carboplatin for AUC of 5, Alimta 500 MG/M2 and Avastin 15 MG/KG every 3 weeks to start next week if he has no actionable mutations. I discussed with the patient adverse effects of this treatment including but not limited to alopecia, myelosuppression, nausea and vomiting, peripheral neuropathy, liver or renal dysfunction. We will arrange for the patient to receive vitamin B 12 injection today. The patient would also receive prescription for Compazine 10 mg by mouth every 6 hours as needed for nausea, Decadron 4 mg by mouth twice a day, the day before, day of and day after the chemotherapy in addition to folic acid 1 mg by mouth daily. I will arrange for the patient to have a chemotherapy education class before starting the first dose of the chemotherapy. For the weight loss, I will start the patient on Marinol 2.5 mg by mouth twice a day. For the pain management, he will continue with the palliative radiotherapy for now in addition to his current pain medication with MS Contin 15 mg by mouth every 12 hours in addition to Coulter for breakthrough pain. For hypertension, he will continue with his current blood pressure medication and his dose will be adjusted by his primary care physician. I will see the patient back for follow-up visit in 2 weeks for evaluation and management of any adverse effect of his treatment. The patient was advised to call immediately if he has any concerning symptoms in the interval. The patient voices understanding of current disease status and treatment options and is in agreement with the current care plan.  All questions were answered. The patient knows to call the clinic with any problems, questions or concerns. We can certainly see the patient  much sooner if necessary.  I spent 30 minutes counseling the patient face to face. The total time spent in the appointment was 40  minutes.  Disclaimer: This note was dictated with voice recognition software. Similar sounding words can inadvertently be transcribed and may not be corrected upon review.

## 2016-08-11 ENCOUNTER — Ambulatory Visit
Admission: RE | Admit: 2016-08-11 | Discharge: 2016-08-11 | Disposition: A | Discharge status: Home / Self Care | Payer: Medicare Other | Source: Ambulatory Visit | Attending: Radiation Oncology | Admitting: Radiation Oncology

## 2016-08-11 DIAGNOSIS — C3491 Malignant neoplasm of unspecified part of right bronchus or lung: Secondary | ICD-10-CM | POA: Diagnosis not present

## 2016-08-12 ENCOUNTER — Ambulatory Visit: Admission: RE | Admit: 2016-08-12 | Payer: Medicare Other | Source: Ambulatory Visit | Admitting: Radiation Oncology

## 2016-08-12 ENCOUNTER — Encounter: Payer: Self-pay | Admitting: Internal Medicine

## 2016-08-12 ENCOUNTER — Ambulatory Visit
Admission: RE | Admit: 2016-08-12 | Discharge: 2016-08-12 | Disposition: A | Discharge status: Home / Self Care | Payer: Medicare Other | Source: Ambulatory Visit | Attending: Radiation Oncology | Admitting: Radiation Oncology

## 2016-08-12 ENCOUNTER — Telehealth: Payer: Self-pay | Admitting: *Deleted

## 2016-08-12 DIAGNOSIS — C3491 Malignant neoplasm of unspecified part of right bronchus or lung: Secondary | ICD-10-CM | POA: Diagnosis not present

## 2016-08-12 NOTE — Progress Notes (Signed)
Received PA request back from RN. Faxed to [employer]. Fax received ok per confirmation sheet.

## 2016-08-12 NOTE — Telephone Encounter (Signed)
I told dtr that PA form completed with clinical and given back to [facility] for [facility]

## 2016-08-12 NOTE — Telephone Encounter (Signed)
"  Dr. Alen Blew ordered Dronabinol for my husband.  He needs this as soon as possible.  We took the prescription to [facility] at [place] because they have it in stock.  Last night [facility] faxed prior Authorization request to your office so I want to make sure this get's authorized."    Will notify [facility] as no fax has been received to Triage.

## 2016-08-12 NOTE — Progress Notes (Signed)
Received message under patient calls:    "Dr. Alen Blew ordered Dronabinol for my husband.  He needs this as soon as possible.  We took the prescription to [facility] at [place] because they have it in stock.  Last night [facility] faxed prior Authorization request to your office so I want to make sure this get's authorized."    Will notify [facility] as no fax has been received to Triage  Printed PA form from [employer]. Delivered to [place] RN to complete. Once received back will send to [facility] for processing.

## 2016-08-12 NOTE — Telephone Encounter (Signed)
Wife called 2nd time to check if we received the PA form from the pharmacy. Called her back to let her know we did receive the form and are working on it.

## 2016-08-13 ENCOUNTER — Ambulatory Visit
Admission: RE | Admit: 2016-08-13 | Discharge: 2016-08-13 | Disposition: A | Discharge status: Home / Self Care | Payer: Medicare Other | Source: Ambulatory Visit | Attending: Radiation Oncology | Admitting: Radiation Oncology

## 2016-08-13 ENCOUNTER — Encounter: Payer: Self-pay | Admitting: Internal Medicine

## 2016-08-13 ENCOUNTER — Encounter: Payer: Self-pay | Admitting: Radiation Oncology

## 2016-08-13 DIAGNOSIS — C3491 Malignant neoplasm of unspecified part of right bronchus or lung: Secondary | ICD-10-CM | POA: Diagnosis not present

## 2016-08-13 NOTE — Progress Notes (Signed)
Received approval from [employer] for Dronabinol.  Approved through 02/12/17. Called [facility] and spoke with [place] to confirm they were aware of approval.

## 2016-08-16 ENCOUNTER — Other Ambulatory Visit: Payer: Medicare Other

## 2016-08-16 NOTE — Progress Notes (Signed)
  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  Spine C6-T7 // 30 Gy in 10 fractions 2.  Spine T12-L2 // 30 Gy in 10 fractions  Beams/energy:    1.  Isodose Plan // 10 MV  // AP/PA 2.  3D (to avoid kidneys) // 10 MV // AP/PA  Narrative: The patient tolerated radiation treatment relatively well.   His pain improved and spinal cord function remained intact.  Plan: The patient has completed radiation treatment. The patient will return to radiation oncology clinic for routine followup in one month. I advised him to call or return sooner if he has any questions or concerns related to his recovery or treatment. ________________________________  Sheral Apley. Tammi Klippel, M.D.

## 2016-08-18 ENCOUNTER — Other Ambulatory Visit: Payer: Self-pay | Admitting: Radiation Oncology

## 2016-08-18 ENCOUNTER — Telehealth: Payer: Self-pay | Admitting: Radiation Oncology

## 2016-08-18 ENCOUNTER — Encounter (HOSPITAL_COMMUNITY): Payer: Self-pay

## 2016-08-18 ENCOUNTER — Telehealth: Payer: Self-pay | Admitting: *Deleted

## 2016-08-18 DIAGNOSIS — K209 Esophagitis, unspecified without bleeding: Secondary | ICD-10-CM

## 2016-08-18 DIAGNOSIS — C3491 Malignant neoplasm of unspecified part of right bronchus or lung: Secondary | ICD-10-CM

## 2016-08-18 MED ORDER — SUCRALFATE 1 G PO TABS: 1.0000 g | ORAL_TABLET | Freq: Three times a day (TID) | ORAL | 2 refills | Status: DC

## 2016-08-18 NOTE — Telephone Encounter (Signed)
Pt wife called with concerns states " since his results are back does he still need to come for chemotherapy" reviewed with MD instructed wife pt will still need to come for treatment. Wife thanked me for call, no further concerns.

## 2016-08-18 NOTE — Addendum Note (Signed)
Addended by: Tyler Pita on: 08/18/2016 02:52 PM   Modules accepted: Orders

## 2016-08-18 NOTE — Telephone Encounter (Signed)
Patient's wife requested return call. Phoned her back. She reports new onset (since Monday) of pain associated with swallowing. She reports his SOB is unchanged, denies that he has a cough or sinus drainage. She reports he is afebrile. She confirms their pharmacy preference is [facility] [address]. Ms. Bouse understands this RN will phone her back with Dr. Johny Shears order.

## 2016-08-18 NOTE — Telephone Encounter (Signed)
Left message on home and cell phone that [facility] has been escribed to [facility] on [address]. Left contact information for additional questions, needs or concerns.

## 2016-08-18 NOTE — Telephone Encounter (Signed)
Thank you. Sam

## 2016-08-19 ENCOUNTER — Ambulatory Visit (HOSPITAL_BASED_OUTPATIENT_CLINIC_OR_DEPARTMENT_OTHER): Payer: Medicare Other

## 2016-08-19 ENCOUNTER — Other Ambulatory Visit (HOSPITAL_BASED_OUTPATIENT_CLINIC_OR_DEPARTMENT_OTHER): Payer: Medicare Other

## 2016-08-19 VITALS — BP 120/72 | HR 84 | Temp 97.7°F | Resp 18

## 2016-08-19 DIAGNOSIS — C7951 Secondary malignant neoplasm of bone: Secondary | ICD-10-CM

## 2016-08-19 DIAGNOSIS — C3491 Malignant neoplasm of unspecified part of right bronchus or lung: Secondary | ICD-10-CM | POA: Diagnosis not present

## 2016-08-19 DIAGNOSIS — Z79899 Other long term (current) drug therapy: Secondary | ICD-10-CM

## 2016-08-19 DIAGNOSIS — Z5112 Encounter for antineoplastic immunotherapy: Secondary | ICD-10-CM

## 2016-08-19 DIAGNOSIS — Z5111 Encounter for antineoplastic chemotherapy: Secondary | ICD-10-CM

## 2016-08-19 LAB — CBC WITH DIFFERENTIAL/PLATELET
BASO%: 0.2 % (ref 0.0–2.0)
BASOS ABS: 0 10*3/uL (ref 0.0–0.1)
EOS ABS: 0 10*3/uL (ref 0.0–0.5)
EOS%: 0 % (ref 0.0–7.0)
HEMATOCRIT: 33.9 % — AB (ref 38.4–49.9)
HGB: 11.5 g/dL — ABNORMAL LOW (ref 13.0–17.1)
LYMPH%: 1.4 % — ABNORMAL LOW (ref 14.0–49.0)
MCH: 31.4 pg (ref 27.2–33.4)
MCHC: 33.8 g/dL (ref 32.0–36.0)
MCV: 92.7 fL (ref 79.3–98.0)
MONO#: 0.5 10*3/uL (ref 0.1–0.9)
MONO%: 5.8 % (ref 0.0–14.0)
NEUT#: 7.6 10*3/uL — ABNORMAL HIGH (ref 1.5–6.5)
NEUT%: 92.6 % — AB (ref 39.0–75.0)
PLATELETS: 199 10*3/uL (ref 140–400)
RBC: 3.66 10*6/uL — AB (ref 4.20–5.82)
RDW: 15.5 % — ABNORMAL HIGH (ref 11.0–14.6)
WBC: 8.3 10*3/uL (ref 4.0–10.3)
lymph#: 0.1 10*3/uL — ABNORMAL LOW (ref 0.9–3.3)

## 2016-08-19 LAB — COMPREHENSIVE METABOLIC PANEL
ALT: 53 U/L (ref 0–55)
ANION GAP: 10 meq/L (ref 3–11)
AST: 30 U/L (ref 5–34)
Albumin: 2.6 g/dL — ABNORMAL LOW (ref 3.5–5.0)
Alkaline Phosphatase: 104 U/L (ref 40–150)
BUN: 26.6 mg/dL — AB (ref 7.0–26.0)
CALCIUM: 9.3 mg/dL (ref 8.4–10.4)
CHLORIDE: 103 meq/L (ref 98–109)
CO2: 23 mEq/L (ref 22–29)
Creatinine: 0.8 mg/dL (ref 0.7–1.3)
Glucose: 106 mg/dl (ref 70–140)
POTASSIUM: 4.3 meq/L (ref 3.5–5.1)
Sodium: 136 mEq/L (ref 136–145)
Total Bilirubin: 0.99 mg/dL (ref 0.20–1.20)
Total Protein: 5.8 g/dL — ABNORMAL LOW (ref 6.4–8.3)

## 2016-08-19 LAB — UA PROTEIN, DIPSTICK - CHCC

## 2016-08-19 MED ORDER — SODIUM CHLORIDE 0.9 % IV SOLN
Freq: Once | INTRAVENOUS | Status: AC
  Administered 2016-08-19: 15:00:00 via INTRAVENOUS
  Filled 2016-08-19: qty 5

## 2016-08-19 MED ORDER — SODIUM CHLORIDE 0.9 % IV SOLN
Freq: Once | INTRAVENOUS | Status: AC
  Administered 2016-08-19: 15:00:00 via INTRAVENOUS

## 2016-08-19 MED ORDER — SODIUM CHLORIDE 0.9 % IV SOLN
500.0000 mg/m2 | Freq: Once | INTRAVENOUS | Status: AC
  Administered 2016-08-19: 950 mg via INTRAVENOUS
  Filled 2016-08-19: qty 38

## 2016-08-19 MED ORDER — PALONOSETRON HCL INJECTION 0.25 MG/5ML
INTRAVENOUS | Status: AC
  Filled 2016-08-19: qty 5

## 2016-08-19 MED ORDER — BEVACIZUMAB CHEMO INJECTION 400 MG/16ML
15.3000 mg/kg | Freq: Once | INTRAVENOUS | Status: AC
  Administered 2016-08-19: 1100 mg via INTRAVENOUS
  Filled 2016-08-19: qty 32

## 2016-08-19 MED ORDER — PALONOSETRON HCL INJECTION 0.25 MG/5ML
0.2500 mg | Freq: Once | INTRAVENOUS | Status: AC
  Administered 2016-08-19: 0.25 mg via INTRAVENOUS

## 2016-08-19 MED ORDER — SODIUM CHLORIDE 0.9 % IV SOLN
476.5000 mg | Freq: Once | INTRAVENOUS | Status: AC
  Administered 2016-08-19: 480 mg via INTRAVENOUS
  Filled 2016-08-19: qty 48

## 2016-08-19 NOTE — Progress Notes (Signed)
Patient tolerated treatment well. Discharge instructions reviewed with patient and patient's wife, who verbalized understanding. Patient stable upon discharge.

## 2016-08-19 NOTE — Patient Instructions (Signed)
[facility] Discharge Instructions for Patients Receiving Chemotherapy  Today you received the following chemotherapy agents: Bevacizumab (Avastin), Pemetrexed (Alimta), and Carboplatin   To help prevent nausea and vomiting after your treatment, we encourage you to take your nausea medication as directed. DO NOT take zofran for 3 days post treatment. You may resume taking zofran on 08/22/16   If you develop nausea and vomiting that is not controlled by your nausea medication, call the clinic.   BELOW ARE SYMPTOMS THAT SHOULD BE REPORTED IMMEDIATELY:  *FEVER GREATER THAN 100.5 F  *CHILLS WITH OR WITHOUT FEVER  NAUSEA AND VOMITING THAT IS NOT CONTROLLED WITH YOUR NAUSEA MEDICATION  *UNUSUAL SHORTNESS OF BREATH  *UNUSUAL BRUISING OR BLEEDING  TENDERNESS IN MOUTH AND THROAT WITH OR WITHOUT PRESENCE OF ULCERS  *URINARY PROBLEMS  *BOWEL PROBLEMS  UNUSUAL RASH Items with * indicate a potential emergency and should be followed up as soon as possible.  Feel free to call the clinic you have any questions or concerns. The clinic phone number is [phone].  Please show the [facility] at check-in to the Emergency Department and triage nurse.  Bevacizumab injection (Avastin) What is this medicine? BEVACIZUMAB (be va SIZ yoo mab) is a monoclonal antibody. It is used to treat many types of cancer. This medicine may be used for other purposes; ask your health care provider or pharmacist if you have questions. COMMON BRAND NAME(S): Avastin What should I tell my health care provider before I take this medicine? They need to know if you have any of these conditions: -diabetes -heart disease -high blood pressure -history of coughing up blood -prior anthracycline chemotherapy (e.g., doxorubicin, daunorubicin, epirubicin) -recent or ongoing radiation therapy -recent or planning to have surgery -stroke -an unusual or allergic reaction to bevacizumab, hamster  proteins, mouse proteins, other medicines, foods, dyes, or preservatives -pregnant or trying to get pregnant -breast-feeding How should I use this medicine? This medicine is for infusion into a vein. It is given by a health care professional in a hospital or clinic setting. Talk to your pediatrician regarding the use of this medicine in children. Special care may be needed. Overdosage: If you think you have taken too much of this medicine contact a poison control center or emergency room at once. NOTE: This medicine is only for you. Do not share this medicine with others. What if I miss a dose? It is important not to miss your dose. Call your doctor or health care professional if you are unable to keep an appointment. What may interact with this medicine? Interactions are not expected. This list may not describe all possible interactions. Give your health care provider a list of all the medicines, herbs, non-prescription drugs, or dietary supplements you use. Also tell them if you smoke, drink alcohol, or use illegal drugs. Some items may interact with your medicine. What should I watch for while using this medicine? Your condition will be monitored carefully while you are receiving this medicine. You will need important blood work and urine testing done while you are taking this medicine. This medicine may increase your risk to bruise or bleed. Call your doctor or health care professional if you notice any unusual bleeding. This medicine should be started at least 28 days following major surgery and the site of the surgery should be totally healed. Check with your doctor before scheduling dental work or surgery while you are receiving this treatment. Talk to your doctor if you have recently had surgery  or if you have a wound that has not healed. Do not become pregnant while taking this medicine or for 6 months after stopping it. Women should inform their doctor if they wish to become pregnant or  think they might be pregnant. There is a potential for serious side effects to an unborn child. Talk to your health care professional or pharmacist for more information. Do not breast-feed an infant while taking this medicine and for 6 months after the last dose. This medicine has caused ovarian failure in some women. This medicine may interfere with the ability to have a child. You should talk to your doctor or health care professional if you are concerned about your fertility. What side effects may I notice from receiving this medicine? Side effects that you should report to your doctor or health care professional as soon as possible: -allergic reactions like skin rash, itching or hives, swelling of the face, lips, or tongue -chest pain or chest tightness -chills -coughing up blood -high fever -seizures -severe constipation -signs and symptoms of bleeding such as bloody or black, tarry stools; red or dark-brown urine; spitting up blood or brown material that looks like coffee grounds; red spots on the skin; unusual bruising or bleeding from the eye, gums, or nose -signs and symptoms of a blood clot such as breathing problems; chest pain; severe, sudden headache; pain, swelling, warmth in the leg -signs and symptoms of a stroke like changes in vision; confusion; trouble speaking or understanding; severe headaches; sudden numbness or weakness of the face, arm or leg; trouble walking; dizziness; loss of balance or coordination -stomach pain -sweating -swelling of legs or ankles -vomiting -weight gain Side effects that usually do not require medical attention (report to your doctor or health care professional if they continue or are bothersome): -back pain -changes in taste -decreased appetite -dry skin -nausea -tiredness This list may not describe all possible side effects. Call your doctor for medical advice about side effects. You may report side effects to FDA at 1-800-FDA-1088. Where  should I keep my medicine? This drug is given in a hospital or clinic and will not be stored at home. NOTE: This sheet is a summary. It may not cover all possible information. If you have questions about this medicine, talk to your doctor, pharmacist, or health care provider.  2018 Elsevier/Gold Standard (2016-05-21 14:33:29)  Pemetrexed injection (Alimta) What is this medicine? PEMETREXED (PEM e TREX ed) is a chemotherapy drug used to treat lung cancers like non-small cell lung cancer and mesothelioma. It may also be used to treat other cancers. This medicine may be used for other purposes; ask your health care provider or pharmacist if you have questions. COMMON BRAND NAME(S): Alimta What should I tell my health care provider before I take this medicine? They need to know if you have any of these conditions: -infection (especially a virus infection such as chickenpox, cold sores, or herpes) -kidney disease -low blood counts, like low white cell, platelet, or red cell counts -lung or breathing disease, like asthma -radiation therapy -an unusual or allergic reaction to pemetrexed, other medicines, foods, dyes, or preservative -pregnant or trying to get pregnant -breast-feeding How should I use this medicine? This drug is given as an infusion into a vein. It is administered in a hospital or clinic by a specially trained health care professional. Talk to your pediatrician regarding the use of this medicine in children. Special care may be needed. Overdosage: If you think you have taken too  much of this medicine contact a poison control center or emergency room at once. NOTE: This medicine is only for you. Do not share this medicine with others. What if I miss a dose? It is important not to miss your dose. Call your doctor or health care professional if you are unable to keep an appointment. What may interact with this medicine? This medicine may interact with the following  medications: -Ibuprofen This list may not describe all possible interactions. Give your health care provider a list of all the medicines, herbs, non-prescription drugs, or dietary supplements you use. Also tell them if you smoke, drink alcohol, or use illegal drugs. Some items may interact with your medicine. What should I watch for while using this medicine? Visit your doctor for checks on your progress. This drug may make you feel generally unwell. This is not uncommon, as chemotherapy can affect healthy cells as well as cancer cells. Report any side effects. Continue your course of treatment even though you feel ill unless your doctor tells you to stop. In some cases, you may be given additional medicines to help with side effects. Follow all directions for their use. Call your doctor or health care professional for advice if you get a fever, chills or sore throat, or other symptoms of a cold or flu. Do not treat yourself. This drug decreases your body's ability to fight infections. Try to avoid being around people who are sick. This medicine may increase your risk to bruise or bleed. Call your doctor or health care professional if you notice any unusual bleeding. Be careful brushing and flossing your teeth or using a toothpick because you may get an infection or bleed more easily. If you have any dental work done, tell your dentist you are receiving this medicine. Avoid taking products that contain aspirin, acetaminophen, ibuprofen, naproxen, or ketoprofen unless instructed by your doctor. These medicines may hide a fever. Call your doctor or health care professional if you get diarrhea or mouth sores. Do not treat yourself. To protect your kidneys, drink water or other fluids as directed while you are taking this medicine. Do not become pregnant while taking this medicine or for 6 months after stopping it. Women should inform their doctor if they wish to become pregnant or think they might be  pregnant. Men should not father a child while taking this medicine and for 3 months after stopping it. This may interfere with the ability to father a child. You should talk to your doctor or health care professional if you are concerned about your fertility. There is a potential for serious side effects to an unborn child. Talk to your health care professional or pharmacist for more information. Do not breast-feed an infant while taking this medicine or for 1 week after stopping it. What side effects may I notice from receiving this medicine? Side effects that you should report to your doctor or health care professional as soon as possible: -allergic reactions like skin rash, itching or hives, swelling of the face, lips, or tongue -breathing problems -redness, blistering, peeling or loosening of the skin, including inside the mouth -signs and symptoms of bleeding such as bloody or black, tarry stools; red or dark-brown urine; spitting up blood or brown material that looks like coffee grounds; red spots on the skin; unusual bruising or bleeding from the eye, gums, or nose -signs and symptoms of infection like fever or chills; cough; sore throat; pain or trouble passing urine -signs and symptoms of kidney  injury like trouble passing urine or change in the amount of urine -signs and symptoms of liver injury like dark yellow or brown urine; general ill feeling or flu-like symptoms; light-colored stools; loss of appetite; nausea; right upper belly pain; unusually weak or tired; yellowing of the eyes or skin Side effects that usually do not require medical attention (report to your doctor or health care professional if they continue or are bothersome): -constipation -dizziness -mouth sores -nausea, vomiting -pain, tingling, numbness in the hands or feet -unusually weak or tired This list may not describe all possible side effects. Call your doctor for medical advice about side effects. You may report  side effects to FDA at 1-800-FDA-1088. Where should I keep my medicine? This drug is given in a hospital or clinic and will not be stored at home. NOTE: This sheet is a summary. It may not cover all possible information. If you have questions about this medicine, talk to your doctor, pharmacist, or health care provider.  2018 Elsevier/Gold Standard (2016-03-23 18:51:46)  Carboplatin injection What is this medicine? CARBOPLATIN (KAR boe pla tin) is a chemotherapy drug. It targets fast dividing cells, like cancer cells, and causes these cells to die. This medicine is used to treat ovarian cancer and many other cancers. This medicine may be used for other purposes; ask your health care provider or pharmacist if you have questions. COMMON BRAND NAME(S): Paraplatin What should I tell my health care provider before I take this medicine? They need to know if you have any of these conditions: -blood disorders -hearing problems -kidney disease -recent or ongoing radiation therapy -an unusual or allergic reaction to carboplatin, cisplatin, other chemotherapy, other medicines, foods, dyes, or preservatives -pregnant or trying to get pregnant -breast-feeding How should I use this medicine? This drug is usually given as an infusion into a vein. It is administered in a hospital or clinic by a specially trained health care professional. Talk to your pediatrician regarding the use of this medicine in children. Special care may be needed. Overdosage: If you think you have taken too much of this medicine contact a poison control center or emergency room at once. NOTE: This medicine is only for you. Do not share this medicine with others. What if I miss a dose? It is important not to miss a dose. Call your doctor or health care professional if you are unable to keep an appointment. What may interact with this medicine? -medicines for seizures -medicines to increase blood counts like filgrastim,  pegfilgrastim, sargramostim -some antibiotics like amikacin, gentamicin, neomycin, streptomycin, tobramycin -vaccines Talk to your doctor or health care professional before taking any of these medicines: -acetaminophen -aspirin -ibuprofen -ketoprofen -naproxen This list may not describe all possible interactions. Give your health care provider a list of all the medicines, herbs, non-prescription drugs, or dietary supplements you use. Also tell them if you smoke, drink alcohol, or use illegal drugs. Some items may interact with your medicine. What should I watch for while using this medicine? Your condition will be monitored carefully while you are receiving this medicine. You will need important blood work done while you are taking this medicine. This drug may make you feel generally unwell. This is not uncommon, as chemotherapy can affect healthy cells as well as cancer cells. Report any side effects. Continue your course of treatment even though you feel ill unless your doctor tells you to stop. In some cases, you may be given additional medicines to help with side effects. Follow all  directions for their use. Call your doctor or health care professional for advice if you get a fever, chills or sore throat, or other symptoms of a cold or flu. Do not treat yourself. This drug decreases your body's ability to fight infections. Try to avoid being around people who are sick. This medicine may increase your risk to bruise or bleed. Call your doctor or health care professional if you notice any unusual bleeding. Be careful brushing and flossing your teeth or using a toothpick because you may get an infection or bleed more easily. If you have any dental work done, tell your dentist you are receiving this medicine. Avoid taking products that contain aspirin, acetaminophen, ibuprofen, naproxen, or ketoprofen unless instructed by your doctor. These medicines may hide a fever. Do not become pregnant while  taking this medicine. Women should inform their doctor if they wish to become pregnant or think they might be pregnant. There is a potential for serious side effects to an unborn child. Talk to your health care professional or pharmacist for more information. Do not breast-feed an infant while taking this medicine. What side effects may I notice from receiving this medicine? Side effects that you should report to your doctor or health care professional as soon as possible: -allergic reactions like skin rash, itching or hives, swelling of the face, lips, or tongue -signs of infection - fever or chills, cough, sore throat, pain or difficulty passing urine -signs of decreased platelets or bleeding - bruising, pinpoint red spots on the skin, black, tarry stools, nosebleeds -signs of decreased red blood cells - unusually weak or tired, fainting spells, lightheadedness -breathing problems -changes in hearing -changes in vision -chest pain -high blood pressure -low blood counts - This drug may decrease the number of white blood cells, red blood cells and platelets. You may be at increased risk for infections and bleeding. -nausea and vomiting -pain, swelling, redness or irritation at the injection site -pain, tingling, numbness in the hands or feet -problems with balance, talking, walking -trouble passing urine or change in the amount of urine Side effects that usually do not require medical attention (report to your doctor or health care professional if they continue or are bothersome): -hair loss -loss of appetite -metallic taste in the mouth or changes in taste This list may not describe all possible side effects. Call your doctor for medical advice about side effects. You may report side effects to FDA at 1-800-FDA-1088. Where should I keep my medicine? This drug is given in a hospital or clinic and will not be stored at home. NOTE: This sheet is a summary. It may not cover all possible  information. If you have questions about this medicine, talk to your doctor, pharmacist, or health care provider.  2018 Elsevier/Gold Standard (2007-08-29 14:38:05)

## 2016-08-23 ENCOUNTER — Other Ambulatory Visit: Payer: Self-pay | Admitting: Radiation Oncology

## 2016-08-23 ENCOUNTER — Telehealth: Payer: Self-pay | Admitting: Radiation Oncology

## 2016-08-23 DIAGNOSIS — C7951 Secondary malignant neoplasm of bone: Secondary | ICD-10-CM

## 2016-08-23 DIAGNOSIS — K208 Other esophagitis without bleeding: Secondary | ICD-10-CM

## 2016-08-23 DIAGNOSIS — G893 Neoplasm related pain (acute) (chronic): Secondary | ICD-10-CM

## 2016-08-23 MED ORDER — HYDROCODONE-ACETAMINOPHEN 7.5-325 MG/15ML PO SOLN: 5.0000 mL | Freq: Four times a day (QID) | ORAL | 0 refills | Status: DC | PRN

## 2016-08-23 NOTE — Telephone Encounter (Signed)
Phoned patient's wife, Mariann Laster. Explained Hycet script is ready for pick up in the radiation oncology nursing area. She verbalized understanding.

## 2016-08-24 ENCOUNTER — Ambulatory Visit (HOSPITAL_BASED_OUTPATIENT_CLINIC_OR_DEPARTMENT_OTHER): Payer: Medicare Other

## 2016-08-24 ENCOUNTER — Other Ambulatory Visit: Payer: Self-pay | Admitting: Internal Medicine

## 2016-08-24 ENCOUNTER — Ambulatory Visit (HOSPITAL_BASED_OUTPATIENT_CLINIC_OR_DEPARTMENT_OTHER): Payer: Medicare Other | Admitting: Nurse Practitioner

## 2016-08-24 ENCOUNTER — Other Ambulatory Visit: Payer: Self-pay | Admitting: Nurse Practitioner

## 2016-08-24 ENCOUNTER — Telehealth: Payer: Self-pay | Admitting: *Deleted

## 2016-08-24 ENCOUNTER — Encounter: Payer: Self-pay | Admitting: Nurse Practitioner

## 2016-08-24 VITALS — BP 143/65 | HR 77 | Temp 97.8°F | Resp 18 | Ht 71.0 in | Wt 150.2 lb

## 2016-08-24 DIAGNOSIS — K209 Esophagitis, unspecified without bleeding: Secondary | ICD-10-CM | POA: Insufficient documentation

## 2016-08-24 DIAGNOSIS — E860 Dehydration: Secondary | ICD-10-CM | POA: Diagnosis not present

## 2016-08-24 DIAGNOSIS — C3491 Malignant neoplasm of unspecified part of right bronchus or lung: Secondary | ICD-10-CM

## 2016-08-24 DIAGNOSIS — T451X5A Adverse effect of antineoplastic and immunosuppressive drugs, initial encounter: Secondary | ICD-10-CM

## 2016-08-24 DIAGNOSIS — D701 Agranulocytosis secondary to cancer chemotherapy: Secondary | ICD-10-CM | POA: Diagnosis not present

## 2016-08-24 DIAGNOSIS — Z7189 Other specified counseling: Secondary | ICD-10-CM

## 2016-08-24 DIAGNOSIS — G893 Neoplasm related pain (acute) (chronic): Secondary | ICD-10-CM

## 2016-08-24 DIAGNOSIS — C7951 Secondary malignant neoplasm of bone: Secondary | ICD-10-CM | POA: Diagnosis not present

## 2016-08-24 DIAGNOSIS — Z5111 Encounter for antineoplastic chemotherapy: Secondary | ICD-10-CM

## 2016-08-24 LAB — TECHNOLOGIST REVIEW

## 2016-08-24 LAB — COMPREHENSIVE METABOLIC PANEL
ALK PHOS: 122 U/L (ref 40–150)
ALT: 52 U/L (ref 0–55)
ANION GAP: 11 meq/L (ref 3–11)
AST: 35 U/L — ABNORMAL HIGH (ref 5–34)
Albumin: 2.5 g/dL — ABNORMAL LOW (ref 3.5–5.0)
BILIRUBIN TOTAL: 0.77 mg/dL (ref 0.20–1.20)
BUN: 32 mg/dL — ABNORMAL HIGH (ref 7.0–26.0)
CO2: 25 meq/L (ref 22–29)
CREATININE: 0.9 mg/dL (ref 0.7–1.3)
Calcium: 9.8 mg/dL (ref 8.4–10.4)
Chloride: 104 mEq/L (ref 98–109)
EGFR: 89 mL/min/{1.73_m2} — ABNORMAL LOW (ref >90)
GLUCOSE: 105 mg/dL (ref 70–140)
Potassium: 4.9 mEq/L (ref 3.5–5.1)
SODIUM: 140 meq/L (ref 136–145)
TOTAL PROTEIN: 6 g/dL — AB (ref 6.4–8.3)

## 2016-08-24 LAB — CBC WITH DIFFERENTIAL/PLATELET
BASO%: 0.1 % (ref 0.0–2.0)
Basophils Absolute: 0 10*3/uL (ref 0.0–0.1)
EOS ABS: 0 10*3/uL (ref 0.0–0.5)
EOS%: 0.7 % (ref 0.0–7.0)
HCT: 34.3 % — ABNORMAL LOW (ref 38.4–49.9)
HEMOGLOBIN: 11.6 g/dL — AB (ref 13.0–17.1)
LYMPH%: 8.8 % — AB (ref 14.0–49.0)
MCH: 31.4 pg (ref 27.2–33.4)
MCHC: 33.8 g/dL (ref 32.0–36.0)
MCV: 92.9 fL (ref 79.3–98.0)
MONO#: 0 10*3/uL — AB (ref 0.1–0.9)
MONO%: 3.4 % (ref 0.0–14.0)
NEUT%: 87 % — ABNORMAL HIGH (ref 39.0–75.0)
NEUTROS ABS: 0.4 10*3/uL — AB (ref 1.5–6.5)
PLATELETS: 171 10*3/uL (ref 140–400)
RBC: 3.69 10*6/uL — AB (ref 4.20–5.82)
RDW: 15.7 % — AB (ref 11.0–14.6)
WBC: 0.5 10*3/uL — AB (ref 4.0–10.3)
lymph#: 0 10*3/uL — ABNORMAL LOW (ref 0.9–3.3)

## 2016-08-24 MED ORDER — SODIUM CHLORIDE 0.9 % IV SOLN
1000.0000 mL | Freq: Once | INTRAVENOUS | Status: AC
  Administered 2016-08-24: 1000 mL via INTRAVENOUS

## 2016-08-24 MED ORDER — LIDOCAINE VISCOUS 2 % MT SOLN: 15.0000 mL | Freq: Four times a day (QID) | OROMUCOSAL | 0 refills | Status: DC | PRN

## 2016-08-24 MED ORDER — DEXAMETHASONE 4 MG PO TABS: 4.0000 mg | ORAL_TABLET | Freq: Two times a day (BID) | ORAL | 1 refills | Status: DC

## 2016-08-24 NOTE — Assessment & Plan Note (Addendum)
Patient received his first cycle of chemotherapy on 08/19/2016.  WBC is 0.5 and ANC 0.4 today.  Briefly reviewed all neutropenia guidelines with both patient and his family today.  Today-patient was afebrile and vital signs were essentially stable.  Also, patient continues to take Levaquin as previously directed.  Patient appears weak and fatigued; he does not appear toxic today.  We'll continue to monitor closely.

## 2016-08-24 NOTE — Progress Notes (Signed)
Pt to return tomorrow, [phone] @ 11am for additional IV fluids. He will see Dr. Julien Nordmann om Thursday with possible IV fluids after this MD  Visit.  Pt and family aware.  High priority LOS sent for these visits.

## 2016-08-24 NOTE — Patient Instructions (Signed)
Dysphagia Dysphagia is trouble swallowing. This condition occurs when solids and liquids stick in a person's throat on the way down to the stomach, or when food takes longer to get to the stomach. You may have problems swallowing food, liquids, or both. You may also have pain while trying to swallow. It may take you more time and effort to swallow something. What are the causes? This condition is caused by:  Problems with the muscles. They may make it difficult for you to move food and liquids through the tube that connects your mouth to your stomach (esophagus). You may have ulcers, scar tissue, or inflammation that blocks the normal passage of food and liquids. Causes of these problems include:  Acid reflux from your stomach into your esophagus (gastroesophageal reflux).  Infections.  Radiation treatment for cancer.  Medicines taken without enough fluids to wash them down into your stomach.  Nerve problems. These prevent signals from being sent to the muscles of your esophagus to squeeze (contract) and move what you swallow down to your stomach.  Globus pharyngeus. This is a common problem that involves feeling like something is stuck in the throat or a sense of trouble with swallowing even though nothing is wrong with the swallowing passages.  Stroke. This can affect the nerves and make it difficult to swallow.  Certain conditions, such as cerebral palsy or Parkinson disease. What are the signs or symptoms? Common symptoms of this condition include:  A feeling that solids or liquids are stuck in your throat on the way down to the stomach.  Food taking too long to get to the stomach. Other symptoms include:  Food moving back from your stomach to your mouth (regurgitation).  Noises coming from your throat.  Chest discomfort with swallowing.  A feeling of fullness when swallowing.  Drooling, especially when the throat is blocked.  Pain while  swallowing.  Heartburn.  Coughing or gagging while trying to swallow. How is this diagnosed? This condition is diagnosed by:  Barium X-ray. In this test, you swallow a white substance (contrast medium)that sticks to the inside of your esophagus. X-ray images are then taken.  Endoscopy. In this test, a flexible telescope is inserted down your throat to look at your esophagus and your stomach.  CT scans and MRI. How is this treated? Treatment for dysphagia depends on the cause of the condition:  If the dysphagia is caused by acid reflux or infection, medicines may be used. They may include antibiotics and heartburn medicines.  If the dysphagia is caused by problems with your muscles, swallowing therapy may be used to help you strengthen your swallowing muscles. You may have to do specific exercises to strengthen the muscles or stretch them.  If the dysphagia is caused by a blockage or mass, procedures to remove the blockage may be done. You may need surgery and a feeding tube. You may need to make diet changes. Ask your health care provider for specific instructions. Follow these instructions at home: Eating and drinking   Try to eat soft food that is easier to swallow.  Follow any diet changes as told by your health care provider.  Cut your food into small pieces and eat slowly.  Eat and drink only when you are sitting upright.  Do not drink alcohol or caffeine. If you need help quitting, ask your health care provider. General instructions   Check your weight every day to make sure you are not losing weight.  Take over-the-counter and prescription medicines  only as told by your health care provider.  If you were prescribed an antibiotic medicine, take it as told by your health care provider. Do not stop taking the antibiotic even if you start to feel better.  Do not use any products that contain nicotine or tobacco, such as cigarettes and e-cigarettes. If you need help  quitting, ask your health care provider.  Keep all follow-up visits as told by your health care provider. This is important. Contact a health care provider if:  You lose weight because you cannot swallow.  You cough when you drink liquids (aspiration).  You cough up partially digested food. Get help right away if:  You cannot swallow your saliva.  You have shortness of breath or a fever, or both.  You have a hoarse voice and also have trouble swallowing. Summary  Dysphagia is trouble swallowing. This condition occurs when solids and liquids stick in a person's throat on the way down to the stomach, or when food takes longer to get to the stomach.  Dysphagia has many possible causes and symptoms.  Treatment for dysphagia depends on the cause of the condition. This information is not intended to replace advice given to you by your health care provider. Make sure you discuss any questions you have with your health care provider. Document Released: 05/21/2000 Document Revised: 05/13/2016 Document Reviewed: 05/13/2016 Elsevier Interactive Patient Education  2017 [employer] Dehydration, Adult Dehydration is a condition in which there is not enough fluid or water in the body. This happens when you lose more fluids than you take in. Important organs, such as the kidneys, brain, and heart, cannot function without a proper amount of fluids. Any loss of fluids from the body can lead to dehydration. Dehydration can range from mild to severe. This condition should be treated right away to prevent it from becoming severe. What are the causes? This condition may be caused by:  Vomiting.  Diarrhea.  Excessive sweating, such as from heat exposure or exercise.  Not drinking enough fluid, especially:  When ill.  While doing activity that requires a lot of energy.  Excessive urination.  Fever.  Infection.  Certain medicines, such as medicines that cause the body to lose excess  fluid (diuretics).  Inability to access safe drinking water.  Reduced physical ability to get adequate water and food. What increases the risk? This condition is more likely to develop in people:  Who have a poorly controlled long-term (chronic) illness, such as diabetes, heart disease, or kidney disease.  Who are age 89 or older.  Who are disabled.  Who live in a place with high altitude.  Who play endurance sports. What are the signs or symptoms? Symptoms of mild dehydration may include:   Thirst.  Dry lips.  Slightly dry mouth.  Dry, warm skin.  Dizziness. Symptoms of moderate dehydration may include:   Very dry mouth.  Muscle cramps.  Dark urine. Urine may be the color of tea.  Decreased urine production.  Decreased tear production.  Heartbeat that is irregular or faster than normal (palpitations).  Headache.  Light-headedness, especially when you stand up from a sitting position.  Fainting (syncope). Symptoms of severe dehydration may include:   Changes in skin, such as:  Cold and clammy skin.  Blotchy (mottled) or pale skin.  Skin that does not quickly return to normal after being lightly pinched and released (poor skin turgor).  Changes in body fluids, such as:  Extreme thirst.  No tear production.  Inability to sweat when body temperature is high, such as in hot weather.  Very little urine production.  Changes in vital signs, such as:  Weak pulse.  Pulse that is more than 100 beats a minute when sitting still.  Rapid breathing.  Low blood pressure.  Other changes, such as:  Sunken eyes.  Cold hands and feet.  Confusion.  Lack of energy (lethargy).  Difficulty waking up from sleep.  Short-term weight loss.  Unconsciousness. How is this diagnosed? This condition is diagnosed based on your symptoms and a physical exam. Blood and urine tests may be done to help confirm the diagnosis. How is this treated? Treatment for  this condition depends on the severity. Mild or moderate dehydration can often be treated at home. Treatment should be started right away. Do not wait until dehydration becomes severe. Severe dehydration is an emergency and it needs to be treated in a hospital. Treatment for mild dehydration may include:   Drinking more fluids.  Replacing salts and minerals in your blood (electrolytes) that you may have lost. Treatment for moderate dehydration may include:   Drinking an oral rehydration solution (ORS). This is a drink that helps you replace fluids and electrolytes (rehydrate). It can be found at pharmacies and retail stores. Treatment for severe dehydration may include:   Receiving fluids through an IV tube.  Receiving an electrolyte solution through a feeding tube that is passed through your nose and into your stomach (nasogastric tube, or NG tube).  Correcting any abnormalities in electrolytes.  Treating the underlying cause of dehydration. Follow these instructions at home:  If directed by your health care provider, drink an ORS:  Make an ORS by following instructions on the package.  Start by drinking small amounts, about  cup (120 mL) every 5-10 minutes.  Slowly increase how much you drink until you have taken the amount recommended by your health care provider.  Drink enough clear fluid to keep your urine clear or pale yellow. If you were told to drink an ORS, finish the ORS first, then start slowly drinking other clear fluids. Drink fluids such as:  Water. Do not drink only water. Doing that can lead to having too little salt (sodium) in the body (hyponatremia).  Ice chips.  Fruit juice that you have added water to (diluted fruit juice).  Low-calorie sports drinks.  Avoid:  Alcohol.  Drinks that contain a lot of sugar. These include high-calorie sports drinks, fruit juice that is not diluted, and soda.  Caffeine.  Foods that are greasy or contain a lot of fat or  sugar.  Take over-the-counter and prescription medicines only as told by your health care provider.  Do not take sodium tablets. This can lead to having too much sodium in the body (hypernatremia).  Eat foods that contain a healthy balance of electrolytes, such as bananas, oranges, potatoes, tomatoes, and spinach.  Keep all follow-up visits as told by your health care provider. This is important. Contact a health care provider if:  You have abdominal pain that:  Gets worse.  Stays in one area (localizes).  You have a rash.  You have a stiff neck.  You are more irritable than usual.  You are sleepier or more difficult to wake up than usual.  You feel weak or dizzy.  You feel very thirsty.  You have urinated only a small amount of very dark urine over 6-8 hours. Get help right away if:  You have symptoms of severe dehydration.  You cannot drink fluids without vomiting.  Your symptoms get worse with treatment.  You have a fever.  You have a severe headache.  You have vomiting or diarrhea that:  Gets worse.  Does not go away.  You have blood or green matter (bile) in your vomit.  You have blood in your stool. This may cause stool to look black and tarry.  You have not urinated in 6-8 hours.  You faint.  Your heart rate while sitting still is over 100 beats a minute.  You have trouble breathing. This information is not intended to replace advice given to you by your health care provider. Make sure you discuss any questions you have with your health care provider. Document Released: 05/24/2005 Document Revised: 12/19/2015 Document Reviewed: 07/18/2015 Elsevier Interactive Patient Education  2017 [employer].

## 2016-08-24 NOTE — Assessment & Plan Note (Signed)
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  He presented to the [facility] today complaining of significant sore throat and inability to swallow anything orally.  He states that he gets choked drinking even a sip of water.  He feels very dehydrated today.  He is complaining of fatigue and generalized weakness.  He also states he occasionally feels confused as well.  Patient states that he met with his primary care provider this past Friday, 08/20/2016; and was given a Levaquin antibiotic for treatment of any possible infection.  He continues take the Levaquin as directed.  On exam today.  Patient has a trace of fresh 2.  The tongue; but no other obvious oral lesions or erythema of the mouth.  History oropharynx appears clear as well.  Patient appears to be managing all oral secretions with no difficulty; but was noted to have a choking episode when he tried to take a sip of water.  Most likely, this is radiation-induced esophagitis; will hopefully clear soon.  In the meantime-patient was advised to take in thickened liquids only; and to return on a daily basis for IV fluid rehydration.  Also, will prescribe viscous lidocaine for the patient to try as well.  Confirmed the patient is a 31 picked up carafate as well.  Patient was asked to call/return or go directly to the emergency department for any worsening symptoms whatsoever.

## 2016-08-24 NOTE — Assessment & Plan Note (Signed)
Patient received cycle one of his carboplatin/Alimta/Avastin.  Chemotherapy therapy regimen on 08/19/2016.  He will return tomorrow to receive additional IV fluid rehydration.  He will return on 08/26/2016 for labs, visit, and additional IV fluid rehydration.  Also, will return on 09/02/2016 for labs.  Will add a follow-up visit with Dr. Julien Nordmann on 09/02/2016 as well.

## 2016-08-24 NOTE — Telephone Encounter (Signed)
Wife called and left message.  Called her back and spoke with her.  She states her husband is not able to eat and drink.  She talked with Dr. Julien Nordmann last night and he said that patient would need to be seen if he was still unable to eat and drink today.  Home Health nurse has been there and thinks he may be some dehydrated.  Wife thinks he may take in 16-24oz in 24hrs, but is not able to eat anything.  Discussed with Dr. Julien Nordmann.  Have patient come in and see Selena Lesser NP.  Cyndee aware.  Called wife back and asked her to have him here ASAP given that it is already 1:15pm.  Patient will have lab, see Cyndee and then possibly IVF.

## 2016-08-24 NOTE — Assessment & Plan Note (Signed)
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  Patient appears dehydrated.  Will receive IV fluid rehydration today and will return tomorrow for additional IV fluid rehydration.

## 2016-08-24 NOTE — Progress Notes (Signed)
SYMPTOM MANAGEMENT CLINIC    Chief Complaint: Esophagitis, dehydration  HPI:  Warren Odonnell 71 y.o. male diagnosed with lung cancer; with bone metastasis.  Currently undergoing carboplatin/Alimta/Avastin chemotherapy regimen.    No history exists.    Review of Systems  Constitutional: Positive for malaise/fatigue and weight loss.  HENT: Positive for sore throat.   Neurological: Positive for weakness.  All other systems reviewed and are negative.   Past Medical History:  Diagnosis Date  . Adenocarcinoma of right lung, stage 4 ([place]) 07/30/2016  . Bone metastases ([place]) 07/23/2016  . 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 Encounter for antineoplastic chemotherapy 08/10/2016  . Glaucoma   . Goals of care, counseling/discussion 08/10/2016  . Testicular swelling   . Weight loss 07/24/2016    Past Surgical History:  Procedure Laterality Date  . HERNIA REPAIR  1984  . testicular torsion Left     has Bone metastases ([place]); Cancer associated pain; Weight loss; Stroke ([place]); Delirium; Lacunar stroke ([place]); Adenocarcinoma of right lung, stage 4 ([place]); Goals of care, counseling/discussion; Encounter for antineoplastic chemotherapy; Hypertension; Esophagitis; Chemotherapy induced neutropenia ([place]); and Dehydration on his problem list.    has No Known Allergies.  Allergies as of 08/24/2016   No Known Allergies     Medication List       Accurate as of 08/24/16  5:28 PM. Always use your most recent med list.          amLODipine 10 MG tablet Commonly known as:  NORVASC Take 1 tablet (10 mg total) by mouth daily.   aspirin 325 MG tablet Take 1 tablet (325 mg total) by mouth daily.   atorvastatin 20 MG tablet Commonly known as:  LIPITOR Take 1 tablet (20 mg total) by mouth daily at 6 PM.   bimatoprost 0.01 % Soln Commonly known as:  LUMIGAN Place 1 drop into both eyes at bedtime.   dexamethasone 4 MG tablet Commonly known as:  DECADRON   dorzolamide 2 % ophthalmic  solution Commonly known as:  TRUSOPT Place 1 drop into both eyes 2 (two) times daily.   dronabinol 2.5 MG capsule Commonly known as:  MARINOL Take 1 capsule (2.5 mg total) by mouth 2 (two) times daily before lunch and supper.   esomeprazole 20 MG capsule Commonly known as:  NEXIUM Take 20 mg by mouth daily.   folic acid 1 MG tablet Commonly known as:  FOLVITE Take 1 tablet (1 mg total) by mouth daily.   hydrALAZINE 25 MG tablet Commonly known as:  APRESOLINE Take 1 tablet (25 mg total) by mouth every 8 (eight) hours.   HYDROcodone-acetaminophen 5-325 MG tablet Commonly known as:  NORCO/VICODIN Take 1 tablet by mouth 3 (three) times daily as needed for moderate pain.   HYDROcodone-acetaminophen 7.5-325 mg/15 ml solution Commonly known as:  HYCET Take 5-15 mLs by mouth 4 (four) times daily as needed for moderate pain (5 cc 20 min before meals).   levofloxacin 500 MG tablet Commonly known as:  LEVAQUIN Take 500 mg by mouth daily.   morphine 15 MG 12 hr tablet Commonly known as:  MS CONTIN Take 1 tablet (15 mg total) by mouth every 12 (twelve) hours.   multivitamin with minerals Tabs tablet Take 1 tablet by mouth daily.   ondansetron 8 MG tablet Commonly known as:  ZOFRAN Take 8 mg by mouth every 8 (eight) hours as needed for nausea or vomiting.   polyethylene glycol packet Commonly known as:  MIRALAX / GLYCOLAX Take 17  g by mouth 2 (two) times daily.   PRESERVISION AREDS 2+MULTI VIT Caps Take 1 capsule by mouth 2 (two) times daily.   senna-docusate 8.6-50 MG tablet Commonly known as:  Senokot-S Take 2 tablets by mouth 2 (two) times daily.   sucralfate 1 g tablet Commonly known as:  CARAFATE TAKE 1 TABLET BY MOUTH FOUR TIMES DAILY WITH MEALS( 5 MINUTES BEFORE MEALS FOR RADIATION INDLUCED ESOPHAGITIS) AND AT BEDTIME   triamcinolone cream 0.1 % Commonly known as:  KENALOG Apply 1 application topically 2 (two) times daily as needed (for rash).        PHYSICAL  EXAMINATION  Oncology Vitals 08/24/2016 08/19/2016  Height 180 cm -  Weight 68.13 kg -  Weight (lbs) 150 lbs 3 oz -  BMI (kg/m2) 20.95 kg/m2 -  Temp 97.8 -  Pulse 93 84  Resp 17 -  SpO2 97 93  BSA (m2) 1.85 m2 -   BP Readings from Last 2 Encounters:  08/24/16 139/75  08/19/16 120/72    Physical Exam  Constitutional: He is oriented to person, place, and time. He appears malnourished and dehydrated. He appears unhealthy. He appears cachectic.  HENT:  Head: Normocephalic and atraumatic.  Mouth/Throat: Oropharynx is clear and moist.  Eyes: Conjunctivae and EOM are normal. Pupils are equal, round, and reactive to light. Right eye exhibits no discharge. Left eye exhibits no discharge. No scleral icterus.  Neck: Normal range of motion. Neck supple. No JVD present. No tracheal deviation present. No thyromegaly present.  Cardiovascular: Normal rate, regular rhythm, normal heart sounds and intact distal pulses.   Pulmonary/Chest: Effort normal and breath sounds normal. No respiratory distress. He has no wheezes. He has no rales. He exhibits no tenderness.  Abdominal: Soft. Bowel sounds are normal. He exhibits no distension and no mass. There is no tenderness. There is no rebound and no guarding.  Musculoskeletal: Normal range of motion. He exhibits no edema, tenderness or deformity.  Lymphadenopathy:    He has no cervical adenopathy.  Neurological: He is alert and oriented to person, place, and time. Gait normal.  Skin: Skin is warm and dry. No rash noted. No erythema. There is pallor.  Psychiatric: Affect normal.  Nursing note and vitals reviewed.   LABORATORY DATA:. Appointment on 08/24/2016  Component Date Value Ref Range Status  . WBC 08/24/2016 0.5* 4.0 - 10.3 10e3/uL Final  . NEUT# 08/24/2016 0.4* 1.5 - 6.5 10e3/uL Final  . HGB 08/24/2016 11.6* 13.0 - 17.1 g/dL Final  . HCT 08/24/2016 34.3* 38.4 - 49.9 % Final  . Platelets 08/24/2016 171  140 - 400 10e3/uL Final  . MCV  08/24/2016 92.9  79.3 - 98.0 fL Final  . MCH 08/24/2016 31.4  27.2 - 33.4 pg Final  . MCHC 08/24/2016 33.8  32.0 - 36.0 g/dL Final  . RBC 08/24/2016 3.69* 4.20 - 5.82 10e6/uL Final  . RDW 08/24/2016 15.7* 11.0 - 14.6 % Final  . lymph# 08/24/2016 0.0* 0.9 - 3.3 10e3/uL Final  . MONO# 08/24/2016 0.0* 0.1 - 0.9 10e3/uL Final  . Eosinophils Absolute 08/24/2016 0.0  0.0 - 0.5 10e3/uL Final  . Basophils Absolute 08/24/2016 0.0  0.0 - 0.1 10e3/uL Final  . NEUT% 08/24/2016 87.0* 39.0 - 75.0 % Final  . LYMPH% 08/24/2016 8.8* 14.0 - 49.0 % Final  . MONO% 08/24/2016 3.4  0.0 - 14.0 % Final  . EOS% 08/24/2016 0.7  0.0 - 7.0 % Final  . BASO% 08/24/2016 0.1  0.0 - 2.0 % Final  .  Sodium 08/24/2016 140  136 - 145 mEq/L Final  . Potassium 08/24/2016 4.9  3.5 - 5.1 mEq/L Final  . Chloride 08/24/2016 104  98 - 109 mEq/L Final  . CO2 08/24/2016 25  22 - 29 mEq/L Final  . Glucose 08/24/2016 105  70 - 140 mg/dl Final  . BUN 08/24/2016 32.0* 7.0 - 26.0 mg/dL Final  . Creatinine 08/24/2016 0.9  0.7 - 1.3 mg/dL Final  . Total Bilirubin 08/24/2016 0.77  0.20 - 1.20 mg/dL Final  . Alkaline Phosphatase 08/24/2016 122  40 - 150 U/L Final  . AST 08/24/2016 35* 5 - 34 U/L Final  . ALT 08/24/2016 52  0 - 55 U/L Final  . Total Protein 08/24/2016 6.0* 6.4 - 8.3 g/dL Final  . Albumin 08/24/2016 2.5* 3.5 - 5.0 g/dL Final  . Calcium 08/24/2016 9.8  8.4 - 10.4 mg/dL Final  . Anion Gap 08/24/2016 11  3 - 11 mEq/L Final  . EGFR 08/24/2016 89* >90 ml/min/1.73 m2 Final  . Technologist Review 08/24/2016 few helmets   Final    RADIOGRAPHIC STUDIES: No results found.  ASSESSMENT/PLAN:    Esophagitis Patient completed the radiation to his spine on 08/12/2016.  He presented to the [facility] today complaining of significant sore throat and inability to swallow anything orally.  He states that he gets choked drinking even a sip of water.  He feels very dehydrated today.  He is complaining of fatigue and generalized  weakness.  He also states he occasionally feels confused as well.  Patient states that he met with his primary care provider this past Friday, 08/20/2016; and was given a Levaquin antibiotic for treatment of any possible infection.  He continues take the Levaquin as directed.  On exam today.  Patient has a trace of fresh 2.  The tongue; but no other obvious oral lesions or erythema of the mouth.  History oropharynx appears clear as well.  Patient appears to be managing all oral secretions with no difficulty; but was noted to have a choking episode when he tried to take a sip of water.  Most likely, this is radiation-induced esophagitis; will hopefully clear soon.  In the meantime-patient was advised to take in thickened liquids only; and to return on a daily basis for IV fluid rehydration.  Also, will prescribe viscous lidocaine for the patient to try as well.  Confirmed the patient is a 30 picked up carafate as well.  Patient was asked to call/return or go directly to the emergency department for any worsening symptoms whatsoever.  Dehydration Patient is apparently suffering with some significant radiation-induced esophagitis; and is unable to take in anything orally at this time.  Patient appears dehydrated.  Will receive IV fluid rehydration today and will return tomorrow for additional IV fluid rehydration.  Chemotherapy induced neutropenia [facility]) Patient received his first cycle of chemotherapy on 08/19/2016.  WBC is 0.5 and ANC 0.4 today.  Briefly reviewed all neutropenia guidelines with both patient and his family today.  Today-patient was afebrile and vital signs were essentially stable.  Also, patient continues to take Levaquin as previously directed.  Patient appears weak and fatigued; he does not appear toxic today.  We'll continue to monitor closely.  Adenocarcinoma of right lung, stage 4 ([place]) Patient received cycle one of his carboplatin/Alimta/Avastin.  Chemotherapy therapy regimen  on 08/19/2016.  He will return tomorrow to receive additional IV fluid rehydration.  He will return on 08/26/2016 for labs, visit, and additional IV fluid rehydration.  Also, will return on 09/02/2016 for labs.  Will add a follow-up visit with Dr. Julien Nordmann on 09/02/2016 as well.   Patient stated understanding of all instructions; and was in agreement with this plan of care. The patient knows to call the clinic with any problems, questions or concerns.   Total time spent with patient was 25 minutes;  with greater than 75 percent of that time spent in face to face counseling regarding patient's symptoms,  and coordination of care and follow up.  Disclaimer:This dictation was prepared with Dragon/digital dictation along with [employer]. Any transcriptional errors that result from this process are unintentional.  Drue Second, NP 08/24/2016   ADDENDUM: Hematology/Oncology Attending: I had a face to face encounter with the patient. I recommended his care plan. This is a very pleasant 71 years old white male with metastatic non-small cell lung cancer, adenocarcinoma with no actionable mutations. The patient is status post a course of palliative radiotherapy to the metastatic disease at C7-T2, T6 and L1. Unfortunately he has significant dysphagia and odynophagia secondary to radiation induced esophagitis. The patient also has lack by mouth intake and poor hydration. He is unable to drink or eat much these days. He was also started recently on systemic chemotherapy with carboplatin, Alimta and Avastin status post 1 cycle. I had a lengthy discussion with the patient and his family about his condition. I strongly recommended for the patient to continue with the Carafate 4 times daily. We will also start the patient on viscous lidocaine. We will arrange for him to receive IV hydration with normal saline in the clinic and probably would consider IV hydration daily for the next few days. He has an  appointment with pain 2 days and will see the patient back for follow-up visit at that time. If no improvement in his condition, we will consider admitting the patient to the hospital for further evaluation and consultation with gastroenterology for management of his radiation induced esophagitis. The patient and his family agreed to the current plan. Disclaimer: This note was dictated with voice recognition software. Similar sounding words can inadvertently be transcribed and may be missed upon review. Eilleen Kempf., MD 08/25/16

## 2016-08-25 ENCOUNTER — Inpatient Hospital Stay (HOSPITAL_COMMUNITY)
Admission: AD | Admit: 2016-08-25 | Discharge: 2016-08-30 | DRG: 391 | Disposition: A | Discharge status: Hospice | Payer: Medicare Other | Source: Ambulatory Visit | Attending: Family Medicine | Admitting: Family Medicine

## 2016-08-25 ENCOUNTER — Inpatient Hospital Stay (HOSPITAL_COMMUNITY): Payer: Medicare Other

## 2016-08-25 ENCOUNTER — Ambulatory Visit (HOSPITAL_BASED_OUTPATIENT_CLINIC_OR_DEPARTMENT_OTHER): Payer: Medicare Other | Admitting: Nurse Practitioner

## 2016-08-25 ENCOUNTER — Other Ambulatory Visit: Payer: Self-pay | Admitting: Medical Oncology

## 2016-08-25 ENCOUNTER — Encounter (HOSPITAL_COMMUNITY): Payer: Self-pay

## 2016-08-25 ENCOUNTER — Encounter: Payer: Self-pay | Admitting: Nurse Practitioner

## 2016-08-25 VITALS — BP 125/67 | HR 105 | Temp 98.0°F | Resp 16 | Ht 71.0 in

## 2016-08-25 DIAGNOSIS — R1310 Dysphagia, unspecified: Secondary | ICD-10-CM

## 2016-08-25 DIAGNOSIS — D701 Agranulocytosis secondary to cancer chemotherapy: Secondary | ICD-10-CM

## 2016-08-25 DIAGNOSIS — K209 Esophagitis, unspecified without bleeding: Secondary | ICD-10-CM

## 2016-08-25 DIAGNOSIS — T451X5A Adverse effect of antineoplastic and immunosuppressive drugs, initial encounter: Secondary | ICD-10-CM | POA: Diagnosis present

## 2016-08-25 DIAGNOSIS — C3491 Malignant neoplasm of unspecified part of right bronchus or lung: Secondary | ICD-10-CM

## 2016-08-25 DIAGNOSIS — J189 Pneumonia, unspecified organism: Secondary | ICD-10-CM | POA: Diagnosis present

## 2016-08-25 DIAGNOSIS — R21 Rash and other nonspecific skin eruption: Secondary | ICD-10-CM | POA: Diagnosis not present

## 2016-08-25 DIAGNOSIS — D61810 Antineoplastic chemotherapy induced pancytopenia: Secondary | ICD-10-CM | POA: Diagnosis present

## 2016-08-25 DIAGNOSIS — Y842 Radiological procedure and radiotherapy as the cause of abnormal reaction of the patient, or of later complication, without mention of misadventure at the time of the procedure: Secondary | ICD-10-CM | POA: Diagnosis present

## 2016-08-25 DIAGNOSIS — E860 Dehydration: Secondary | ICD-10-CM | POA: Diagnosis present

## 2016-08-25 DIAGNOSIS — G893 Neoplasm related pain (acute) (chronic): Secondary | ICD-10-CM | POA: Diagnosis present

## 2016-08-25 DIAGNOSIS — Z7982 Long term (current) use of aspirin: Secondary | ICD-10-CM

## 2016-08-25 DIAGNOSIS — Z66 Do not resuscitate: Secondary | ICD-10-CM | POA: Diagnosis not present

## 2016-08-25 DIAGNOSIS — I10 Essential (primary) hypertension: Secondary | ICD-10-CM | POA: Diagnosis present

## 2016-08-25 DIAGNOSIS — Z79899 Other long term (current) drug therapy: Secondary | ICD-10-CM

## 2016-08-25 DIAGNOSIS — J90 Pleural effusion, not elsewhere classified: Secondary | ICD-10-CM

## 2016-08-25 DIAGNOSIS — I878 Other specified disorders of veins: Secondary | ICD-10-CM

## 2016-08-25 DIAGNOSIS — R634 Abnormal weight loss: Secondary | ICD-10-CM | POA: Diagnosis present

## 2016-08-25 DIAGNOSIS — K208 Other esophagitis: Principal | ICD-10-CM | POA: Diagnosis present

## 2016-08-25 DIAGNOSIS — Z79891 Long term (current) use of opiate analgesic: Secondary | ICD-10-CM

## 2016-08-25 DIAGNOSIS — R627 Adult failure to thrive: Secondary | ICD-10-CM | POA: Diagnosis present

## 2016-08-25 DIAGNOSIS — H409 Unspecified glaucoma: Secondary | ICD-10-CM | POA: Diagnosis present

## 2016-08-25 DIAGNOSIS — Z8673 Personal history of transient ischemic attack (TIA), and cerebral infarction without residual deficits: Secondary | ICD-10-CM

## 2016-08-25 DIAGNOSIS — Z515 Encounter for palliative care: Secondary | ICD-10-CM | POA: Diagnosis not present

## 2016-08-25 DIAGNOSIS — C7951 Secondary malignant neoplasm of bone: Secondary | ICD-10-CM

## 2016-08-25 DIAGNOSIS — Z6821 Body mass index (BMI) 21.0-21.9, adult: Secondary | ICD-10-CM | POA: Diagnosis not present

## 2016-08-25 DIAGNOSIS — Z7952 Long term (current) use of systemic steroids: Secondary | ICD-10-CM

## 2016-08-25 MED ORDER — HYDRALAZINE HCL 20 MG/ML IJ SOLN: 10.0000 mg | Freq: Four times a day (QID) | INTRAMUSCULAR | Status: DC | PRN

## 2016-08-25 MED ORDER — SODIUM CHLORIDE 0.9 % IV SOLN
INTRAVENOUS | Status: DC
  Administered 2016-08-25 – 2016-08-27 (×5): via INTRAVENOUS

## 2016-08-25 MED ORDER — LEVOFLOXACIN IN D5W 500 MG/100ML IV SOLN
500.0000 mg | INTRAVENOUS | Status: DC
  Administered 2016-08-25 – 2016-08-29 (×5): 500 mg via INTRAVENOUS
  Filled 2016-08-25 (×5): qty 100

## 2016-08-25 MED ORDER — MORPHINE SULFATE (PF) 4 MG/ML IV SOLN
2.0000 mg | INTRAVENOUS | Status: DC | PRN
  Administered 2016-08-25: 2 mg via INTRAVENOUS
  Administered 2016-08-26: 4 mg via INTRAVENOUS
  Administered 2016-08-26: 2 mg via INTRAVENOUS
  Administered 2016-08-26 – 2016-08-27 (×4): 4 mg via INTRAVENOUS
  Filled 2016-08-25 (×8): qty 1

## 2016-08-25 MED ORDER — TBO-FILGRASTIM 300 MCG/0.5ML ~~LOC~~ SOSY
300.0000 ug | PREFILLED_SYRINGE | Freq: Once | SUBCUTANEOUS | Status: AC
  Administered 2016-08-25: 300 ug via SUBCUTANEOUS
  Filled 2016-08-25: qty 0.5

## 2016-08-25 MED ORDER — ONDANSETRON HCL 4 MG/2ML IJ SOLN: 4.0000 mg | Freq: Four times a day (QID) | INTRAMUSCULAR | Status: DC | PRN

## 2016-08-25 MED ORDER — DEXAMETHASONE SODIUM PHOSPHATE 10 MG/ML IJ SOLN
4.0000 mg | Freq: Two times a day (BID) | INTRAMUSCULAR | Status: DC
  Administered 2016-08-25 – 2016-08-29 (×9): 4 mg via INTRAVENOUS
  Filled 2016-08-25 (×9): qty 1

## 2016-08-25 MED ORDER — ENOXAPARIN SODIUM 40 MG/0.4ML ~~LOC~~ SOLN
40.0000 mg | SUBCUTANEOUS | Status: DC
  Administered 2016-08-25: 40 mg via SUBCUTANEOUS
  Filled 2016-08-25: qty 0.4

## 2016-08-25 MED ORDER — ONDANSETRON HCL 4 MG PO TABS
4.0000 mg | ORAL_TABLET | Freq: Four times a day (QID) | ORAL | Status: DC | PRN
Start: 2016-08-25 — End: 2016-08-30

## 2016-08-25 MED ORDER — SODIUM CHLORIDE 0.9 % IV SOLN
INTRAVENOUS | Status: DC
  Administered 2016-08-25: 12:00:00 via INTRAVENOUS

## 2016-08-25 MED ORDER — PANTOPRAZOLE SODIUM 40 MG IV SOLR
40.0000 mg | Freq: Two times a day (BID) | INTRAVENOUS | Status: DC
  Administered 2016-08-25 – 2016-08-30 (×11): 40 mg via INTRAVENOUS
  Filled 2016-08-25 (×11): qty 40

## 2016-08-25 MED ORDER — SUCRALFATE 1 GM/10ML PO SUSP
1.0000 g | Freq: Three times a day (TID) | ORAL | Status: DC
  Administered 2016-08-25 – 2016-08-29 (×4): 1 g via ORAL
  Filled 2016-08-25 (×6): qty 10

## 2016-08-25 NOTE — Assessment & Plan Note (Signed)
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  He presented to the [facility] today complaining of significant sore throat and inability to swallow anything orally.  He states that he gets choked drinking even a sip of water.  He feels very dehydrated today.  He is complaining of fatigue and generalized weakness.  He also states he occasionally feels confused as well.  Patient states that he met with his primary care provider this past Friday, 08/20/2016; and was given a Levaquin antibiotic for treatment of any possible infection.  He continues take the Levaquin as directed.  On exam today.  Patient has a trace of fresh 2.  The tongue; but no other obvious oral lesions or erythema of the mouth.  History oropharynx appears clear as well.  Patient appears to be managing all oral secretions with no difficulty; but was noted to have a choking episode when he tried to take a sip of water.  Most likely, this is radiation-induced esophagitis; will hopefully clear soon.  In the meantime-patient was advised to take in thickened liquids only; and to return on a daily basis for IV fluid rehydration.  Also, will prescribe viscous lidocaine for the patient to try as well.  Confirmed the patient is a 30 picked up carafate as well.  Patient was asked to call/return or go directly to the emergency department for any worsening symptoms whatsoever. ___________________________________________________  Update: Patient presents back to the [facility] today to receive additional IV fluid rehydration.  However, patient states she's feeling much worse today than yesterday.  He continues to feel very weak and dehydrated.  He states he was unable to swallow the viscous lidocaine to see if that would even help his sore throat.  He continues with minimal to no voice.  He appears even more dehydrated today; and required multiple IV sticks to get an IV started.  Vital signs today reveal that patient has a  little more tachycardic as well.  Reviewed all with Dr. Julien Nordmann; he recommended the patient be direct admitted to the hospital for further evaluation and management.  Was able to review all with Dr. Cruzita Lederer hospitalist; he is in agreement to admit patient as soon as a bed is available.  Brief history and review of the patient's case was reviewed with hospitalist.  Patient will be direct admitted to [address] oncology unit within the next 30 minutes or so.  Patient will be transported to the direct floor bed via wheelchair.  Per the cancer Center nurse.  CODE STATUS: Patient should be considered a full code; since he has no advance directives in the patient's chart.

## 2016-08-25 NOTE — Assessment & Plan Note (Signed)
Patient received his first cycle of chemotherapy on 08/19/2016.  WBC is 0.5 and ANC 0.4 today.  Briefly reviewed all neutropenia guidelines with both patient and his family today.  Today-patient was afebrile and vital signs were essentially stable.  Also, patient continues to take Levaquin as previously directed.  Patient appears weak and fatigued; he does not appear toxic today.

## 2016-08-25 NOTE — Progress Notes (Signed)
E requesting admission to hospital as patient is unable to swallow any liquids even viscous lidocaine.  Had Selena Lesser, NP come in to evaluate pt. She did this as well as spoke with Dr. Julien Nordmann. Pt will be admitted.  Pt transferred to [facility] room # 1322 via w/c. Family in attendance.  Peripheral IV capped.

## 2016-08-25 NOTE — H&P (Signed)
History and Physical    Warren Odonnell BJS:[id number] DOB: 19-Oct-1945 DOA: 08/25/2016  I have briefly reviewed the patient's prior medical records in [place]  PCP: Shirline Frees, MD  Patient coming from: home  Chief Complaint: dysphagia  HPI: Warren Odonnell is a 71 y.o. male with medical history significant of stage IV lung cancer with bone mets, prior CVA, currently is getting chemotherapy and radiation therapy, seen by Dr. Earlie Server in office, who presents to the hospital directly from the [facility] due to progressive dysphagia over the last week. Patient tells me that he has been having difficulties swallowing liquids and solids and this has been getting worse over the last week. He has gotten radiation therapy to his C7 as well as a thoracic vertebra finished about a couple weeks ago. He reports that he feels that the food is getting stuck in the middle of his chest and is quite painful when he tries to force food down. He was evaluated in the cancer center yesterday, and had a repeat visit today, and felt like he was significantly dehydrated with inability to have any by mouth intake and he was directed for admission for hydration and further investigation of his dysphagia. He had some choking episodes at home. He was seen by his PCP on 3/16 and was placed on Levaquin out of concern for pneumonia. Currently he denies any fever or chills, he has no abdominal pain, no chest pain. He is also noticed that he has been losing his voice over the same period of time.    Review of Systems: As per HPI otherwise 10 point review of systems negative.   Past Medical History:  Diagnosis Date  . Adenocarcinoma of right lung, stage 4 ([place]) 07/30/2016  . Bone metastases ([place]) 07/23/2016  . 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 Encounter for antineoplastic chemotherapy 08/10/2016  . Glaucoma   . Goals of care, counseling/discussion 08/10/2016  . Testicular swelling   . Weight loss 07/24/2016     Past Surgical History:  Procedure Laterality Date  . HERNIA REPAIR  1984  . testicular torsion Left      reports that he has never smoked. He has never used smokeless tobacco. He reports that he does not drink alcohol or use drugs.  No Known Allergies  Family history reviewed and negative for pertinent findings  Prior to Admission medications   Medication Sig Start Date End Date Taking? Authorizing Provider  amLODipine (NORVASC) 10 MG tablet Take 1 tablet (10 mg total) by mouth daily. 07/28/16   Belkys A Regalado, MD  aspirin 325 MG tablet Take 1 tablet (325 mg total) by mouth daily. 07/28/16   Belkys A Regalado, MD  atorvastatin (LIPITOR) 20 MG tablet Take 1 tablet (20 mg total) by mouth daily at 6 PM. 07/28/16   Belkys A Regalado, MD  bimatoprost (LUMIGAN) 0.01 % SOLN Place 1 drop into both eyes at bedtime.    Historical Provider, MD  dexamethasone (DECADRON) 4 MG tablet Take 1 tablet (4 mg total) by mouth 2 (two) times daily. 08/24/16   Susanne Borders, NP  dorzolamide (TRUSOPT) 2 % ophthalmic solution Place 1 drop into both eyes 2 (two) times daily.     Historical Provider, MD  dronabinol (MARINOL) 2.5 MG capsule Take 1 capsule (2.5 mg total) by mouth 2 (two) times daily before lunch and supper. 08/10/16   Curt Bears, MD  esomeprazole (NEXIUM) 20 MG capsule Take 20 mg by mouth daily.  Historical Provider, MD  folic acid (FOLVITE) 1 MG tablet Take 1 tablet (1 mg total) by mouth daily. 08/10/16   Curt Bears, MD  hydrALAZINE (APRESOLINE) 25 MG tablet Take 1 tablet (25 mg total) by mouth every 8 (eight) hours. Patient not taking: Reported on 08/24/2016 07/28/16   Belkys A Regalado, MD  HYDROcodone-acetaminophen (HYCET) 7.5-325 mg/15 ml solution Take 5-15 mLs by mouth 4 (four) times daily as needed for moderate pain (5 cc 20 min before meals). Patient not taking: Reported on 08/24/2016 08/23/16   Tyler Pita, MD  HYDROcodone-acetaminophen (NORCO/VICODIN) 5-325 MG tablet Take 1  tablet by mouth 3 (three) times daily as needed for moderate pain.    Historical Provider, MD  levofloxacin (LEVAQUIN) 500 MG tablet Take 500 mg by mouth daily.    Historical Provider, MD  lidocaine (XYLOCAINE) 2 % solution Use as directed 15 mLs in the mouth or throat every 6 (six) hours as needed for mouth pain. 08/24/16   Susanne Borders, NP  morphine (MS CONTIN) 15 MG 12 hr tablet Take 1 tablet (15 mg total) by mouth every 12 (twelve) hours. 08/04/16   Tyler Pita, MD  Multiple Vitamin (MULTIVITAMIN WITH MINERALS) TABS tablet Take 1 tablet by mouth daily.    Historical Provider, MD  Multiple Vitamins-Minerals (PRESERVISION AREDS 2+MULTI VIT) CAPS Take 1 capsule by mouth 2 (two) times daily.    Historical Provider, MD  ondansetron (ZOFRAN) 8 MG tablet Take 8 mg by mouth every 8 (eight) hours as needed for nausea or vomiting.    Historical Provider, MD  polyethylene glycol (MIRALAX / GLYCOLAX) packet Take 17 g by mouth 2 (two) times daily. 07/28/16   Belkys A Regalado, MD  senna-docusate (SENOKOT-S) 8.6-50 MG tablet Take 2 tablets by mouth 2 (two) times daily. 07/28/16   Belkys A Regalado, MD  sucralfate (CARAFATE) 1 g tablet TAKE 1 TABLET BY MOUTH FOUR TIMES DAILY WITH MEALS( 5 MINUTES BEFORE MEALS FOR RADIATION INDLUCED ESOPHAGITIS) AND AT BEDTIME 08/18/16   Tyler Pita, MD  triamcinolone cream (KENALOG) 0.1 % Apply 1 application topically 2 (two) times daily as needed (for rash).     Historical Provider, MD    Physical Exam: There were no vitals filed for this visit.    Constitutional: NAD, calm, comfortable There were no vitals filed for this visit. Eyes: PERRL, lids and conjunctivae normal ENMT: Mucous membranes are moist. Posterior pharynx clear of any exudate or lesions. Neck: normal, supple, no masses Respiratory: clear to auscultation bilaterally, no wheezing, no crackles. Normal respiratory effort. No accessory muscle use.  Cardiovascular: Regular rate and rhythm, no murmurs /  rubs / gallops. No extremity edema. 2+ pedal pulses.  Abdomen: no tenderness, no masses palpated. Bowel sounds positive.  Musculoskeletal: no clubbing / cyanosis. Normal muscle tone.  Skin: no rashes, lesions, ulcers. No induration Neurologic: CN 2-12 grossly intact. Strength 5/5 in all 4. Mild tremor noted Psychiatric: Normal judgment and insight. Alert and oriented x 3. Normal mood.   Labs on Admission: I have personally reviewed following labs and imaging studies  CBC:  Recent Labs Lab 08/19/16 1342 08/24/16 1415  WBC 8.3 0.5*  NEUTROABS 7.6* 0.4*  HGB 11.5* 11.6*  HCT 33.9* 34.3*  MCV 92.7 92.9  PLT 199 465   Basic Metabolic Panel:  Recent Labs Lab 08/19/16 1342 08/24/16 1415  NA 136 140  K 4.3 4.9  CO2 23 25  GLUCOSE 106 105  BUN 26.6* 32.0*  CREATININE 0.8 0.9  CALCIUM 9.3 9.8  GFR: Estimated Creatinine Clearance: 74.6 mL/min (by C-G formula based on SCr of 0.9 mg/dL). Liver Function Tests:  Recent Labs Lab 08/19/16 1342 08/24/16 1415  AST 30 35*  ALT 53 52  ALKPHOS 104 122  BILITOT 0.99 0.77  PROT 5.8* 6.0*  ALBUMIN 2.6* 2.5*   No results for input(s): LIPASE, AMYLASE in the last 168 hours. No results for input(s): AMMONIA in the last 168 hours. Coagulation Profile: No results for input(s): INR, PROTIME in the last 168 hours. Cardiac Enzymes: No results for input(s): CKTOTAL, CKMB, CKMBINDEX, TROPONINI in the last 168 hours. BNP (last 3 results) No results for input(s): PROBNP in the last 8760 hours. HbA1C: No results for input(s): HGBA1C in the last 72 hours. CBG: No results for input(s): GLUCAP in the last 168 hours. Lipid Profile: No results for input(s): CHOL, HDL, LDLCALC, TRIG, CHOLHDL, LDLDIRECT in the last 72 hours. Thyroid Function Tests: No results for input(s): TSH, T4TOTAL, FREET4, T3FREE, THYROIDAB in the last 72 hours. Anemia Panel: No results for input(s): VITAMINB12, FOLATE, FERRITIN, TIBC, IRON, RETICCTPCT in the last 72  hours. Urine analysis:    Component Value Date/Time   COLORURINE YELLOW 05/09/2016 1107   APPEARANCEUR CLEAR 05/09/2016 1107   LABSPEC 1.017 05/09/2016 1107   PHURINE 7.0 05/09/2016 1107   GLUCOSEU NEGATIVE 05/09/2016 1107   HGBUR NEGATIVE 05/09/2016 1107   BILIRUBINUR NEGATIVE 05/09/2016 1107   KETONESUR 15 (A) 05/09/2016 1107   PROTEINUR < 30 08/19/2016 1342   NITRITE NEGATIVE 05/09/2016 1107   LEUKOCYTESUR TRACE (A) 05/09/2016 1107     Radiological Exams on Admission: No results found.   Assessment/Plan Active Problems:   Bone metastases ([facility])   Cancer associated pain   Adenocarcinoma of right lung, stage 4 ([facility])   Hypertension   Esophagitis   Chemotherapy induced neutropenia ([place])   Dehydration   Dysphagia    Dysphagia -Consent for radiation-induced esophagitis, will place patient on PPI, Carafate. I've consulted gastroenterology, discussed with Dr. Paulita Fujita who was seen in consultation tomorrow morning. -will order barium swallow -Patient is neutropenic and unlikely to get an EGD tomorrow  Stage IV lung cancer -Currently managed by Dr. Julien Nordmann, discussed with him today -Patient is on Decadron, will change to IV  ? Pneumonia -Patient was started on Levaquin by PCP, for a "respiratory infection", we'll repeat a chest x-ray and continue Levaquin. He has been on it for 5 days, may need to be discontinued in 1-2 days   Chemotherapy-induced neutropenia -ANC 400 yesterday, we'll repeat CBC with differential tomorrow morning -Neupogen as suggested by Dr. Julien Nordmann -Platelets are hemoglobin were within normal limits, we'll repeat in the morning  Hypertension -Resume home antihypertensives, will place on when necessary hydralazine if he is unable to take by mouth  Weight loss/failure to thrive -Patient has been having a 30 pound weight loss in the recent months, he has been having difficulties ambulating at home with a walker, will ask physical therapy to  evaluate.   DVT prophylaxis: Lovenox  Code Status: Full code  Family Communication: d/w wife bedside Disposition Plan: admit to medsurg Consults called: GI, Dr. Paulita Fujita     Admission status: inpatient    At the time of admission, it appears that the appropriate admission status for this patient is INPATIENT. This is judged to be reasonable and necessary in order to provide the required high service intensity to ensure the patient's safety given the presenting symptoms, physical exam findings, and initial radiographic and laboratory data in the context of  their chronic comorbidities and inability for any by mouth intake. Current circumstances are felt to place patient at high risk for further clinical deterioration threatening life, limb, or organ. Moreover, it is my clinical judgment that the patient will require inpatient hospital care spanning beyond 2 midnights from the point of admission and that early discharge would result in unnecessary risk of decompensation and readmission or threat to life, limb or bodily function.   Marzetta Board, MD [facility] Pager [phone]  If 7PM-7AM, please contact night-coverage [email or web address] Password [facility]  08/25/2016, 2:33 PM

## 2016-08-25 NOTE — Progress Notes (Signed)
SYMPTOM MANAGEMENT CLINIC    Chief Complaint: Esophagitis, dehydration  HPI:  Warren Odonnell 71 y.o. male diagnosed with lung cancer; with bone metastasis.  Currently undergoing carboplatin/Alimta/Avastin chemotherapy regimen.    No history exists.    Review of Systems  Constitutional: Positive for malaise/fatigue and weight loss.  HENT: Positive for sore throat.   Neurological: Positive for weakness.  All other systems reviewed and are negative.   Past Medical History:  Diagnosis Date  . Adenocarcinoma of right lung, stage 4 ([place]) 07/30/2016  . Bone metastases ([place]) 07/23/2016  . 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 Encounter for antineoplastic chemotherapy 08/10/2016  . Glaucoma   . Goals of care, counseling/discussion 08/10/2016  . Testicular swelling   . Weight loss 07/24/2016    Past Surgical History:  Procedure Laterality Date  . HERNIA REPAIR  1984  . testicular torsion Left     has Bone metastases ([place]); Cancer associated pain; Weight loss; Stroke ([place]); Delirium; Lacunar stroke ([place]); Adenocarcinoma of right lung, stage 4 ([place]); Goals of care, counseling/discussion; Encounter for antineoplastic chemotherapy; Hypertension; Esophagitis; Chemotherapy induced neutropenia ([place]); and Dehydration on his problem list.    has No Known Allergies.  Allergies as of 08/25/2016   No Known Allergies     Medication List       Accurate as of 08/25/16 12:35 PM. Always use your most recent med list.          amLODipine 10 MG tablet Commonly known as:  NORVASC Take 1 tablet (10 mg total) by mouth daily.   aspirin 325 MG tablet Take 1 tablet (325 mg total) by mouth daily.   atorvastatin 20 MG tablet Commonly known as:  LIPITOR Take 1 tablet (20 mg total) by mouth daily at 6 PM.   bimatoprost 0.01 % Soln Commonly known as:  LUMIGAN Place 1 drop into both eyes at bedtime.   dexamethasone 4 MG tablet Commonly known as:  DECADRON Take 1 tablet (4 mg total) by mouth 2  (two) times daily.   dorzolamide 2 % ophthalmic solution Commonly known as:  TRUSOPT Place 1 drop into both eyes 2 (two) times daily.   dronabinol 2.5 MG capsule Commonly known as:  MARINOL Take 1 capsule (2.5 mg total) by mouth 2 (two) times daily before lunch and supper.   esomeprazole 20 MG capsule Commonly known as:  NEXIUM Take 20 mg by mouth daily.   folic acid 1 MG tablet Commonly known as:  FOLVITE Take 1 tablet (1 mg total) by mouth daily.   hydrALAZINE 25 MG tablet Commonly known as:  APRESOLINE Take 1 tablet (25 mg total) by mouth every 8 (eight) hours.   HYDROcodone-acetaminophen 5-325 MG tablet Commonly known as:  NORCO/VICODIN Take 1 tablet by mouth 3 (three) times daily as needed for moderate pain.   HYDROcodone-acetaminophen 7.5-325 mg/15 ml solution Commonly known as:  HYCET Take 5-15 mLs by mouth 4 (four) times daily as needed for moderate pain (5 cc 20 min before meals).   levofloxacin 500 MG tablet Commonly known as:  LEVAQUIN Take 500 mg by mouth daily.   lidocaine 2 % solution Commonly known as:  XYLOCAINE Use as directed 15 mLs in the mouth or throat every 6 (six) hours as needed for mouth pain.   morphine 15 MG 12 hr tablet Commonly known as:  MS CONTIN Take 1 tablet (15 mg total) by mouth every 12 (twelve) hours.   multivitamin with minerals Tabs tablet Take 1 tablet by mouth  daily.   ondansetron 8 MG tablet Commonly known as:  ZOFRAN Take 8 mg by mouth every 8 (eight) hours as needed for nausea or vomiting.   polyethylene glycol packet Commonly known as:  MIRALAX / GLYCOLAX Take 17 g by mouth 2 (two) times daily.   PRESERVISION AREDS 2+MULTI VIT Caps Take 1 capsule by mouth 2 (two) times daily.   senna-docusate 8.6-50 MG tablet Commonly known as:  Senokot-S Take 2 tablets by mouth 2 (two) times daily.   sucralfate 1 g tablet Commonly known as:  CARAFATE TAKE 1 TABLET BY MOUTH FOUR TIMES DAILY WITH MEALS( 5 MINUTES BEFORE MEALS  FOR RADIATION INDLUCED ESOPHAGITIS) AND AT BEDTIME   triamcinolone cream 0.1 % Commonly known as:  KENALOG Apply 1 application topically 2 (two) times daily as needed (for rash).        PHYSICAL EXAMINATION  Oncology Vitals 08/25/2016 08/24/2016  Height 180 cm -  Weight - -  Weight (lbs) - -  BMI (kg/m2) 20.95 kg/m2 -  Temp 98 -  Pulse 105 77  Resp 16 18  SpO2 95 93  BSA (m2) 1.85 m2 -   BP Readings from Last 2 Encounters:  08/25/16 125/67  08/24/16 (!) 143/65    Physical Exam  Constitutional: He is oriented to person, place, and time. He appears malnourished and dehydrated. He appears unhealthy. He appears cachectic.  HENT:  Head: Normocephalic and atraumatic.  Mouth/Throat: Oropharynx is clear and moist.  Eyes: Conjunctivae and EOM are normal. Pupils are equal, round, and reactive to light. Right eye exhibits no discharge. Left eye exhibits no discharge. No scleral icterus.  Neck: Normal range of motion. Neck supple. No JVD present. No tracheal deviation present. No thyromegaly present.  Cardiovascular: Normal rate, regular rhythm, normal heart sounds and intact distal pulses.   Pulmonary/Chest: Effort normal and breath sounds normal. No respiratory distress. He has no wheezes. He has no rales. He exhibits no tenderness.  Abdominal: Soft. Bowel sounds are normal. He exhibits no distension and no mass. There is no tenderness. There is no rebound and no guarding.  Musculoskeletal: Normal range of motion. He exhibits no edema, tenderness or deformity.  Lymphadenopathy:    He has no cervical adenopathy.  Neurological: He is alert and oriented to person, place, and time. Gait normal.  Skin: Skin is warm and dry. No rash noted. No erythema. There is pallor.  Psychiatric: Affect normal.  Nursing note and vitals reviewed.   LABORATORY DATA:. Appointment on 08/24/2016  Component Date Value Ref Range Status  . WBC 08/24/2016 0.5* 4.0 - 10.3 10e3/uL Final  . NEUT# 08/24/2016  0.4* 1.5 - 6.5 10e3/uL Final  . HGB 08/24/2016 11.6* 13.0 - 17.1 g/dL Final  . HCT 08/24/2016 34.3* 38.4 - 49.9 % Final  . Platelets 08/24/2016 171  140 - 400 10e3/uL Final  . MCV 08/24/2016 92.9  79.3 - 98.0 fL Final  . MCH 08/24/2016 31.4  27.2 - 33.4 pg Final  . MCHC 08/24/2016 33.8  32.0 - 36.0 g/dL Final  . RBC 08/24/2016 3.69* 4.20 - 5.82 10e6/uL Final  . RDW 08/24/2016 15.7* 11.0 - 14.6 % Final  . lymph# 08/24/2016 0.0* 0.9 - 3.3 10e3/uL Final  . MONO# 08/24/2016 0.0* 0.1 - 0.9 10e3/uL Final  . Eosinophils Absolute 08/24/2016 0.0  0.0 - 0.5 10e3/uL Final  . Basophils Absolute 08/24/2016 0.0  0.0 - 0.1 10e3/uL Final  . NEUT% 08/24/2016 87.0* 39.0 - 75.0 % Final  . LYMPH% 08/24/2016 8.8* 14.0 -  49.0 % Final  . MONO% 08/24/2016 3.4  0.0 - 14.0 % Final  . EOS% 08/24/2016 0.7  0.0 - 7.0 % Final  . BASO% 08/24/2016 0.1  0.0 - 2.0 % Final  . Sodium 08/24/2016 140  136 - 145 mEq/L Final  . Potassium 08/24/2016 4.9  3.5 - 5.1 mEq/L Final  . Chloride 08/24/2016 104  98 - 109 mEq/L Final  . CO2 08/24/2016 25  22 - 29 mEq/L Final  . Glucose 08/24/2016 105  70 - 140 mg/dl Final  . BUN 08/24/2016 32.0* 7.0 - 26.0 mg/dL Final  . Creatinine 08/24/2016 0.9  0.7 - 1.3 mg/dL Final  . Total Bilirubin 08/24/2016 0.77  0.20 - 1.20 mg/dL Final  . Alkaline Phosphatase 08/24/2016 122  40 - 150 U/L Final  . AST 08/24/2016 35* 5 - 34 U/L Final  . ALT 08/24/2016 52  0 - 55 U/L Final  . Total Protein 08/24/2016 6.0* 6.4 - 8.3 g/dL Final  . Albumin 08/24/2016 2.5* 3.5 - 5.0 g/dL Final  . Calcium 08/24/2016 9.8  8.4 - 10.4 mg/dL Final  . Anion Gap 08/24/2016 11  3 - 11 mEq/L Final  . EGFR 08/24/2016 89* >90 ml/min/1.73 m2 Final  . Technologist Review 08/24/2016 few helmets   Final    RADIOGRAPHIC STUDIES: No results found.  ASSESSMENT/PLAN:    Esophagitis Patient completed the radiation to his spine on 08/12/2016.  He presented to the [facility] today complaining of significant sore throat and  inability to swallow anything orally.  He states that he gets choked drinking even a sip of water.  He feels very dehydrated today.  He is complaining of fatigue and generalized weakness.  He also states he occasionally feels confused as well.  Patient states that he met with his primary care provider this past Friday, 08/20/2016; and was given a Levaquin antibiotic for treatment of any possible infection.  He continues take the Levaquin as directed.  On exam today.  Patient has a trace of fresh 2.  The tongue; but no other obvious oral lesions or erythema of the mouth.  History oropharynx appears clear as well.  Patient appears to be managing all oral secretions with no difficulty; but was noted to have a choking episode when he tried to take a sip of water.  Most likely, this is radiation-induced esophagitis; will hopefully clear soon.  In the meantime-patient was advised to take in thickened liquids only; and to return on a daily basis for IV fluid rehydration.  Also, will prescribe viscous lidocaine for the patient to try as well.  Confirmed the patient is a 30 picked up carafate as well.  Patient was asked to call/return or go directly to the emergency department for any worsening symptoms whatsoever. ___________________________________________________  Update: Patient presents back to the [facility] today to receive additional IV fluid rehydration.  However, patient states she's feeling much worse today than yesterday.  He continues to feel very weak and dehydrated.  He states he was unable to swallow the viscous lidocaine to see if that would even help his sore throat.  He continues with minimal to no voice.  He appears even more dehydrated today; and required multiple IV sticks to get an IV started.  Vital signs today reveal that patient has a little more tachycardic as well.  Reviewed all with Dr. Julien Nordmann; he recommended the patient be direct admitted to the hospital for further  evaluation and management.  Was able to review all  with Dr. Cruzita Lederer hospitalist; he is in agreement to admit patient as soon as a bed is available.  Brief history and review of the patient's case was reviewed with hospitalist.  Patient will be direct admitted to [address] oncology unit within the next 30 minutes or so.  Patient will be transported to the direct floor bed via wheelchair.  Per the cancer Center nurse.  CODE STATUS: Patient should be considered a full code; since he has no advance directives in the patient's chart.  Dehydration Patient completed the radiation to his spine on 08/12/2016.  He presented to the [facility] today complaining of significant sore throat and inability to swallow anything orally.  He states that he gets choked drinking even a sip of water.  He feels very dehydrated today.  He is complaining of fatigue and generalized weakness.  He also states he occasionally feels confused as well.  Patient states that he met with his primary care provider this past Friday, 08/20/2016; and was given a Levaquin antibiotic for treatment of any possible infection.  He continues take the Levaquin as directed.  On exam today.  Patient has a trace of fresh 2.  The tongue; but no other obvious oral lesions or erythema of the mouth.  History oropharynx appears clear as well.  Patient appears to be managing all oral secretions with no difficulty; but was noted to have a choking episode when he tried to take a sip of water.  Most likely, this is radiation-induced esophagitis; will hopefully clear soon.  In the meantime-patient was advised to take in thickened liquids only; and to return on a daily basis for IV fluid rehydration.  Also, will prescribe viscous lidocaine for the patient to try as well.  Confirmed the patient is a 30 picked up carafate as well.  Patient was asked to call/return or go directly to the emergency department for any worsening symptoms  whatsoever. ___________________________________________________  Update: Patient presents back to the [facility] today to receive additional IV fluid rehydration.  However, patient states she's feeling much worse today than yesterday.  He continues to feel very weak and dehydrated.  He states he was unable to swallow the viscous lidocaine to see if that would even help his sore throat.  He continues with minimal to no voice.  He appears even more dehydrated today; and required multiple IV sticks to get an IV started.  Vital signs today reveal that patient has a little more tachycardic as well.  Reviewed all with Dr. Julien Nordmann; he recommended the patient be direct admitted to the hospital for further evaluation and management.  Was able to review all with Dr. Cruzita Lederer hospitalist; he is in agreement to admit patient as soon as a bed is available.  Brief history and review of the patient's case was reviewed with hospitalist.  Patient will be direct admitted to [address] oncology unit within the next 30 minutes or so.  Patient will be transported to the direct floor bed via wheelchair.  Per the cancer Center nurse.  CODE STATUS: Patient should be considered a full code; since he has no advance directives in the patient's chart.  Chemotherapy induced neutropenia [facility]) Patient received his first cycle of chemotherapy on 08/19/2016.  WBC is 0.5 and ANC 0.4 today.  Briefly reviewed all neutropenia guidelines with both patient and his family today.  Today-patient was afebrile and vital signs were essentially stable.  Also, patient continues to take Levaquin as previously directed.  Patient appears weak  and fatigued; he does not appear toxic today.    Adenocarcinoma of right lung, stage 4 ([place]) Patient received cycle one of his carboplatin/Alimta/Avastin.  Chemotherapy therapy regimen on 08/19/2016.  I'm not call and on there was a He will return on 08/26/2016 for labs, visit, and additional IV fluid  rehydration.  Also, will return on 09/02/2016 for labs.  Will add a follow-up visit with Dr. Julien Nordmann on 09/02/2016 as well.  Note: Patient is scheduled for a Port-A-Cath placement on 09/01/2016.  Since patient is taking Avastin-he Port-A-Cath has to be placed proximally 8 days.  Note: Before Avastin or approximate 7 days after receiving Avastin.  Note: Dr. Julien Nordmann is requesting consideration that patient undergo a GI consult will he is admitted for further evaluation of the esophagitis versus advancing disease.   Patient stated understanding of all instructions; and was in agreement with this plan of care. The patient knows to call the clinic with any problems, questions or concerns.   Total time spent with patient was 25 minutes;  with greater than 75 percent of that time spent in face to face counseling regarding patient's symptoms,  and coordination of care and follow up.  Disclaimer:This dictation was prepared with Dragon/digital dictation along with [employer]. Any transcriptional errors that result from this process are unintentional.  Drue Second, NP 08/25/2016

## 2016-08-25 NOTE — Assessment & Plan Note (Addendum)
Patient received cycle one of his carboplatin/Alimta/Avastin.  Chemotherapy therapy regimen on 08/19/2016.  I'm not call and on there was a He will return on 08/26/2016 for labs, visit, and additional IV fluid rehydration.  Also, will return on 09/02/2016 for labs.  Will add a follow-up visit with Dr. Julien Nordmann on 09/02/2016 as well.  Note: Patient is scheduled for a Port-A-Cath placement on 09/01/2016.  Since patient is taking Avastin-he Port-A-Cath has to be placed proximally 8 days.  Note: Before Avastin or approximate 7 days after receiving Avastin.  Note: Dr. Julien Nordmann is requesting consideration that patient undergo a GI consult will he is admitted for further evaluation of the esophagitis versus advancing disease.

## 2016-08-25 NOTE — Assessment & Plan Note (Signed)
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  He presented to the [facility] today complaining of significant sore throat and inability to swallow anything orally.  He states that he gets choked drinking even a sip of water.  He feels very dehydrated today.  He is complaining of fatigue and generalized weakness.  He also states he occasionally feels confused as well.  Patient states that he met with his primary care provider this past Friday, 08/20/2016; and was given a Levaquin antibiotic for treatment of any possible infection.  He continues take the Levaquin as directed.  On exam today.  Patient has a trace of fresh 2.  The tongue; but no other obvious oral lesions or erythema of the mouth.  History oropharynx appears clear as well.  Patient appears to be managing all oral secretions with no difficulty; but was noted to have a choking episode when he tried to take a sip of water.  Most likely, this is radiation-induced esophagitis; will hopefully clear soon.  In the meantime-patient was advised to take in thickened liquids only; and to return on a daily basis for IV fluid rehydration.  Also, will prescribe viscous lidocaine for the patient to try as well.  Confirmed the patient is a 30 picked up carafate as well.  Patient was asked to call/return or go directly to the emergency department for any worsening symptoms whatsoever. ___________________________________________________  Update: Patient presents back to the [facility] today to receive additional IV fluid rehydration.  However, patient states she's feeling much worse today than yesterday.  He continues to feel very weak and dehydrated.  He states he was unable to swallow the viscous lidocaine to see if that would even help his sore throat.  He continues with minimal to no voice.  He appears even more dehydrated today; and required multiple IV sticks to get an IV started.  Vital signs today reveal that patient has a  little more tachycardic as well.  Reviewed all with Dr. Julien Nordmann; he recommended the patient be direct admitted to the hospital for further evaluation and management.  Was able to review all with Dr. Cruzita Lederer hospitalist; he is in agreement to admit patient as soon as a bed is available.  Brief history and review of the patient's case was reviewed with hospitalist.  Patient will be direct admitted to [address] oncology unit within the next 30 minutes or so.  Patient will be transported to the direct floor bed via wheelchair.  Per the cancer Center nurse.  CODE STATUS: Patient should be considered a full code; since he has no advance directives in the patient's chart.

## 2016-08-26 ENCOUNTER — Other Ambulatory Visit: Payer: Medicare Other

## 2016-08-26 ENCOUNTER — Ambulatory Visit: Payer: Medicare Other

## 2016-08-26 ENCOUNTER — Ambulatory Visit: Payer: Medicare Other | Admitting: Internal Medicine

## 2016-08-26 DIAGNOSIS — K209 Esophagitis, unspecified: Secondary | ICD-10-CM

## 2016-08-26 DIAGNOSIS — E860 Dehydration: Secondary | ICD-10-CM

## 2016-08-26 LAB — CBC WITH DIFFERENTIAL/PLATELET
BASOS ABS: 0.1 10*3/uL (ref 0.0–0.1)
Basophils Absolute: 0 10*3/uL (ref 0.0–0.1)
Basophils Relative: 2 %
Basophils Relative: 5 %
EOS PCT: 0 %
Eosinophils Absolute: 0 10*3/uL (ref 0.0–0.7)
Eosinophils Absolute: 0 10*3/uL (ref 0.0–0.7)
Eosinophils Relative: 0 %
HCT: 28.8 % — ABNORMAL LOW (ref 39.0–52.0)
HEMATOCRIT: 28.7 % — AB (ref 39.0–52.0)
Hemoglobin: 9.5 g/dL — ABNORMAL LOW (ref 13.0–17.0)
Hemoglobin: 9.5 g/dL — ABNORMAL LOW (ref 13.0–17.0)
LYMPHS ABS: 0.1 10*3/uL — AB (ref 0.7–4.0)
LYMPHS ABS: 0.1 10*3/uL — AB (ref 0.7–4.0)
LYMPHS PCT: 9 %
Lymphocytes Relative: 6 %
MCH: 30.8 pg (ref 26.0–34.0)
MCH: 30.5 pg (ref 26.0–34.0)
MCHC: 33.1 g/dL (ref 30.0–36.0)
MCHC: 33 g/dL (ref 30.0–36.0)
MCV: 93.2 fL (ref 78.0–100.0)
MCV: 92.6 fL (ref 78.0–100.0)
MONO ABS: 0 10*3/uL — AB (ref 0.1–1.0)
MONOS PCT: 5 %
Monocytes Absolute: 0.1 10*3/uL (ref 0.1–1.0)
Monocytes Relative: 4 %
NEUTROS ABS: 1 10*3/uL — AB (ref 1.7–7.7)
Neutro Abs: 1 10*3/uL — ABNORMAL LOW (ref 1.7–7.7)
Neutrophils Relative %: 84 %
Neutrophils Relative %: 85 %
PLATELETS: 65 10*3/uL — AB (ref 150–400)
Platelets: 78 10*3/uL — ABNORMAL LOW (ref 150–400)
RBC: 3.08 MIL/uL — ABNORMAL LOW (ref 4.22–5.81)
RBC: 3.11 MIL/uL — AB (ref 4.22–5.81)
RDW: 15.6 % — AB (ref 11.5–15.5)
RDW: 15.3 % (ref 11.5–15.5)
WBC: 1.2 10*3/uL — CL (ref 4.0–10.5)
WBC: 1.2 10*3/uL — AB (ref 4.0–10.5)

## 2016-08-26 LAB — COMPREHENSIVE METABOLIC PANEL
ALBUMIN: 2.4 g/dL — AB (ref 3.5–5.0)
ALT: 34 U/L (ref 17–63)
AST: 36 U/L (ref 15–41)
Alkaline Phosphatase: 78 U/L (ref 38–126)
Anion gap: 10 (ref 5–15)
BILIRUBIN TOTAL: 1.2 mg/dL (ref 0.3–1.2)
BUN: 28 mg/dL — AB (ref 6–20)
CHLORIDE: 110 mmol/L (ref 101–111)
CO2: 22 mmol/L (ref 22–32)
Calcium: 8.9 mg/dL (ref 8.9–10.3)
Creatinine, Ser: 0.85 mg/dL (ref 0.61–1.24)
GFR calc Af Amer: >60 mL/min (ref >60)
GFR calc non Af Amer: >60 mL/min (ref >60)
GLUCOSE: 102 mg/dL — AB (ref 65–99)
Potassium: 4 mmol/L (ref 3.5–5.1)
SODIUM: 142 mmol/L (ref 135–145)
TOTAL PROTEIN: 5.4 g/dL — AB (ref 6.5–8.1)

## 2016-08-26 NOTE — Progress Notes (Signed)
PT Cancellation Note  Patient Details Name: Warren Odonnell MRN: [id number] DOB: 1946-03-19   Cancelled Treatment:    Reason Eval/Treat Not Completed: Other (comment) (pt's wife stated they were just told pt is "at the end" of life and now is not a good time for PT. She hopes to care for pt at home, stated he walks with a RW and that she'd been assisting him with getting out of bed.  Will check back tomorrow to see if PT is needed for DC planning.  Pt's wife stated pt has been getting weaker at home, he likely would benefit from a wheelchair and possibly a hospital bed.   Blondell Reveal Kistler PT 08/26/2016  [phone]    Philomena Doheny 08/26/2016, 12:02 PM

## 2016-08-26 NOTE — Progress Notes (Signed)
Per [facility] rep pt is active with them for [facility] and [facility]. Will need resumption orders if pt is to continue home health services at discharge. CM will continue to follow. Marney Doctor RN,BSN,NCM [phone]

## 2016-08-26 NOTE — Progress Notes (Signed)
Nutrition Brief Note  Patient identified on the Malnutrition Screening Tool (MST) Report and consulted to discuss nutrition options with patient and family.  Per discussion with RN, pt's wife would like for patient to be comfortable and to go home. Nutrition interventions not warranted at this time and if pt is to go comfort care, nutrition support not within [place].   Wt Readings from Last 15 Encounters:  08/24/16 150 lb 3.2 oz (68.1 kg)  08/10/16 157 lb 1.6 oz (71.3 kg)  08/07/16 162 lb (73.5 kg)  08/02/16 161 lb 12.8 oz (73.4 kg)  07/25/16 168 lb 8 oz (76.4 kg)  07/23/16 171 lb 3.2 oz (77.7 kg)  06/29/16 180 lb (81.6 kg)  06/03/16 180 lb (81.6 kg)  05/09/16 180 lb (81.6 kg)    BMI: 20.9 kg/m^2. Patient meets criteria for normal based on current BMI.   Current diet order is NPO. Labs and medications reviewed.   No nutrition interventions warranted at this time. If nutrition issues arise, please consult RD.   Clayton Bibles, MS, RD, LDN Pager: [phone] After Hours Pager: [phone]

## 2016-08-26 NOTE — Consult Note (Signed)
Consultation Note Date: 08/26/2016   Patient Name: Warren Odonnell  DOB: 24-Aug-1945  MRN: [id number]  Age / Sex: 71 y.o., male  PCP: Shirline Frees, MD Referring Physician: Cristal Ford, DO  Reason for Consultation: Establishing goals of care  HPI/Patient Profile: 71 y.o. male  with past medical history of  Stage IV lung cancer admitted on 08/25/2016 with odynophagia, dysphagia.  A palliative consult has been placed for additional goals of care discussions.   Clinical Assessment and Goals of Care: The patient is resting in bed, he appears weak and tired, he is how ever, awake and alert, fully able to participate in conversations and decision making process.   I introduced myself and palliative care as follows: Palliative medicine is specialized medical care for people living with serious illness. It focuses on providing relief from the symptoms and stress of a serious illness. The goal is to improve quality of life for both the patient and the family.  The patient's biggest concern is his inability to eat or drink, he is not even able to sip on water. We reviewed his condition, prior to this hospitalization, he lost a lot of weight recently, was using a walker, had a fall at home. He now has new moderate R pleural effusion, he has known C7 and T6 vertebral body fractures. He has known lytic lesions in his L pelvis.   We discussed about what is important to the patient and family, given the advanced nature of his illness. See discussions below, thank you for the consult.    NEXT OF KIN  wife Has 2 daughters who live locally.  Has several young grand children.   SUMMARY OF RECOMMENDATIONS/ GOALS OF CARE DISCUSSIONS.   Discussed in detail with patient, wife, daughter, son in law about PEG tube placement, additional chemotherapy, ongoing GI evaluation, with the hope of patient having both life prolongation  coupled with a meaningful quality of life.   Alternatively, also discussed about a more comfort based approach to care, addition of hospice as an extra layer of support, type of care that can be provided at a residential hospice, focus on symptom management such as pain, dyspnea, gentle comfort feedings with clear liquids.   All questions answered to the best of my ability, patient's other daughter is not currently at the bedside, family wishes to discuss amongst themselves for today. They will follow up with Dr Ree Kida or myself in am.   Thank you for the consult.    Code Status/Advance Care Planning:  DNR    Symptom Management:    continue current mode of care for now.   Palliative Prophylaxis:   Bowel Regimen     Psycho-social/Spiritual:   Desire for further Chaplaincy support:no  Additional Recommendations: Caregiving  Support/Resources  Prognosis:   Unable to determine  Discharge Planning: To Be Determined      Primary Diagnoses: Present on Admission: . Adenocarcinoma of right lung, stage 4 ([place]) . Bone metastases ([place]) . Cancer associated pain . Chemotherapy induced neutropenia ([place]) .  Dehydration . Esophagitis . Hypertension   I have reviewed the medical record, interviewed the patient and family, and examined the patient. The following aspects are pertinent.  Past Medical History:  Diagnosis Date  . Adenocarcinoma of right lung, stage 4 ([place] [place]) 07/30/2016  . Bone metastases ([place]) 07/23/2016  . 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 Encounter for antineoplastic chemotherapy 08/10/2016  . Glaucoma   . Goals of care, counseling/discussion 08/10/2016  . Testicular swelling   . Weight loss 07/24/2016   Social History   Social History  . Marital status: Married    Spouse name: N/A  . Number of children: N/A  . Years of education: N/A   Social History Main Topics  . Smoking status: Never Smoker  . Smokeless tobacco: Never Used  . Alcohol use No  . Drug  use: No  . Sexual activity: Not Currently   Other Topics Concern  . None   Social History Narrative  . None   History reviewed. No pertinent family history. Scheduled Meds: . dexamethasone  4 mg Intravenous Q12H  . levofloxacin (LEVAQUIN) IV  500 mg Intravenous Q24H  . pantoprazole (PROTONIX) IV  40 mg Intravenous Q12H  . sucralfate  1 g Oral TID WC & HS   Continuous Infusions: . sodium chloride 100 mL/hr at 08/26/16 1506   PRN Meds:.hydrALAZINE, morphine injection, ondansetron **OR** ondansetron (ZOFRAN) IV Medications Prior to Admission:  Prior to Admission medications   Medication Sig Start Date End Date Taking? Authorizing Provider  amLODipine (NORVASC) 10 MG tablet Take 1 tablet (10 mg total) by mouth daily. 07/28/16  Yes Belkys A Regalado, MD  aspirin 325 MG tablet Take 1 tablet (325 mg total) by mouth daily. Patient taking differently: Take 325 mg by mouth at bedtime.  07/28/16  Yes Belkys A Regalado, MD  atorvastatin (LIPITOR) 20 MG tablet Take 1 tablet (20 mg total) by mouth daily at 6 PM. 07/28/16  Yes Belkys A Regalado, MD  benzonatate (TESSALON) 100 MG capsule Take 100 mg by mouth 3 (three) times daily as needed for cough. 08/02/16  Yes Historical Provider, MD  bimatoprost (LUMIGAN) 0.01 % SOLN Place 1 drop into both eyes at bedtime.   Yes Historical Provider, MD  dexamethasone (DECADRON) 4 MG tablet Take 1 tablet (4 mg total) by mouth 2 (two) times daily. 08/24/16  Yes Susanne Borders, NP  dorzolamide (TRUSOPT) 2 % ophthalmic solution Place 1 drop into both eyes 2 (two) times daily.    Yes Historical Provider, MD  dronabinol (MARINOL) 2.5 MG capsule Take 1 capsule (2.5 mg total) by mouth 2 (two) times daily before lunch and supper. 08/10/16  Yes Curt Bears, MD  folic acid (FOLVITE) 1 MG tablet Take 1 tablet (1 mg total) by mouth daily. 08/10/16  Yes Curt Bears, MD  HYDROcodone-acetaminophen (HYCET) 7.5-325 mg/15 ml solution Take 5-15 mLs by mouth 4 (four) times daily as  needed for moderate pain (5 cc 20 min before meals). 08/23/16  Yes Tyler Pita, MD  HYDROcodone-acetaminophen (NORCO/VICODIN) 5-325 MG tablet Take 1 tablet by mouth 3 (three) times daily as needed for moderate pain.   Yes Historical Provider, MD  levofloxacin (LEVAQUIN) 500 MG tablet Take 500 mg by mouth daily. Started 03/16 for 10 days   Yes Historical Provider, MD  lidocaine (XYLOCAINE) 2 % solution Use as directed 15 mLs in the mouth or throat every 6 (six) hours as needed for mouth pain. 08/24/16  Yes Susanne Borders, NP  morphine (MS CONTIN) 15  MG 12 hr tablet Take 1 tablet (15 mg total) by mouth every 12 (twelve) hours. 08/04/16  Yes Tyler Pita, MD  Multiple Vitamin (MULTIVITAMIN WITH MINERALS) TABS tablet Take 1 tablet by mouth daily with supper.    Yes Historical Provider, MD  Multiple Vitamins-Minerals (PRESERVISION AREDS 2+MULTI VIT) CAPS Take 1 capsule by mouth 2 (two) times daily.   Yes Historical Provider, MD  ondansetron (ZOFRAN) 8 MG tablet Take 8 mg by mouth every 8 (eight) hours as needed for nausea or vomiting.   Yes Historical Provider, MD  polyethylene glycol (MIRALAX / GLYCOLAX) packet Take 17 g by mouth 2 (two) times daily. 07/28/16  Yes Belkys A Regalado, MD  prochlorperazine (COMPAZINE) 10 MG tablet Take 1 tablet by mouth every 6 (six) hours as needed for nausea/vomiting. 07/23/16  Yes Historical Provider, MD  senna-docusate (SENOKOT-S) 8.6-50 MG tablet Take 2 tablets by mouth 2 (two) times daily. 07/28/16  Yes Belkys A Regalado, MD  sucralfate (CARAFATE) 1 g tablet TAKE 1 TABLET BY MOUTH FOUR TIMES DAILY WITH MEALS( 5 MINUTES BEFORE MEALS FOR RADIATION INDLUCED ESOPHAGITIS) AND AT BEDTIME 08/18/16  Yes Tyler Pita, MD  triamcinolone cream (KENALOG) 0.1 % Apply 1 application topically 2 (two) times daily as needed (for rash).    Yes Historical Provider, MD  hydrALAZINE (APRESOLINE) 25 MG tablet Take 1 tablet (25 mg total) by mouth every 8 (eight) hours. Patient not taking:  Reported on 08/24/2016 07/28/16   Elmarie Shiley, MD   No Known Allergies Review of Systems + for generalized pain, + for hoarseness of voice  Physical Exam Well developed, appears with generalized weakness, pale S1 S2 Shallow clear, at times with increased cough, congestion.  Abdomen soft non tender Trace edema Awake, alert Vital Signs: BP 134/83 (BP Location: Left Arm)   Pulse 94   Temp 98.1 F (36.7 C) (Oral)   Resp 16   Ht '5\' 11"'$  (1.803 m)   SpO2 95%  Pain Assessment: 0-10   Pain Score: Asleep   SpO2: SpO2: 95 % O2 Device:SpO2: 95 % O2 Flow Rate: .   IO: Intake/output summary:  Intake/Output Summary (Last 24 hours) at 08/26/16 1528 Last data filed at 08/26/16 0340  Gross per 24 hour  Intake          1156.67 ml  Output                0 ml  Net          1156.67 ml    LBM: Last BM Date: 08/25/16 Baseline Weight:   Most recent weight:       Palliative Assessment/Data:   Flowsheet Rows     Most Recent Value  Intake Tab  Referral Department  Hospitalist  Unit at Time of Referral  Oncology Unit  Palliative Care Primary Diagnosis  Cancer  Palliative Care Type  New Palliative care  Reason for referral  Clarify Goals of Care  Date first seen by Palliative Care  08/26/16  Clinical Assessment  Palliative Performance Scale Score  30%  Pain Max last 24 hours  4  Pain Min Last 24 hours  3  Dyspnea Max Last 24 Hours  4  Dyspnea Min Last 24 hours  3  Psychosocial & Spiritual Assessment  Palliative Care Outcomes  Patient/Family meeting held?  Yes  Who was at the meeting?  patient, wife, daughter, son in law.   Palliative Care Outcomes  Clarified goals of care      Time In:  14.20 Time Out: 1530 Time Total:  70 min  Greater than 50%  of this time was spent counseling and coordinating care related to the above assessment and plan.  Signed by: Loistine Chance, MD  [phone]  Please contact Palliative Medicine Team phone at [phone] for questions and concerns.    For individual provider: See Shea Evans

## 2016-08-26 NOTE — Progress Notes (Signed)
[facility]- Critical lab value, WBC at 1.2. Thanks Phineas Real   On call Baltazar Najjar paged regarding lab calling with critical lab value, WBC 1.2. Charge RN Neil Crouch  took lab value at [phone] this am, called RN, RN paged provider at this time. Will continue to monitor patient.

## 2016-08-26 NOTE — Consult Note (Signed)
[facility] Consultation Note  Referring Provider: Dr. Marzetta Board [facility]) Primary Care Physician:  Shirline Frees, MD Primary Gastroenterologist:  Dr. Teena Irani  Reason for Consultation:  odynophagia  HPI: Warren Odonnell is a 71 y.o. male whom we've been asked to see for odynophagia.  Patient with history of stage IV lung cancer, has received chemotherapy and radiation therapy for the past 2-3 weeks.  About 1 week into radiation, patient has had progressively worsening chest pain and odynophagia.  Unable to tolerate solid foods of liquids without significant pain and vomiting.  Has lost about 30 lbs over the past several months.  Barium swallow attempted yesterday but was unsuccessful due to patients discomfort while swallowing the barium.  Patient denies hematemesis, hematochezia, melena, change in bowel habits.  Patient denies abdominal pain.  Has had colonoscopies with Dr. Amedeo Plenty, unsure if he's had prior endoscopies.   Past Medical History:  Diagnosis Date  . Adenocarcinoma of right lung, stage 4 ([place]) 07/30/2016  . Bone metastases ([place]) 07/23/2016  . 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 Encounter for antineoplastic chemotherapy 08/10/2016  . Glaucoma   . Goals of care, counseling/discussion 08/10/2016  . Testicular swelling   . Weight loss 07/24/2016    Past Surgical History:  Procedure Laterality Date  . HERNIA REPAIR  1984  . testicular torsion Left     Prior to Admission medications   Medication Sig Start Date End Date Taking? Authorizing Provider  amLODipine (NORVASC) 10 MG tablet Take 1 tablet (10 mg total) by mouth daily. 07/28/16  Yes Belkys A Regalado, MD  aspirin 325 MG tablet Take 1 tablet (325 mg total) by mouth daily. Patient taking differently: Take 325 mg by mouth at bedtime.  07/28/16  Yes Belkys A Regalado, MD  atorvastatin (LIPITOR) 20 MG tablet Take 1 tablet (20 mg total) by mouth daily at 6 PM. 07/28/16  Yes Belkys A Regalado, MD  benzonatate (TESSALON)  100 MG capsule Take 100 mg by mouth 3 (three) times daily as needed for cough. 08/02/16  Yes Historical Provider, MD  bimatoprost (LUMIGAN) 0.01 % SOLN Place 1 drop into both eyes at bedtime.   Yes Historical Provider, MD  dexamethasone (DECADRON) 4 MG tablet Take 1 tablet (4 mg total) by mouth 2 (two) times daily. 08/24/16  Yes Susanne Borders, NP  dorzolamide (TRUSOPT) 2 % ophthalmic solution Place 1 drop into both eyes 2 (two) times daily.    Yes Historical Provider, MD  dronabinol (MARINOL) 2.5 MG capsule Take 1 capsule (2.5 mg total) by mouth 2 (two) times daily before lunch and supper. 08/10/16  Yes Curt Bears, MD  folic acid (FOLVITE) 1 MG tablet Take 1 tablet (1 mg total) by mouth daily. 08/10/16  Yes Curt Bears, MD  HYDROcodone-acetaminophen (HYCET) 7.5-325 mg/15 ml solution Take 5-15 mLs by mouth 4 (four) times daily as needed for moderate pain (5 cc 20 min before meals). 08/23/16  Yes Tyler Pita, MD  HYDROcodone-acetaminophen (NORCO/VICODIN) 5-325 MG tablet Take 1 tablet by mouth 3 (three) times daily as needed for moderate pain.   Yes Historical Provider, MD  levofloxacin (LEVAQUIN) 500 MG tablet Take 500 mg by mouth daily. Started 03/16 for 10 days   Yes Historical Provider, MD  lidocaine (XYLOCAINE) 2 % solution Use as directed 15 mLs in the mouth or throat every 6 (six) hours as needed for mouth pain. 08/24/16  Yes Susanne Borders, NP  morphine (MS CONTIN) 15 MG 12 hr tablet Take 1 tablet (15  mg total) by mouth every 12 (twelve) hours. 08/04/16  Yes Tyler Pita, MD  Multiple Vitamin (MULTIVITAMIN WITH MINERALS) TABS tablet Take 1 tablet by mouth daily with supper.    Yes Historical Provider, MD  Multiple Vitamins-Minerals (PRESERVISION AREDS 2+MULTI VIT) CAPS Take 1 capsule by mouth 2 (two) times daily.   Yes Historical Provider, MD  ondansetron (ZOFRAN) 8 MG tablet Take 8 mg by mouth every 8 (eight) hours as needed for nausea or vomiting.   Yes Historical Provider, MD   polyethylene glycol (MIRALAX / GLYCOLAX) packet Take 17 g by mouth 2 (two) times daily. 07/28/16  Yes Belkys A Regalado, MD  prochlorperazine (COMPAZINE) 10 MG tablet Take 1 tablet by mouth every 6 (six) hours as needed for nausea/vomiting. 07/23/16  Yes Historical Provider, MD  senna-docusate (SENOKOT-S) 8.6-50 MG tablet Take 2 tablets by mouth 2 (two) times daily. 07/28/16  Yes Belkys A Regalado, MD  sucralfate (CARAFATE) 1 g tablet TAKE 1 TABLET BY MOUTH FOUR TIMES DAILY WITH MEALS( 5 MINUTES BEFORE MEALS FOR RADIATION INDLUCED ESOPHAGITIS) AND AT BEDTIME 08/18/16  Yes Tyler Pita, MD  triamcinolone cream (KENALOG) 0.1 % Apply 1 application topically 2 (two) times daily as needed (for rash).    Yes Historical Provider, MD  hydrALAZINE (APRESOLINE) 25 MG tablet Take 1 tablet (25 mg total) by mouth every 8 (eight) hours. Patient not taking: Reported on 08/24/2016 07/28/16   Elmarie Shiley, MD    Current Facility-Administered Medications  Medication Dose Route Frequency Provider Last Rate Last Dose  . 0.9 %  sodium chloride infusion   Intravenous Continuous Caren Griffins, MD 100 mL/hr at 08/26/16 0340    . dexamethasone (DECADRON) injection 4 mg  4 mg Intravenous Q12H Caren Griffins, MD   4 mg at 08/26/16 0958  . enoxaparin (LOVENOX) injection 40 mg  40 mg Subcutaneous Q24H Caren Griffins, MD   40 mg at 08/25/16 2100  . hydrALAZINE (APRESOLINE) injection 10 mg  10 mg Intravenous Q6H PRN Costin Karlyne Greenspan, MD      . levofloxacin (LEVAQUIN) IVPB 500 mg  500 mg Intravenous Q24H Caren Griffins, MD   500 mg at 08/25/16 1704  . morphine 4 MG/ML injection 2-4 mg  2-4 mg Intravenous Q3H PRN Caren Griffins, MD   2 mg at 08/25/16 2103  . ondansetron (ZOFRAN) tablet 4 mg  4 mg Oral Q6H PRN Costin Karlyne Greenspan, MD       Or  . ondansetron (ZOFRAN) injection 4 mg  4 mg Intravenous Q6H PRN Costin Karlyne Greenspan, MD      . pantoprazole (PROTONIX) injection 40 mg  40 mg Intravenous Q12H Caren Griffins, MD    40 mg at 08/26/16 0959  . sucralfate (CARAFATE) 1 GM/10ML suspension 1 g  1 g Oral TID WC & HS Costin Karlyne Greenspan, MD   1 g at 08/25/16 1705    Allergies as of 08/25/2016  . (No Known Allergies)    History reviewed. No pertinent family history.  Social History   Social History  . Marital status: Married    Spouse name: N/A  . Number of children: N/A  . Years of education: N/A   Occupational History  . Not on file.   Social History Main Topics  . Smoking status: Never Smoker  . Smokeless tobacco: Never Used  . Alcohol use No  . Drug use: No  . Sexual activity: Not Currently   Other Topics Concern  . Not  on file   Social History Narrative  . No narrative on file    Review of Systems: Positive = bold Gen: Denies any fever, chills, rigors, night sweats, anorexia, fatigue, weakness, malaise, involuntary weight loss, and sleep disorder CV: Denies chest pain, angina, palpitations, syncope, orthopnea, PND, peripheral edema, and claudication. Resp: Denies dyspnea, cough, sputum, wheezing, coughing up blood. GI: Described in detail in HPI.    GU : Denies urinary burning, blood in urine, urinary frequency, urinary hesitancy, nocturnal urination, and urinary incontinence. MS: Denies joint pain or swelling.  Denies muscle weakness, cramps, atrophy.  Derm: Denies rash, itching, oral ulcerations, hives, unhealing ulcers.  Psych: Denies depression, anxiety, memory loss, suicidal ideation, hallucinations,  and confusion. Heme: Denies bruising, bleeding, and enlarged lymph nodes. Neuro:  Denies any headaches, dizziness, paresthesias. Endo:  Denies any problems with DM, thyroid, adrenal function.  Physical Exam: Vital signs in last 24 hours: Temp:  [98 F (36.7 C)-98.7 F (37.1 C)] 98 F (36.7 C) (03/22 0445) Pulse Rate:  [81-105] 92 (03/22 0445) Resp:  [16] 16 (03/22 0445) BP: (95-157)/(67-84) 157/79 (03/22 0445) SpO2:  [90 %-95 %] 94 % (03/22 0445) Last BM Date:  08/25/16 General:   Alert, deconditioned, chronically ill-appearing Head:  Normocephalic and atraumatic. Eyes:  Sclera clear, no icterus.   Conjunctiva pale Ears:  Normal auditory acuity. Nose:  No deformity, discharge,  or lesions. Mouth:  No deformity or lesions.  Oropharynx dry and pale Neck:  Supple; no masses or thyromegaly. Lungs:  Clear throughout to auscultation.   No wheezes, crackles, or rhonchi. No acute distress. Heart:  Regular rate and rhythm; no murmurs, clicks, rubs,  or gallops. Abdomen:  Soft, nontender and nondistended. No masses, hepatosplenomegaly or hernias noted. Normal bowel sounds, without guarding, and without rebound.     Msk:  Symmetrical without gross deformities. Normal posture. Pulses:  Normal pulses noted. Extremities:  Without clubbing or edema. Neurologic:  Alert and  oriented x4; diffusely weak, otherwise grossly normal neurologically. Skin:  Intact without significant lesions or rashes. Psych:  Alert; depressed-appearing, flat affect   Lab Results:  Recent Labs  08/24/16 1415 08/26/16 0341  WBC 0.5* 1.2*  HGB 11.6* 9.5*  HCT 34.3* 28.7*  PLT 171 78*   BMET  Recent Labs  08/24/16 1415 08/26/16 0341  NA 140 142  K 4.9 4.0  CL  --  110  CO2 25 22  GLUCOSE 105 102*  BUN 32.0* 28*  CREATININE 0.9 0.85  CALCIUM 9.8 8.9   LFT  Recent Labs  08/26/16 0341  PROT 5.4*  ALBUMIN 2.4*  AST 36  ALT 34  ALKPHOS 78  BILITOT 1.2   PT/INR No results for input(s): LABPROT, INR in the last 72 hours.  Studies/Results: Dg Esophagus  Result Date: 08/25/2016 CLINICAL DATA:  Dysphagia and painful swallowing for several days, metastatic lung cancer. EXAM: ESOPHOGRAM/BARIUM SWALLOW TECHNIQUE: Single contrast examination was performed using  thin barium. FLUOROSCOPY TIME:  Fluoroscopy Time:  0 minutes 24 seconds Radiation Exposure Index (if provided by the fluoroscopic device): 2.1 mGy Number of Acquired Spot Images: 0 COMPARISON:  None. FINDINGS:  A very limited examination was performed in the recumbent LPO position. Patient was able to manage 2 swallows of barium before requesting terminate of the exam due to pain. There is suboptimal opacification of the esophagus for diagnostic purposes. IMPRESSION: Suboptimal opacification of the esophagus for diagnostic purposes. Please see above. Electronically Signed   By: Lorin Picket M.D.   On: 08/25/2016  15:53   Dg Chest Port 1 View  Result Date: 08/25/2016 CLINICAL DATA:  Lung cancer EXAM: PORTABLE CHEST 1 VIEW COMPARISON:  08/07/2016 FINDINGS: Moderate right pleural effusion has developed. Heterogeneous opacities throughout the right lung have increased. Right hilum is obscured. Heart is stable in appearance. Left lung is clear. IMPRESSION: New moderate right pleural effusion. Increased opacities throughout the right lung. Electronically Signed   By: Marybelle Killings M.D.   On: 08/25/2016 15:14   Impression:  1.  Failure-to-thrive. 2.  Odynophagia.  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 3.  Weight loss; suspect multifactorial (poor oral intake and cancer). 4.  Lung cancer in midst of chemoradiative therapy. 5.  Pancytopenia, chemotherapy-induced.  Plan:  1.  Supportive care:  Sucralfate suspension, PPI. 2.  If can't maintain oral intake after a couple days of inpatient medical management, might consider temporary parenteral nutrition if felt ok/needed by oncology (not good candidate for naso/oroenteric feeding tube, given patient's suspected significant esophagitis). 3.  If no improvement with medical therapy over the next few days and once ANC > 1500, would consider endoscopy to confirm diagnosis of XRT esophagitis, gauge location and severity of such esophagitis, and to rule out other pathology (e.g., CMV or HSV esophagitis). 4.  [facility] will follow.   LOS: 1 day   Shavonn Convey M  08/26/2016, 10:14 AM  Pager [phone] If no answer or after 5 PM call [phone]

## 2016-08-26 NOTE — Progress Notes (Signed)
PROGRESS NOTE    Warren Odonnell  IDP:[id number] DOB: [birth year] DOA: 08/25/2016 PCP: Shirline Frees, MD   No chief complaint on file.   Brief Narrative:  HPI on 08/25/2016 by Dr. Jimmy Odonnell is a 71 y.o. male with medical history significant of stage IV lung cancer with bone mets, prior CVA, currently is getting chemotherapy and radiation therapy, seen by Dr. Earlie Server in office, who presents to the hospital directly from the [facility] due to progressive dysphagia over the last week. Patient tells me that he has been having difficulties swallowing liquids and solids and this has been getting worse over the last week. He has gotten radiation therapy to his C7 as well as a thoracic vertebra finished about a couple weeks ago. He reports that he feels that the food is getting stuck in the middle of his chest and is quite painful when he tries to force food down. He was evaluated in the cancer center yesterday, and had a repeat visit today, and felt like he was significantly dehydrated with inability to have any by mouth intake and he was directed for admission for hydration and further investigation of his dysphagia. He had some choking episodes at home. He was seen by his PCP on 3/16 and was placed on Levaquin out of concern for pneumonia. Currently he denies any fever or chills, he has no abdominal pain, no chest pain. He is also noticed that he has been losing his voice over the same period of time.  Assessment & Plan   Dysphagia/Odynophagia -Consent for radiation-induced esophagitis -Unable to tolerate fluids or solids -Gastroenterology consulted and appreciated, recommended supportive care with sucralfate suspension and PPI. If patient cannot maintain oral intake, consider TPN. Possible endoscopy in a few days if no improvement in symptoms and ANC improves. -Spoke to patient and wife regarding possible peg tube, and both declined) -Will consult nutrition to provide more  information -barium swallow suboptimal study  Stage IV lung cancer/ Goals of care -Currently managed by Dr. Julien Nordmann- who will see the patient -Continue decadron -Spoke with patient and wife regarding goals of care. Patient wishes to be comfortable and wife would like the same. She would like to take him home.  -Explained to patient and wife that I would consult palliative care as well as a nutrition so they could get obtain more information regarding different modes of nutrition and goals of care/pain management.  I explained that they need this information so an informed decision could be made.  -Patient did state he was DNR, and wife did confirm.   ? Pneumonia/ Right pleural effusion -Patient was started on Levaquin by PCP, for a "respiratory infection" -CXR showed moderate right pleural effusion  -Spoke with the patient and wife regarding possible need for thoracentesis. Patient had one last month. Wife hesitant on having another one.  -Will continue to monitor patient's breathing -Continue levaquin for additional day- for completion of antibiotic course  Chemotherapy-induced neutropenia/pancytopenia -Neutropenia improving -Patient was given neupogen -hemoglobin currently 9.5 (drop dilutional ?) -platelets 78 (will discontinue lovenox)  Hypertension -continue IV hydralazine as patient unable to take PO meds  Weight loss/failure to thrive -Patient has been having a 30 pound weight loss in the recent months, he has been having difficulties ambulating at home with a walker -PT consulted  DVT Prophylaxis  SCDs  Code Status: DNR  Family Communication: Wife at bedside  Disposition Plan: Admitted. Pending further recommendations from gastroenterology, palliative care, and oncology.  Consultants Oncology Palliative care Gastroenterology   Procedures  none  Antibiotics   Anti-infectives    Start     Dose/Rate Route Frequency Ordered Stop   08/25/16 1600  levofloxacin  (LEVAQUIN) IVPB 500 mg     500 mg 100 mL/hr over 60 Minutes Intravenous Every 24 hours 08/25/16 1445        Subjective:   [name] seen and examined today.  Patient continues to have pain with swallowing and cough. He feels congested. Patient also complains of feeling weak.   Objective:   Vitals:   08/25/16 1445 08/25/16 2207 08/26/16 0445  BP: 95/82 140/84 (!) 157/79  Pulse: 81 95 92  Resp: '16 16 16  '$ Temp: 98.1 F (36.7 C) 98.7 F (37.1 C) 98 F (36.7 C)  TempSrc: Axillary Axillary Axillary  SpO2: 90% 91% 94%  Height: '5\' 11"'$  (1.803 m)      Intake/Output Summary (Last 24 hours) at 08/26/16 1402 Last data filed at 08/26/16 0340  Gross per 24 hour  Intake          1156.67 ml  Output                0 ml  Net          1156.67 ml   There were no vitals filed for this visit.  Exam  General: Well developed, chronically ill appearing, NAD  HEENT: NCAT,  mucous membranes moist.   Cardiovascular: S1 S2 auscultated, no rubs, murmurs or gallops. Regular rate and rhythm.  Respiratory: Clear to auscultation bilaterally with equal chest rise, +cough  Abdomen: Soft, nontender, nondistended, + bowel sounds  Extremities: warm dry without cyanosis clubbing or edema  Neuro: AAOx3, nonfocal  Psych: Normal affect and demeanor  Data Reviewed: I have personally reviewed following labs and imaging studies  CBC:  Recent Labs Lab 08/24/16 1415 08/26/16 0341  WBC 0.5* 1.2*  NEUTROABS 0.4* 1.0*  HGB 11.6* 9.5*  HCT 34.3* 28.7*  MCV 92.9 93.2  PLT 171 78*   Basic Metabolic Panel:  Recent Labs Lab 08/24/16 1415 08/26/16 0341  NA 140 142  K 4.9 4.0  CL  --  110  CO2 25 22  GLUCOSE 105 102*  BUN 32.0* 28*  CREATININE 0.9 0.85  CALCIUM 9.8 8.9   GFR: Estimated Creatinine Clearance: 79 mL/min (by C-G formula based on SCr of 0.85 mg/dL). Liver Function Tests:  Recent Labs Lab 08/24/16 1415 08/26/16 0341  AST 35* 36  ALT 52 34  ALKPHOS 122 78  BILITOT 0.77  1.2  PROT 6.0* 5.4*  ALBUMIN 2.5* 2.4*   No results for input(s): LIPASE, AMYLASE in the last 168 hours. No results for input(s): AMMONIA in the last 168 hours. Coagulation Profile: No results for input(s): INR, PROTIME in the last 168 hours. Cardiac Enzymes: No results for input(s): CKTOTAL, CKMB, CKMBINDEX, TROPONINI in the last 168 hours. BNP (last 3 results) No results for input(s): PROBNP in the last 8760 hours. HbA1C: No results for input(s): HGBA1C in the last 72 hours. CBG: No results for input(s): GLUCAP in the last 168 hours. Lipid Profile: No results for input(s): CHOL, HDL, LDLCALC, TRIG, CHOLHDL, LDLDIRECT in the last 72 hours. Thyroid Function Tests: No results for input(s): TSH, T4TOTAL, FREET4, T3FREE, THYROIDAB in the last 72 hours. Anemia Panel: No results for input(s): VITAMINB12, FOLATE, FERRITIN, TIBC, IRON, RETICCTPCT in the last 72 hours. Urine analysis:    Component Value Date/Time   COLORURINE YELLOW 05/09/2016 1107  APPEARANCEUR CLEAR 05/09/2016 1107   LABSPEC 1.017 05/09/2016 1107   PHURINE 7.0 05/09/2016 1107   GLUCOSEU NEGATIVE 05/09/2016 1107   HGBUR NEGATIVE 05/09/2016 1107   BILIRUBINUR NEGATIVE 05/09/2016 1107   KETONESUR 15 (A) 05/09/2016 1107   PROTEINUR < 30 08/19/2016 1342   NITRITE NEGATIVE 05/09/2016 1107   LEUKOCYTESUR TRACE (A) 05/09/2016 1107   Sepsis Labs: '@LABRCNTIP'$ (procalcitonin:4,lacticidven:4)  ) Recent Results (from the past 240 hour(s))  TECHNOLOGIST REVIEW     Status: None   Collection Time: 08/24/16  2:15 PM  Result Value Ref Range Status   Technologist Review few helmets  Final      Radiology Studies: Dg Esophagus  Result Date: 08/25/2016 CLINICAL DATA:  Dysphagia and painful swallowing for several days, metastatic lung cancer. EXAM: ESOPHOGRAM/BARIUM SWALLOW TECHNIQUE: Single contrast examination was performed using  thin barium. FLUOROSCOPY TIME:  Fluoroscopy Time:  0 minutes 24 seconds Radiation Exposure Index  (if provided by the fluoroscopic device): 2.1 mGy Number of Acquired Spot Images: 0 COMPARISON:  None. FINDINGS: A very limited examination was performed in the recumbent LPO position. Patient was able to manage 2 swallows of barium before requesting terminate of the exam due to pain. There is suboptimal opacification of the esophagus for diagnostic purposes. IMPRESSION: Suboptimal opacification of the esophagus for diagnostic purposes. Please see above. Electronically Signed   By: Lorin Picket M.D.   On: 08/25/2016 15:53   Dg Chest Port 1 View  Result Date: 08/25/2016 CLINICAL DATA:  Lung cancer EXAM: PORTABLE CHEST 1 VIEW COMPARISON:  08/07/2016 FINDINGS: Moderate right pleural effusion has developed. Heterogeneous opacities throughout the right lung have increased. Right hilum is obscured. Heart is stable in appearance. Left lung is clear. IMPRESSION: New moderate right pleural effusion. Increased opacities throughout the right lung. Electronically Signed   By: Marybelle Killings M.D.   On: 08/25/2016 15:14     Scheduled Meds: . dexamethasone  4 mg Intravenous Q12H  . enoxaparin (LOVENOX) injection  40 mg Subcutaneous Q24H  . levofloxacin (LEVAQUIN) IV  500 mg Intravenous Q24H  . pantoprazole (PROTONIX) IV  40 mg Intravenous Q12H  . sucralfate  1 g Oral TID WC & HS   Continuous Infusions: . sodium chloride 100 mL/hr at 08/26/16 0340     LOS: 1 day   Time Spent in minutes   30 minutes  Ladiamond Gallina D.O. on 08/26/2016 at 2:02 PM  Between 7am to 7pm - Pager - [phone]  After 7pm go to [email or web address] - password [email or web address]  And look for the night coverage person covering for me after hours  [facility] Office  [phone]

## 2016-08-27 ENCOUNTER — Encounter (HOSPITAL_COMMUNITY): Payer: Self-pay | Admitting: General Surgery

## 2016-08-27 DIAGNOSIS — R21 Rash and other nonspecific skin eruption: Secondary | ICD-10-CM

## 2016-08-27 LAB — CBC WITH DIFFERENTIAL/PLATELET
BASOS ABS: 0 10*3/uL (ref 0.0–0.1)
Basophils Relative: 1 %
Eosinophils Absolute: 0 10*3/uL (ref 0.0–0.7)
Eosinophils Relative: 0 %
HEMATOCRIT: 29.9 % — AB (ref 39.0–52.0)
Hemoglobin: 9.8 g/dL — ABNORMAL LOW (ref 13.0–17.0)
LYMPHS ABS: 0.1 10*3/uL — AB (ref 0.7–4.0)
Lymphocytes Relative: 5 %
MCH: 30.8 pg (ref 26.0–34.0)
MCHC: 32.8 g/dL (ref 30.0–36.0)
MCV: 94 fL (ref 78.0–100.0)
MONOS PCT: 7 %
Monocytes Absolute: 0.1 10*3/uL (ref 0.1–1.0)
NEUTROS ABS: 1.3 10*3/uL — AB (ref 1.7–7.7)
Neutrophils Relative %: 87 %
Platelets: 67 10*3/uL — ABNORMAL LOW (ref 150–400)
RBC: 3.18 MIL/uL — ABNORMAL LOW (ref 4.22–5.81)
RDW: 15.5 % (ref 11.5–15.5)
WBC: 1.5 10*3/uL — ABNORMAL LOW (ref 4.0–10.5)

## 2016-08-27 LAB — BASIC METABOLIC PANEL
Anion gap: 11 (ref 5–15)
BUN: 31 mg/dL — ABNORMAL HIGH (ref 6–20)
CO2: 21 mmol/L — ABNORMAL LOW (ref 22–32)
Calcium: 8.9 mg/dL (ref 8.9–10.3)
Chloride: 112 mmol/L — ABNORMAL HIGH (ref 101–111)
Creatinine, Ser: 0.96 mg/dL (ref 0.61–1.24)
GFR calc Af Amer: >60 mL/min (ref >60)
GLUCOSE: 92 mg/dL (ref 65–99)
POTASSIUM: 3.6 mmol/L (ref 3.5–5.1)
Sodium: 144 mmol/L (ref 135–145)

## 2016-08-27 MED ORDER — RISPERIDONE 0.25 MG PO TABS
0.2500 mg | ORAL_TABLET | Freq: Every day | ORAL | Status: DC
  Administered 2016-08-27 – 2016-08-29 (×3): 0.25 mg via ORAL
  Filled 2016-08-27 (×3): qty 1

## 2016-08-27 MED ORDER — FLUCONAZOLE IN SODIUM CHLORIDE 200-0.9 MG/100ML-% IV SOLN
200.0000 mg | Freq: Once | INTRAVENOUS | Status: AC
  Administered 2016-08-27: 200 mg via INTRAVENOUS
  Filled 2016-08-27: qty 100

## 2016-08-27 MED ORDER — HALOPERIDOL LACTATE 5 MG/ML IJ SOLN: 2.0000 mg | Freq: Four times a day (QID) | INTRAMUSCULAR | Status: DC | PRN

## 2016-08-27 MED ORDER — DEXTROSE-NACL 5-0.45 % IV SOLN
INTRAVENOUS | Status: DC
  Administered 2016-08-27 – 2016-08-29 (×3): via INTRAVENOUS

## 2016-08-27 MED ORDER — FLUCONAZOLE IN SODIUM CHLORIDE 100-0.9 MG/50ML-% IV SOLN
100.0000 mg | INTRAVENOUS | Status: DC
  Administered 2016-08-28 – 2016-08-30 (×3): 100 mg via INTRAVENOUS
  Filled 2016-08-27 (×4): qty 50

## 2016-08-27 NOTE — Progress Notes (Signed)
35 minute discussion with patient's wife and daughter concerning his status and options for evaluation and management.  Status is well-summarized by Dr. Inda Castle note, but basically, the family is not ready to "give up" and instead, based on indications from Dr. Earlie Server that there is a fairly good possibility of response to further chemotherapy if we can get the patient over this hump, they would like to continue supportive care.   We discussed options of nutritional support. We are concerned the patient is at risk for aspiration, based on prior attempts at swallowing and his barium swallow study, so we will obtain a speech pathologist evaluation to help clarify the patient's status in this regard, to see if he is currently ready for any form of oral intake. He is asking for ice cream, repeatedly.  Meanwhile, diagnostically, it remains unclear whether the patient could have infectious esophagitis (since he has been neutropenic and has had recurrent rounds of antibiotics, including currently, so he would be at risk for candidiasis), or whether this is all due to radiation. It is hard to sort out the time course between his antibiotics, his radiation treatment, and his chemotherapy associated neutropenia. He did receive a course of Diflucan orally prior to his radiation treatment, according to the patient's wife.  Therefore, I think the best course of action for now is initiation of empiric Diflucan, and the patient's wife is agreeable to this.   On Monday, the patient's white count will probably be better (absolute neutrophil count is currently 1300, which would be adequate but not optimal for upper endoscopic evaluation), so we could consider in more detail the option of endoscopic evaluation at that time, to assess the type and severity of esophagitis that is present. Perhaps by then his swallowing will be improved and endoscopy won't be needed anyway.  Concerning options for nutrition management, we  discussed a PICC line with TNA, nasoenteric feedings, or placement of a temporary gastrostomy tube (most likely by interventional radiology using the "poke" method to minimize trauma to the esophagus). The relative advantages and disadvantages of each course of action were discussed. We agree that a nasoenteric tube, in his case, is probably not realistic because he has repeatedly been pulling out his IV, so he would almost certainly pull out an NG tube. A PICC line would place him at increased risk for intravascular sepsis. Therefore, gastrostomy tube would probably be the best option, once his white count and platelets have improved further. Incidentally, I would favor placement of an abdominal binder over the gastrostomy tube if one is placed, to help protect it from being pulled out by the patient.  Plan:  1. Speech therapy evaluation today 2. I have ordered Diflucan; duration of therapy would depend on patient response 3. On Monday, consider diagnostic endoscopy 4. On Monday, consider gastrostomy tube placement  Cleotis Nipper, M.D. Pager [phone] If no answer or after 5 PM call [phone]

## 2016-08-27 NOTE — Discharge Summary (Signed)
PROGRESS NOTE [facility]   TRYPP HECKMANN   ZOX:[id number] DOB: 1945/08/28  DOA: 08/25/2016 PCP: Shirline Frees, MD   Brief Narrative:  HPI on 08/25/2016 by Dr. Kary Kos D Kinzeris a 71 y.o.malewith medical history significant of stage IV lung cancer with bone mets, prior CVA, currently is getting chemotherapy and radiation therapy, seen by Dr. Earlie Server in office, who presents to the hospital directly from the [facility] due to progressive dysphagia over the last week. Patient tells me that he has been having difficulties swallowing liquids and solids and this has been getting worse over the last week. He has gotten radiation therapy to his C7 as well as a thoracic vertebra finished about a couple weeks ago. He reports that he feels that the food is getting stuck in the middle of his chest and is quite painful when he tries to force food down. He was evaluated in the cancer center yesterday, and had a repeat visit today, and felt like he was significantly dehydrated with inability to have any by mouth intake and he was directed for admission for hydration and further investigation of his dysphagia. He had some choking episodes at home. He was seen by his PCP on 3/16 and was placed on Levaquin out of concern for pneumonia. Currently he denies any fever or chills, he has no abdominal pain, no chest pain. He is also noticed that he has been losing his voice over the same period of time.   Subjective: Patient seen and examined with family at bedside. Patient have no complaints today. He asking for ice cream. Family concern of new rash where IV line was place, feels warm and mild tenderness.   Assessment & Plan: Dysphagia/Odynophagia -Concern for radiation-induced esophagitis -Unable to tolerate fluids or solids -Gastroenterology consulted and appreciated, recommended supportive care with sucralfate suspension and PPI. If patient cannot maintain oral intake, consider TPN.  Possible endoscopy in a few days if no improvement in symptoms and ANC improves. -Given recent abx use also concern for candidal esophagitis - started on Diflucan  -Long discussion regarding nutrition, GI has ordered a swallow eval, which I agree is a good starting point. I explained all the risk of infection with different type of non oral nutrition give this patient neutropenia. PICC line for TPN would not add any benefit, high risk for bacteremia. Family considering PEG tube but with that WBC and platelet at this point is not recommended. Would monitor over the weekend, if WBC and platelet improve  -Keep NPO until swallow eval, may need MSB   Stage IV lung cancer/ Goals of care -Currently managed by Dr. Julien Nordmann -Continue decadron -Goal of care were discussed and family want to give the patient a chance to see if can recover from this acute episode. Dr Julien Nordmann stated to the family that he can have a good response to further chemotherapy if patient recover from the acute illness. Daughter and wife "don't want to give up"  -Patient is DNR, will continue with family support   Pneumonia/ Right pleural effusion -Patient was started on Levaquin by PCP, for a "respiratory infection" -CXR showed new moderate right pleural effusion  -Check procalcitonin - will repeat CXR in 24 hrs to re-check effusion  -Continue to monitor - O2 as needed  -Continue Levaquin   Chemotherapy-induced neutropenia/pancytopenia -Neutropenia improving -Patient was given neupogen -Drop in hgb likely dilutional  -Platelets continue to trend down  -Continue to hold a/c  -Will place in neutropenic precautions, no plants  or flower, no fresh fruits or vegetables in room, avoid sick people in room.   Hypertension BP above goal, likely due to IVF  -Will change IVF to D5 half NS and monitor BP -continue IV hydralazine as patient unable to take PO meds  Weight loss/failure to thrive - related to malignancy  -Patient has  been having a 30 pound weight loss in the recent months, he has been having difficulties ambulating at home with a walker -PT consulted - recommended HH PT   R upper extremity rash - concern for Cellulitis, no meet criteria yet  -Will continue to monitor  -Apply war compresses -If worsen will start abx with better skin coverage   -If spike fever, get blood cultures.   DVT prophylaxis: SCD's  Code Status: DNR  Family Communication: Wife and daughter at bedside  Disposition Plan: TBD, dysphagia workup in progress   Consultants:   GI  Onco   Palliative   IR    Procedures:   Antimicrobials:  Levaquin    Objective: Vitals:   08/26/16 1300 08/26/16 2000 08/26/16 2023 08/27/16 0444  BP: 134/83  (!) 142/72 (!) 153/88  Pulse: 94  71 92  Resp: '16  18 18  '$ Temp: 98.1 F (36.7 C)  98 F (36.7 C) 98.4 F (36.9 C)  TempSrc: Oral  Axillary Axillary  SpO2: 95%  98% 97%  Weight:  68.1 kg (150 lb 3.2 oz)    Height:  '5\' 10"'$  (1.778 m)      Intake/Output Summary (Last 24 hours) at 08/27/16 1634 Last data filed at 08/27/16 0600  Gross per 24 hour  Intake             1300 ml  Output                0 ml  Net             1300 ml   Filed Weights   08/26/16 2000  Weight: 68.1 kg (150 lb 3.2 oz)    Examination:  General exam: NAD HEENT: OP clear  Respiratory system: Clear to auscultation. Transmitted sounds from upper airway, rattles  Cardiovascular system: S1 & S2 heard, RRR. No JVD, murmurs, rubs or gallops Gastrointestinal system: Abdomen is nondistended, soft and nontender. Central nervous system: Alert and oriented. No focal neurological deficits. Extremities: No pedal edema.  Skin: R upper extremity with erythema, warm, non tender to palpation. No edema  Psychiatry: Mood & affect appropriate.    Data Reviewed: I have personally reviewed following labs and imaging studies  CBC:  Recent Labs Lab 08/24/16 1415 08/26/16 0341 08/26/16 1607 08/27/16 0353  WBC  0.5* 1.2* 1.2* 1.5*  NEUTROABS 0.4* 1.0* 1.0* 1.3*  HGB 11.6* 9.5* 9.5* 9.8*  HCT 34.3* 28.7* 28.8* 29.9*  MCV 92.9 93.2 92.6 94.0  PLT 171 78* 65* 67*   Basic Metabolic Panel:  Recent Labs Lab 08/24/16 1415 08/26/16 0341 08/27/16 0353  NA 140 142 144  K 4.9 4.0 3.6  CL  --  110 112*  CO2 25 22 21*  GLUCOSE 105 102* 92  BUN 32.0* 28* 31*  CREATININE 0.9 0.85 0.96  CALCIUM 9.8 8.9 8.9   GFR: Estimated Creatinine Clearance: 70 mL/min (by C-G formula based on SCr of 0.96 mg/dL). Liver Function Tests:  Recent Labs Lab 08/24/16 1415 08/26/16 0341  AST 35* 36  ALT 52 34  ALKPHOS 122 78  BILITOT 0.77 1.2  PROT 6.0* 5.4*  ALBUMIN 2.5* 2.4*  Recent Results (from the past 240 hour(s))  TECHNOLOGIST REVIEW     Status: None   Collection Time: 08/24/16  2:15 PM  Result Value Ref Range Status   Technologist Review few helmets  Final     Radiology Studies: No results found.   Scheduled Meds: . dexamethasone  4 mg Intravenous Q12H  . [START ON 08/28/2016] fluconazole (DIFLUCAN) IV  100 mg Intravenous Q24H  . levofloxacin (LEVAQUIN) IV  500 mg Intravenous Q24H  . pantoprazole (PROTONIX) IV  40 mg Intravenous Q12H  . sucralfate  1 g Oral TID WC & HS   Continuous Infusions: . sodium chloride 100 mL/hr at 08/27/16 1104     LOS: 2 days   Chipper Oman, MD Pager: Text Page via [email or web address]  [phone]  If 7PM-7AM, please contact night-coverage [email or web address] Password [email or web address] 08/27/2016, 4:34 PM

## 2016-08-27 NOTE — Evaluation (Signed)
Clinical/Bedside Swallow Evaluation Patient Details  Name: Warren Odonnell MRN: [id number] Date of Birth: 12/13/1945  Today's Date: 08/27/2016 Time: SLP Start Time (ACUTE ONLY): 2831 SLP Stop Time (ACUTE ONLY): 1620 SLP Time Calculation (min) (ACUTE ONLY): 31 min  Past Medical History:  Past Medical History:  Diagnosis Date  . Adenocarcinoma of right lung, stage 4 ([place]) 07/30/2016  . Bone metastases ([place]) 07/23/2016  . 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 Encounter for antineoplastic chemotherapy 08/10/2016  . Glaucoma   . Goals of care, counseling/discussion 08/10/2016  . Testicular swelling   . Weight loss 07/24/2016   Past Surgical History:  Past Surgical History:  Procedure Laterality Date  . HERNIA REPAIR  1984  . testicular torsion Left    HPI:  71 y.o. male with medical history significant of stage IV lung cancer with bone mets and CVA diagnosed in Feb 2018, currently is getting chemotherapy and radiation therapy and admitted for Dysphagia/Odynophagia. Esophagram attempted but limited due to pt report of pain. He and his daughter report coughing and "choking" with attempt to swallow solids and liquids.    Assessment / Plan / Recommendation Clinical Impression  Pt has oral holding, multiple swallows, and immediate coughing when presnted with even small amounts of water (1/2 tsp or less). Facial grimacing is also noted with each swallow. A chin tuck was attempted with increase in coughing. When given single ice chips and bites of puree, he does not have as many subswallows but he does still have throat clearing followed by delayed coughing. His aspiration risk appears high. Per MD note, medication was started today to treat potential esophagitis. Discussed with pt and daughter about the option of pursuing MBS as he wants to be more agressive with his care. I think it would be beneficial to see if he starts to show some improvements clinically from his medications prior to performing MBS.  For today, would focus heavily on thorough oral care. Immediately following oral care he could have just a few ice chips to allow him to utilize his swallowing musculature since it has been several days since he has had anything PO.  SLP Visit Diagnosis: Dysphagia, unspecified (R13.10)    Aspiration Risk  Severe aspiration risk;Risk for inadequate nutrition/hydration    Diet Recommendation NPO;Ice chips PRN after oral care   Medication Administration: Via alternative means    Other  Recommendations Oral Care Recommendations: Oral care QID;Oral care prior to ice chip/H20 Other Recommendations: Have oral suction available   Follow up Recommendations Home health SLP;24 hour supervision/assistance      Frequency and Duration min 2x/week  2 weeks       Prognosis Prognosis for Safe Diet Advancement: Fair Barriers to Reach Goals: Severity of deficits      Swallow Study   General HPI: 71 y.o. male with medical history significant of stage IV lung cancer with bone mets and CVA diagnosed in Feb 2018, currently is getting chemotherapy and radiation therapy and admitted for Dysphagia/Odynophagia. Esophagram attempted but limited due to pt report of pain. He and his daughter report coughing and "choking" with attempt to swallow solids and liquids.  Type of Study: Bedside Swallow Evaluation Previous Swallow Assessment: none in chart Diet Prior to this Study: NPO Temperature Spikes Noted: No Respiratory Status: Room air History of Recent Intubation: No Behavior/Cognition: Alert;Cooperative;Pleasant mood;Confused;Requires cueing Oral Cavity Assessment: Within Functional Limits Oral Care Completed by SLP: No Oral Cavity - Dentition: Adequate natural dentition Patient Positioning: Upright in bed Baseline Vocal Quality:  Low vocal intensity;Wet Volitional Cough: Weak Volitional Swallow: Able to elicit    Oral/Motor/Sensory Function Overall Oral Motor/Sensory Function: Within functional limits    Ice Chips Ice chips: Impaired Presentation: Spoon Pharyngeal Phase Impairments: Multiple swallows;Cough - Delayed   Thin Liquid Thin Liquid: Impaired Presentation: Spoon Oral Phase Functional Implications: Oral holding Pharyngeal  Phase Impairments: Cough - Immediate;Multiple swallows    Nectar Thick Nectar Thick Liquid: Not tested   Honey Thick Honey Thick Liquid: Not tested   Puree Puree: Impaired Presentation: Spoon Pharyngeal Phase Impairments: Cough - Delayed;Throat Clearing - Immediate   Solid   GO   Solid: Not tested        Germain Osgood 08/27/2016,4:38 PM  Germain Osgood, M.A. CCC-SLP [phone]

## 2016-08-27 NOTE — Evaluation (Signed)
Physical Therapy Evaluation Patient Details Name: Warren Odonnell MRN: [id number] DOB: 1945/07/20 Today's Date: 08/27/2016   History of Present Illness  71 y.o. male with medical history significant of stage IV lung cancer with bone mets and CVA diagnosed in Feb 2018, currently is getting chemotherapy and radiation therapy and admitted for Dysphagia/Odynophagia  Clinical Impression  Pt admitted with above diagnosis. Pt currently with functional limitations due to the deficits listed below (see PT Problem List).  Pt will benefit from skilled PT to increase their independence and safety with mobility to allow discharge to the venue listed below.  Pt assisted with ambulating in hallway short distance.  Pt may benefit from w/c and hospital bed for family to better assist pt in the future.     Follow Up Recommendations Home health PT;Supervision for mobility/OOB    Equipment Recommendations  Wheelchair (measurements PT);Wheelchair cushion (measurements PT);Hospital bed    Recommendations for Other Services       Precautions / Restrictions Precautions Precautions: Fall      Mobility  Bed Mobility Overal bed mobility: Needs Assistance Bed Mobility: Supine to Sit;Sit to Supine     Supine to sit: Min guard;HOB elevated Sit to supine: Min guard   General bed mobility comments: increased time and effort, utilized [employer] and rail  Transfers Overall transfer level: Needs assistance Equipment used: Rolling walker (2 wheeled) Transfers: Sit to/from Stand Sit to Stand: Min assist;From elevated surface         General transfer comment: slight assist to rise and steady, pt felt unable to push up from bed so held RW with both hands, bed elevated  Ambulation/Gait Ambulation/Gait assistance: Min assist Ambulation Distance (Feet): 60 Feet Assistive device: Rolling walker (2 wheeled) Gait Pattern/deviations: Step-through pattern;Decreased stride length     General Gait Details: verbal  cues for RW positioning, distance limited by fatigue  Stairs            Wheelchair Mobility    Modified Rankin (Stroke Patients Only)       Balance Overall balance assessment: Needs assistance         Standing balance support: Bilateral upper extremity supported Standing balance-Leahy Scale: Poor                               Pertinent Vitals/Pain Pain Assessment: No/denies pain    Home Living Family/patient expects to be discharged to:: Private residence Living Arrangements: Spouse/significant other Available Help at Discharge: Family;Available 24 hours/day Type of Home: House Home Access: Stairs to enter   [employer] of Steps: 1 Home Layout: One level Home Equipment: Shower seat - built in;Grab bars - tub/shower;Walker - 2 wheels      Prior Function Level of Independence: Independent with assistive device(s)         Comments: typically uses RW     Hand Dominance        Extremity/Trunk Assessment   Upper Extremity Assessment Upper Extremity Assessment: Generalized weakness    Lower Extremity Assessment Lower Extremity Assessment: Generalized weakness       Communication   Communication: No difficulties  Cognition Arousal/Alertness: Awake/alert Behavior During Therapy: Flat affect Overall Cognitive Status: Within Functional Limits for tasks assessed                                        General Comments  Exercises     Assessment/Plan    PT Assessment Patient needs continued PT services  PT Problem List Decreased strength;Decreased activity tolerance;Decreased mobility       PT Treatment Interventions DME instruction;Gait training;Therapeutic exercise;Therapeutic activities;Patient/family education;Functional mobility training;Wheelchair mobility training    PT Goals (Current goals can be found in the Care Plan section)  Acute Rehab PT Goals PT Goal Formulation: With  patient/family Time For Goal Achievement: 09/10/16 Potential to Achieve Goals: Fair    Frequency Min 3X/week   Barriers to discharge        Co-evaluation               End of Session Equipment Utilized During Treatment: Gait belt Activity Tolerance: Patient tolerated treatment well Patient left: in bed;with call bell/phone within reach;with family/visitor present   PT Visit Diagnosis: Difficulty in walking, not elsewhere classified (R26.2)    Time: 0349-1791 PT Time Calculation (min) (ACUTE ONLY): 13 min   Charges:   PT Evaluation $PT Eval Low Complexity: 1 Procedure     PT G CodesCarmelia Bake, PT, DPT 08/27/2016 Pager: [phone]   York Ram E 08/27/2016, 3:18 PM

## 2016-08-27 NOTE — Consult Note (Signed)
Chief Complaint: esophagitis   Referring Physician:Dr. Cristal Ford  Supervising Physician: Corrie Mckusick  Patient Status: [facility] - In-pt  HPI: Warren Odonnell is an 71 y.o. male with Stage IV lung cancer who is currently undergoing chemotherapy and radiation for this malignancy.  He has been admitted secondary to possible PNA, odynophagia secondary to radiation induced esophagitis, as well as chemotherapy-induced neutropenia.  The family has had multiple discussions with different providers recommending different treatment options regarding feeding for the patient while unable to eat or drink.  We have been asked to see the patient to discuss the possibility of a g-tube.  Past Medical History:  Past Medical History:  Diagnosis Date  . Adenocarcinoma of right lung, stage 4 ([place]) 07/30/2016  . Bone metastases ([place]) 07/23/2016  . 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 Encounter for antineoplastic chemotherapy 08/10/2016  . Glaucoma   . Goals of care, counseling/discussion 08/10/2016  . Testicular swelling   . Weight loss 07/24/2016    Past Surgical History:  Past Surgical History:  Procedure Laterality Date  . HERNIA REPAIR  1984  . testicular torsion Left     Family History: History reviewed. No pertinent family history.  Social History:  reports that he has never smoked. He has never used smokeless tobacco. He reports that he does not drink alcohol or use drugs.  Allergies: No Known Allergies  Medications: Medications reviewed in epic  Please HPI for pertinent positives, otherwise complete 10 system ROS negative, expect weakness, occasional SOB, odynophagia, weight loss.  Pulled IV out 2-3 times overnight  Physical Exam: BP (!) 153/88 (BP Location: Left Arm)   Pulse 92   Temp 98.4 F (36.9 C) (Axillary)   Resp 18   Ht _0  (1.778 m)   Wt 150 lb 3.2 oz (68.1 kg)   SpO2 97%   BMI 21.55 kg/m  Body mass index is 21.55 kg/m. General: frail white male who is laying in bed  in NAD, but very unsteady when ambulating HEENT: head is normocephalic, atraumatic.  Sclera are noninjected.  PERRL.  Ears and nose without any masses or lesions.  Mouth is pink and dry Heart: regular, rate, and rhythm.  Normal s1,s2. No obvious murmurs, gallops, or rubs noted.  Palpable radial and pedal pulses bilaterally Lungs: CTAB with anterior auscultation, no wheezes, rhonchi, or rales noted.  Respiratory effort nonlabored on room air Abd: soft, NT, ND, +BS, no masses, hernias, or organomegaly Psych: Alert and seems oriented, but unable to really speak so difficult to carry a conversation with the patient.   Labs: Results for orders placed or performed during the hospital encounter of 08/25/16 (from the past 48 hour(s))  Comprehensive metabolic panel     Status: Abnormal   Collection Time: 08/26/16  3:41 AM  Result Value Ref Range   Sodium 142 135 - 145 mmol/L   Potassium 4.0 3.5 - 5.1 mmol/L   Chloride 110 101 - 111 mmol/L   CO2 22 22 - 32 mmol/L   Glucose, Bld 102 (H) 65 - 99 mg/dL   BUN 28 (H) 6 - 20 mg/dL   Creatinine, Ser 0.85 0.61 - 1.24 mg/dL   Calcium 8.9 8.9 - 10.3 mg/dL   Total Protein 5.4 (L) 6.5 - 8.1 g/dL   Albumin 2.4 (L) 3.5 - 5.0 g/dL   AST 36 15 - 41 U/L   ALT 34 17 - 63 U/L   Alkaline Phosphatase 78 38 - 126 U/L   Total Bilirubin 1.2  0.3 - 1.2 mg/dL   GFR calc non Af Amer >60 >60 mL/min   GFR calc Af Amer >60 >60 mL/min    Comment: (NOTE) The eGFR has been calculated using the CKD EPI equation. This calculation has not been validated in all clinical situations. eGFR's persistently <60 mL/min signify possible Chronic Kidney Disease.    Anion gap 10 5 - 15  CBC WITH DIFFERENTIAL     Status: Abnormal   Collection Time: 08/26/16  3:41 AM  Result Value Ref Range   WBC 1.2 (LL) 4.0 - 10.5 K/uL    Comment: CRITICAL RESULT CALLED TO, READ BACK BY AND VERIFIED WITH: CHERRY,B. RN _0  ON 3.22.18 BY NMCCOY WHITE COUNT CONFIRMED ON SMEAR    RBC 3.08 (L) 4.22 -  5.81 MIL/uL   Hemoglobin 9.5 (L) 13.0 - 17.0 g/dL   HCT 28.7 (L) 39.0 - 52.0 %   MCV 93.2 78.0 - 100.0 fL   MCH 30.8 26.0 - 34.0 pg   MCHC 33.1 30.0 - 36.0 g/dL   RDW 15.6 (H) 11.5 - 15.5 %   Platelets 78 (L) 150 - 400 K/uL    Comment: REPEATED TO VERIFY SPECIMEN CHECKED FOR CLOTS PLATELET COUNT CONFIRMED BY SMEAR    Neutrophils Relative % 84 %   Lymphocytes Relative 9 %   Monocytes Relative 5 %   Eosinophils Relative 0 %   Basophils Relative 2 %   Neutro Abs 1.0 (L) 1.7 - 7.7 K/uL   Lymphs Abs 0.1 (L) 0.7 - 4.0 K/uL   Monocytes Absolute 0.1 0.1 - 1.0 K/uL   Eosinophils Absolute 0.0 0.0 - 0.7 K/uL   Basophils Absolute 0.0 0.0 - 0.1 K/uL   WBC Morphology TOXIC GRANULATION   CBC with Differential/Platelet     Status: Abnormal   Collection Time: 08/26/16  4:07 PM  Result Value Ref Range   WBC 1.2 (LL) 4.0 - 10.5 K/uL    Comment: REPEATED TO VERIFY CRITICAL VALUE NOTED.  VALUE IS CONSISTENT WITH PREVIOUSLY REPORTED AND CALLED VALUE.    RBC 3.11 (L) 4.22 - 5.81 MIL/uL   Hemoglobin 9.5 (L) 13.0 - 17.0 g/dL   HCT 28.8 (L) 39.0 - 52.0 %   MCV 92.6 78.0 - 100.0 fL   MCH 30.5 26.0 - 34.0 pg   MCHC 33.0 30.0 - 36.0 g/dL   RDW 15.3 11.5 - 15.5 %   Platelets 65 (L) 150 - 400 K/uL    Comment: SPECIMEN CHECKED FOR CLOTS REPEATED TO VERIFY CONSISTENT WITH PREVIOUS RESULT    Neutrophils Relative % 85 %   Lymphocytes Relative 6 %   Monocytes Relative 4 %   Eosinophils Relative 0 %   Basophils Relative 5 %   Neutro Abs 1.0 (L) 1.7 - 7.7 K/uL   Lymphs Abs 0.1 (L) 0.7 - 4.0 K/uL   Monocytes Absolute 0.0 (L) 0.1 - 1.0 K/uL   Eosinophils Absolute 0.0 0.0 - 0.7 K/uL   Basophils Absolute 0.1 0.0 - 0.1 K/uL   WBC Morphology MILD LEFT SHIFT (1-5% METAS, OCC MYELO, OCC BANDS)   Basic metabolic panel     Status: Abnormal   Collection Time: 08/27/16  3:53 AM  Result Value Ref Range   Sodium 144 135 - 145 mmol/L   Potassium 3.6 3.5 - 5.1 mmol/L   Chloride 112 (H) 101 - 111 mmol/L   CO2 21  (L) 22 - 32 mmol/L   Glucose, Bld 92 65 - 99 mg/dL   BUN 31 (H) 6 -  20 mg/dL   Creatinine, Ser 0.96 0.61 - 1.24 mg/dL   Calcium 8.9 8.9 - 10.3 mg/dL   GFR calc non Af Amer >60 >60 mL/min   GFR calc Af Amer >60 >60 mL/min    Comment: (NOTE) The eGFR has been calculated using the CKD EPI equation. This calculation has not been validated in all clinical situations. eGFR's persistently <60 mL/min signify possible Chronic Kidney Disease.    Anion gap 11 5 - 15  CBC with Differential/Platelet     Status: Abnormal   Collection Time: 08/27/16  3:53 AM  Result Value Ref Range   WBC 1.5 (L) 4.0 - 10.5 K/uL   RBC 3.18 (L) 4.22 - 5.81 MIL/uL   Hemoglobin 9.8 (L) 13.0 - 17.0 g/dL   HCT 29.9 (L) 39.0 - 52.0 %   MCV 94.0 78.0 - 100.0 fL   MCH 30.8 26.0 - 34.0 pg   MCHC 32.8 30.0 - 36.0 g/dL   RDW 15.5 11.5 - 15.5 %   Platelets 67 (L) 150 - 400 K/uL    Comment: CONSISTENT WITH PREVIOUS RESULT   Neutrophils Relative % 87 %   Lymphocytes Relative 5 %   Monocytes Relative 7 %   Eosinophils Relative 0 %   Basophils Relative 1 %   Neutro Abs 1.3 (L) 1.7 - 7.7 K/uL   Lymphs Abs 0.1 (L) 0.7 - 4.0 K/uL   Monocytes Absolute 0.1 0.1 - 1.0 K/uL   Eosinophils Absolute 0.0 0.0 - 0.7 K/uL   Basophils Absolute 0.0 0.0 - 0.1 K/uL   WBC Morphology TOXIC GRANULATION     Imaging: Dg Esophagus  Result Date: 08/25/2016 CLINICAL DATA:  Dysphagia and painful swallowing for several days, metastatic lung cancer. EXAM: ESOPHOGRAM/BARIUM SWALLOW TECHNIQUE: Single contrast examination was performed using  thin barium. FLUOROSCOPY TIME:  Fluoroscopy Time:  0 minutes 24 seconds Radiation Exposure Index (if provided by the fluoroscopic device): 2.1 mGy Number of Acquired Spot Images: 0 COMPARISON:  None. FINDINGS: A very limited examination was performed in the recumbent LPO position. Patient was able to manage 2 swallows of barium before requesting terminate of the exam due to pain. There is suboptimal opacification of  the esophagus for diagnostic purposes. IMPRESSION: Suboptimal opacification of the esophagus for diagnostic purposes. Please see above. Electronically Signed   By: Lorin Picket M.D.   On: 08/25/2016 15:53   Dg Chest Port 1 View  Result Date: 08/25/2016 CLINICAL DATA:  Lung cancer EXAM: PORTABLE CHEST 1 VIEW COMPARISON:  08/07/2016 FINDINGS: Moderate right pleural effusion has developed. Heterogeneous opacities throughout the right lung have increased. Right hilum is obscured. Heart is stable in appearance. Left lung is clear. IMPRESSION: New moderate right pleural effusion. Increased opacities throughout the right lung. Electronically Signed   By: Marybelle Killings M.D.   On: 08/25/2016 15:14    Assessment/Plan 1. Odynophagia, likely secondary to radiation-induced esophagitis We have been asked to evaluate the patient for g-tube placement.  Technically, his anatomy would be amenable to this procedure.  However, upon discussion with the family, they states that this is only temporary until his esophagitis improves, 2-3 weeks likely.  I then explained the family that g-tubes, once placed, must remain in place for at least 6-8 weeks prior to removing them.  Generally, g-tube placement is something done for more long-term feeding access and not a short-term solution/option.  GI has recommended temporary parenteral nutrition via nasogastric feeding tube possibly while here in the hospital during acute phase treatment and  then if he is able to start taking in his own nutrition once improving, then further care could be provided by their team regarding endo and other evaluation.  This seems to be a more reasonable option for short term management.    I also discussed the risk of infection with the family.  The patient has chemotherapy-induced neutropenia and this is a procedure that carries risk of infection because the stomach is a "dirty" organ.  There is risk for peritonitis, abscess, or tract infection, etc.   Given his low WBC and inability to ward off infection, he is likely at higher risk for this and or not being able to fight it off if it were to occur.    Lastly, we would also have to follow his platelet count.  It has dropped from 171 to 62 in the last 3 days.  IR likes platelets generally above 50K when doing this type of procedure.  Therefore, we would need to follow his platelets early next week to determine if we could still proceed if the family was agreeable at that time.  After our discussion, the wife states she is not sure that she would want to proceed with a g-tube placement.  She would like to discuss this with her family, etc.  I have informed her that we will check his platelets on Monday, but also check back with them on Monday as well to determine what they have decided.  Thank you for this interesting consult.  I greatly enjoyed meeting Warren Odonnell and look forward to participating in their care.  A copy of this report was sent to the requesting provider on this date.  Electronically Signed: Henreitta Cea 08/27/2016, 9:54 AM   I spent a total of 40 Minutes    in face to face in clinical consultation, greater than 50% of which was counseling/coordinating care for odynophagia

## 2016-08-27 NOTE — Progress Notes (Signed)
CHART NOTE I had a meeting with the patient and his family on 08/26/16. I discussed with them his current condition and treatment options including continued supportive care for the radiation induced esophagitis including IV hydration consideration of PEG tube placement for nutrition temporarily  until improvement of his condition and then resuming his treatment with chemotherapy. I also discussed with them consideration of palliative care and hospice, He has always been interested in treatment but he is so tired and fatigued with poor nutrition from the recent radiation therapy. They are still undecided at this point. We will to monitor and discuss.

## 2016-08-27 NOTE — Progress Notes (Signed)
Daily Progress Note   Patient Name: Warren Odonnell       Date: 08/27/2016 DOB: 01-04-1946  Age: 71 y.o. MRN#: [id number] Attending Physician: Doreatha Lew, MD Primary Care Physician: Shirline Frees, MD Admit Date: 08/25/2016  Reason for Consultation/Follow-up: Establishing goals of care  Subjective:  awake alert Resting in bed wife and daughter at bedside  Appreciate IR and GI input, see below Length of Stay: 2  Current Medications: Scheduled Meds:  . dexamethasone  4 mg Intravenous Q12H  . levofloxacin (LEVAQUIN) IV  500 mg Intravenous Q24H  . pantoprazole (PROTONIX) IV  40 mg Intravenous Q12H  . sucralfate  1 g Oral TID WC & HS    Continuous Infusions: . sodium chloride 100 mL/hr at 08/27/16 0139    PRN Meds: hydrALAZINE, morphine injection, ondansetron **OR** ondansetron (ZOFRAN) IV  Physical Exam         Awake alert NAD Resting in bed Confused overnight, removed his peripheral IV Abdomen soft Trace edema S 1S2  Clear  Vital Signs: BP (!) 153/88 (BP Location: Left Arm)   Pulse 92   Temp 98.4 F (36.9 C) (Axillary)   Resp 18   Ht '5\' 10"'$  (1.778 m)   Wt 68.1 kg (150 lb 3.2 oz)   SpO2 97%   BMI 21.55 kg/m  SpO2: SpO2: 97 % O2 Device: O2 Device: Not Delivered O2 Flow Rate:    Intake/output summary:   Intake/Output Summary (Last 24 hours) at 08/27/16 1042 Last data filed at 08/27/16 0600  Gross per 24 hour  Intake             1300 ml  Output                0 ml  Net             1300 ml   LBM: Last BM Date: 08/26/15 Baseline Weight: Weight: 68.1 kg (150 lb 3.2 oz) Most recent weight: Weight: 68.1 kg (150 lb 3.2 oz)       Palliative Assessment/Data:    Flowsheet Rows     Most Recent Value  Intake Tab  Referral Department  Hospitalist    Unit at Time of Referral  Oncology Unit  Palliative Care Primary Diagnosis  Cancer  Palliative Care Type  New Palliative care  Reason  for referral  Clarify Goals of Care  Date first seen by Palliative Care  08/26/16  Clinical Assessment  Palliative Performance Scale Score  30%  Pain Max last 24 hours  4  Pain Min Last 24 hours  3  Dyspnea Max Last 24 Hours  4  Dyspnea Min Last 24 hours  3  Psychosocial & Spiritual Assessment  Palliative Care Outcomes  Patient/Family meeting held?  Yes  Who was at the meeting?  patient, wife, daughter, son in law.   Palliative Care Outcomes  Clarified goals of care      Patient Active Problem List   Diagnosis Date Noted  . Dysphagia 08/25/2016  . Esophagitis 08/24/2016  . Chemotherapy induced neutropenia ([place]) 08/24/2016  . Dehydration 08/24/2016  . Goals of care, counseling/discussion 08/10/2016  . Encounter for antineoplastic chemotherapy 08/10/2016  . Hypertension 08/10/2016  . Adenocarcinoma of right lung, stage 4 ([place]) 07/30/2016  . Stroke ([place]) 07/25/2016  . Delirium 07/25/2016  . Lacunar stroke ([place])   . 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 Weight loss 07/24/2016  . Bone metastases ([place]) 07/23/2016    Palliative Care Assessment & Plan   Patient Profile:    Assessment:  stage IV lung cancer Esophagitis Dysphagia Odynophagia Bone mets Generalized weakness Recent weight loss   Recommendations/Plan:  Appreciate GI MD Dr Buccini's input and recommendations:  Bed side swallow evaluation today, MBS if indicated.   IV Diflucan  Monitor over weekend, consider options for PEG on Monday.     Patient and family's goals are not comfort-only, they are hopeful for resolution of this acute problem of dysphagia, so that the patient may be able to continue with chemotherapy and have meaningful life prolongation. Agree and endorse their hope for meaningful resolution of the patient's acute problems. We will continue to provide  supportive care.    Code Status:    Code Status Orders        Start     Ordered   08/26/16 1056  Do not attempt resuscitation (DNR)  Continuous    Question Answer Comment  In the event of cardiac or respiratory ARREST Do not call a "code blue"   In the event of cardiac or respiratory ARREST Do not perform Intubation, CPR, defibrillation or ACLS   In the event of cardiac or respiratory ARREST Use medication by any route, position, wound care, and other measures to relive pain and suffering. May use oxygen, suction and manual treatment of airway obstruction as needed for comfort.      08/26/16 1055    Code Status History    Date Active Date Inactive Code Status Order ID Comments User Context   08/25/2016  2:32 PM 08/26/2016 10:55 AM Full Code [id number]  Caren Griffins, MD Inpatient   07/25/2016  2:17 AM 07/28/2016  5:40 PM Full Code [id number]  Edwin Dada, MD Inpatient    Advance Directive Documentation     Most Recent Value  Type of Advance Directive  Healthcare Power of [place], Living will  Pre-existing out of facility DNR order (yellow form or pink MOST form)  -  "MOST" Form in Place?  -       Prognosis:   Unable to determine  Discharge Planning:  To Be Determined  Care plan was discussed with patient wife daughter along with Dr Cristina Gong from GI    Thank you for allowing the Palliative Medicine Team to assist in the care of this patient.   Time In:  9.55 Time  Out: 10.30 Total Time 35 Prolonged Time Billed  no       Greater than 50%  of this time was spent counseling and coordinating care related to the above assessment and plan.  Loistine Chance, MD [phone]  Please contact Palliative Medicine Team phone at [phone] for questions and concerns.

## 2016-08-28 ENCOUNTER — Inpatient Hospital Stay (HOSPITAL_COMMUNITY): Payer: Medicare Other

## 2016-08-28 DIAGNOSIS — J90 Pleural effusion, not elsewhere classified: Secondary | ICD-10-CM

## 2016-08-28 LAB — CBC WITH DIFFERENTIAL/PLATELET
BASOS ABS: 0 10*3/uL (ref 0.0–0.1)
BASOS ABS: 0 10*3/uL (ref 0.0–0.1)
BASOS PCT: 0 %
Basophils Relative: 1 %
EOS ABS: 0 10*3/uL (ref 0.0–0.7)
EOS ABS: 0 10*3/uL (ref 0.0–0.7)
EOS PCT: 0 %
Eosinophils Relative: 0 %
HCT: 24.3 % — ABNORMAL LOW (ref 39.0–52.0)
HCT: 24.9 % — ABNORMAL LOW (ref 39.0–52.0)
HEMOGLOBIN: 8.3 g/dL — AB (ref 13.0–17.0)
HEMOGLOBIN: 8.3 g/dL — AB (ref 13.0–17.0)
LYMPHS ABS: 0.1 10*3/uL — AB (ref 0.7–4.0)
LYMPHS PCT: 5 %
LYMPHS PCT: 9 %
Lymphs Abs: 0.1 10*3/uL — ABNORMAL LOW (ref 0.7–4.0)
MCH: 31.3 pg (ref 26.0–34.0)
MCH: 29.9 pg (ref 26.0–34.0)
MCHC: 34.2 g/dL (ref 30.0–36.0)
MCHC: 33.3 g/dL (ref 30.0–36.0)
MCV: 91.7 fL (ref 78.0–100.0)
MCV: 89.6 fL (ref 78.0–100.0)
Monocytes Absolute: 0.1 10*3/uL (ref 0.1–1.0)
Monocytes Absolute: 0.1 10*3/uL (ref 0.1–1.0)
Monocytes Relative: 12 %
Monocytes Relative: 9 %
NEUTROS PCT: 82 %
Neutro Abs: 0.9 10*3/uL — ABNORMAL LOW (ref 1.7–7.7)
Neutro Abs: 1 10*3/uL — ABNORMAL LOW (ref 1.7–7.7)
Neutrophils Relative %: 82 %
PLATELETS: 38 10*3/uL — AB (ref 150–400)
PLATELETS: 33 10*3/uL — AB (ref 150–400)
RBC: 2.65 MIL/uL — AB (ref 4.22–5.81)
RBC: 2.78 MIL/uL — AB (ref 4.22–5.81)
RDW: 15.4 % (ref 11.5–15.5)
RDW: 15.1 % (ref 11.5–15.5)
WBC: 1.1 10*3/uL — AB (ref 4.0–10.5)
WBC: 1.2 10*3/uL — AB (ref 4.0–10.5)

## 2016-08-28 LAB — PROCALCITONIN: Procalcitonin: <0.1 ng/mL

## 2016-08-28 LAB — BASIC METABOLIC PANEL WITH GFR
Anion gap: 7 (ref 5–15)
BUN: 28 mg/dL — ABNORMAL HIGH (ref 6–20)
CO2: 23 mmol/L (ref 22–32)
Calcium: 8.4 mg/dL — ABNORMAL LOW (ref 8.9–10.3)
Chloride: 112 mmol/L — ABNORMAL HIGH (ref 101–111)
Creatinine, Ser: 0.74 mg/dL (ref 0.61–1.24)
GFR calc Af Amer: >60 mL/min
GFR calc non Af Amer: >60 mL/min
Glucose, Bld: 114 mg/dL — ABNORMAL HIGH (ref 65–99)
Potassium: 3.9 mmol/L (ref 3.5–5.1)
Sodium: 142 mmol/L (ref 135–145)

## 2016-08-28 LAB — OCCULT BLOOD X 1 CARD TO LAB, STOOL: FECAL OCCULT BLD: POSITIVE — AB

## 2016-08-28 MED ORDER — AMLODIPINE BESYLATE 10 MG PO TABS
10.0000 mg | ORAL_TABLET | Freq: Every day | ORAL | Status: DC
  Administered 2016-08-30: 10 mg via ORAL
  Filled 2016-08-28: qty 1

## 2016-08-28 MED ORDER — FENTANYL CITRATE (PF) 100 MCG/2ML IJ SOLN
12.5000 ug | INTRAMUSCULAR | Status: DC | PRN
  Administered 2016-08-28 – 2016-08-30 (×7): 12.5 ug via INTRAVENOUS
  Filled 2016-08-28 (×7): qty 2

## 2016-08-28 MED ORDER — DORZOLAMIDE HCL 2 % OP SOLN
1.0000 [drp] | Freq: Two times a day (BID) | OPHTHALMIC | Status: DC
  Administered 2016-08-28 – 2016-08-30 (×5): 1 [drp] via OPHTHALMIC
  Filled 2016-08-28: qty 10

## 2016-08-28 MED ORDER — METOPROLOL TARTRATE 5 MG/5ML IV SOLN
2.5000 mg | Freq: Three times a day (TID) | INTRAVENOUS | Status: DC
  Administered 2016-08-28 – 2016-08-30 (×7): 2.5 mg via INTRAVENOUS
  Filled 2016-08-28 (×7): qty 5

## 2016-08-28 MED ORDER — ASPIRIN 325 MG PO TABS
325.0000 mg | ORAL_TABLET | Freq: Every day | ORAL | Status: DC
  Administered 2016-08-28 – 2016-08-29 (×2): 325 mg via ORAL
  Filled 2016-08-28 (×2): qty 1

## 2016-08-28 MED ORDER — TBO-FILGRASTIM 300 MCG/0.5ML ~~LOC~~ SOSY
300.0000 ug | PREFILLED_SYRINGE | Freq: Once | SUBCUTANEOUS | Status: AC
  Administered 2016-08-28: 300 ug via SUBCUTANEOUS
  Filled 2016-08-28: qty 0.5

## 2016-08-28 MED ORDER — ATORVASTATIN CALCIUM 20 MG PO TABS: 20.0000 mg | ORAL_TABLET | Freq: Every day | ORAL | Status: DC

## 2016-08-28 MED ORDER — HYDROCODONE-ACETAMINOPHEN 7.5-325 MG/15ML PO SOLN: 5.0000 mL | Freq: Four times a day (QID) | ORAL | Status: DC | PRN

## 2016-08-28 NOTE — Progress Notes (Addendum)
PROGRESS NOTE [facility]   Warren Odonnell   GEX:[id number] DOB: [birth year]  DOA: 08/25/2016 PCP: Shirline Frees, MD   Brief Narrative:  71 year old male with medical history significant for stage IV lung cancer with bone metastasis currently receiving chemotherapy and radiation therapy, recent CVA, hypertension who presented to the hospital directly from the cancer center due to progressive dysphagia over the last week. Patient has been having difficulty swallowing liquids and solid food. Patient was admitted for dysphagia evaluation on IV hydration.   Subjective: Patient seen and examined with wife and daughter at bedside. Patient report swallow slight improved, although have a coughing episode with ICE ship when I was in the room. WBC went down     Assessment & Plan: Dysphagia/Odynophagia -Concern for radiation-induced esophagitis -Gastroenterology consulted and appreciated, recommended supportive care with sucralfate suspension and PPI. Patient may need EGD but with neutropenia at this point this is not feasible.  -Given recent abx use also concern for candidal esophagitis - continue Diflucan  -Long discussion regarding nutrition, GI has ordered a swallow eval, which I agree is a good starting point. I explained all the risk of infection with different type of non oral nutrition give this patient neutropenia. PICC line for TPN would not add any benefit, high risk for bacteremia. Family considering PEG tube but with that WBC and platelet at this point is not recommended.  -Swallow eval was done, high risk for aspiration, unable to perform MBS at this time, see SLP eval.   Chemotherapy-induced pancytopenia, neutropenic -  -Neutropenia worsened today - Could be related to Diflucan, although will continue for now and monitor closely   -Patient was given Neupogen - will give another dose today  -Drop in hgb likely dilutional - Repeat H/H today - transfuse if Hgb less than  7 -Platelets continue to trend down also could have been related to Diflucan, continue to monitor, no signs of bleeding  -Continue to hold a/c   -Continue neutropenic precautions -Monitor CBC   Stage IV lung cancer/ Goals of care -Currently managed by Dr. Julien Nordmann -Pain management changed to Fentanyl, patient getting anxious after morphine. -Continue decadron -Goal of care were discussed and family want to give the patient a chance to see if can recover from this acute episode. Dr Julien Nordmann stated to the family that he can have a good response to further chemotherapy if patient recover from the acute illness. Daughter and wife "don't want to give up"  -Patient is DNR, will continue with family support   Pneumonia/ Right pleural effusion -Patient was started on Levaquin by PCP, for a "respiratory infection" -CXR showed new moderate right pleural effusion - will repeat CXR today  -Check procalcitonin  -Continue to monitor - O2 as needed  -Continue Levaquin for now   Hypertension BP above goal, likely due to IVF and discontinuation of Norvasc  -Will add metoprolol IV given pt NPO  -continue IV hydralazine PRN for SBP > 170 as patient unable to take PO meds -Continue to monitor   Weight loss/failure to thrive - related to malignancy  -Patient has been having a 30 pound weight loss in the recent months, he has been having difficulties ambulating at home with a walker -PT consulted - recommended HH PT   R upper extremity rash - concern for Cellulitis, improved with warm compresses  -Will continue to monitor  -Continue warm compresses -No additional abx needed at this time  -If spike fever, get blood cultures.    DVT prophylaxis:  SCD's  Code Status: DNR  Family Communication: Wife and daughter at bedside  Disposition Plan: TBD, dysphagia workup in progress.  Consultants:   GI  Onco   Palliative   IR    Procedures:   Antimicrobials:  Levaquin    Objective: Vitals:   08/27/16 1500 08/27/16 2033 08/28/16 0415 08/28/16 1300  BP: (!) 148/82 (!) 162/93 (!) 147/79 (!) 134/111  Pulse: 82 86 98   Resp: '18 18 18   '$ Temp: 98 F (36.7 C) 98.5 F (36.9 C) 98 F (36.7 C)   TempSrc: Axillary Axillary Axillary   SpO2: 97% 98% 99%   Weight:      Height:        Intake/Output Summary (Last 24 hours) at 08/28/16 1358 Last data filed at 08/28/16 0600  Gross per 24 hour  Intake           713.33 ml  Output                0 ml  Net           713.33 ml   Filed Weights   08/26/16 2000  Weight: 68.1 kg (150 lb 3.2 oz)    Examination:  General exam: NAD HEENT: OP clear  Respiratory system: Good air entry , diminishes at the R base, coarse/rattle sound transmitted from upper airway.  Cardiovascular system: S1 & S2 heard, RRR. No JVD, murmurs, rubs or gallops Gastrointestinal system: Abdomen is nondistended, soft and nontender.  Central nervous system: Alert and oriented.  Extremities: No LE edema  Skin: R upper extremity erythema improved, not warm or tenderness, multiple bruises  Psychiatry: Mood appropriate    Data Reviewed: I have personally reviewed following labs and imaging studies  CBC:  Recent Labs Lab 08/24/16 1415 08/26/16 0341 08/26/16 1607 08/27/16 0353 08/28/16 0342  WBC 0.5* 1.2* 1.2* 1.5* 1.1*  NEUTROABS 0.4* 1.0* 1.0* 1.3* 0.9*  HGB 11.6* 9.5* 9.5* 9.8* 8.3*  HCT 34.3* 28.7* 28.8* 29.9* 24.3*  MCV 92.9 93.2 92.6 94.0 91.7  PLT 171 78* 65* 67* 38*   Basic Metabolic Panel:  Recent Labs Lab 08/24/16 1415 08/26/16 0341 08/27/16 0353 08/28/16 0342  NA 140 142 144 142  K 4.9 4.0 3.6 3.9  CL  --  110 112* 112*  CO2 25 22 21* 23  GLUCOSE 105 102* 92 114*  BUN 32.0* 28* 31* 28*  CREATININE 0.9 0.85 0.96 0.74  CALCIUM 9.8 8.9 8.9 8.4*   GFR: Estimated Creatinine Clearance: 83.9 mL/min (by C-G formula based on SCr of 0.74 mg/dL). Liver Function Tests:  Recent Labs Lab 08/24/16 1415 08/26/16 0341   AST 35* 36  ALT 52 34  ALKPHOS 122 78  BILITOT 0.77 1.2  PROT 6.0* 5.4*  ALBUMIN 2.5* 2.4*    Recent Results (from the past 240 hour(s))  TECHNOLOGIST REVIEW     Status: None   Collection Time: 08/24/16  2:15 PM  Result Value Ref Range Status   Technologist Review few helmets  Final     Radiology Studies: No results found.   Scheduled Meds: . amLODipine  10 mg Oral Daily  . aspirin  325 mg Oral Daily  . atorvastatin  20 mg Oral q1800  . dexamethasone  4 mg Intravenous Q12H  . dorzolamide  1 drop Both Eyes BID  . fluconazole (DIFLUCAN) IV  100 mg Intravenous Q24H  . levofloxacin (LEVAQUIN) IV  500 mg Intravenous Q24H  . metoprolol  2.5 mg Intravenous Q8H  .  pantoprazole (PROTONIX) IV  40 mg Intravenous Q12H  . risperiDONE  0.25 mg Oral QHS  . sucralfate  1 g Oral TID WC & HS   Continuous Infusions: . dextrose 5 % and 0.45% NaCl 50 mL/hr at 08/27/16 1744     LOS: 3 days    Chipper Oman, MD Pager: Text Page via [email or web address]  [phone]  If 7PM-7AM, please contact night-coverage [email or web address] Password [email or web address] 08/28/2016, 1:58 PM

## 2016-08-28 NOTE — Progress Notes (Addendum)
  Speech Language Pathology Treatment: Dysphagia  Patient Details Name: Warren Odonnell MRN: [id number] DOB: 09-09-1945 Today's Date: 08/28/2016 Time: 7124-5809 SLP Time Calculation (min) (ACUTE ONLY): 18 min  Assessment / Plan / Recommendation Clinical Impression  Dysphagia treatment provided for PO trials. Pt reports feeling that swallow has improved today. Pt clearing throat prior to any PO trials, concerning for decreased management of secretions. Provided trials of ice chips, teaspoon sips of thin liquid, and small amounts of puree, all of which resulted in throat clear shortly after swallow and multiple swallows. Consuming even these small amounts of PO trials appeared laborious for the pt. This morning the pt reportedly had a coughing episode with ice chips. Reviewed findings and provided education to pt and spouse at bedside. Pt would benefit from an objective evaluation of swallow function at some point; unfortunately with the s/s at bedside and with how much effort it takes for the pt to consume even small amounts of PO trials (pt declined any further trials after about 2 attempts of each consistency), an MBS on this date would likely not provide sufficient information for diet recommendations. Recommend that pt remain NPO with meds via alternative means; will continue to follow for continued PO trials and hopefully eventual MBS.    HPI HPI: 71 y.o. male with medical history significant of stage IV lung cancer with bone mets and CVA diagnosed in Feb 2018, currently is getting chemotherapy and radiation therapy and admitted for Dysphagia/Odynophagia. Esophagram attempted but limited due to pt report of pain. He and his daughter report coughing and "choking" with attempt to swallow solids and liquids.       SLP Plan  Continue with current plan of care       Recommendations  Diet recommendations: NPO Medication Administration: Via alternative means Postural Changes and/or Swallow  Maneuvers: Seated upright 90 degrees (for ice chips)                Oral Care Recommendations: Oral care QID;Oral care prior to ice chip/H20 Follow up Recommendations: Home health SLP;24 hour supervision/assistance SLP Visit Diagnosis: Dysphagia, unspecified (R13.10) Plan: Continue with current plan of care       [place], [place], [place] 08/28/2016, 1:13 PM [phone]

## 2016-08-28 NOTE — Progress Notes (Signed)
[facility] Gastroenterology Progress Note  Warren Odonnell 71 y.o. 06-28-1945   Subjective: Resting in bed. Coughing with trial of ice chips per nursing this morning. Wife and daughter at bedside.  Objective: Vital signs in last 24 hours: Vitals:   08/27/16 2033 08/28/16 0415  BP: (!) 162/93 (!) 147/79  Pulse: 86 98  Resp: 18 18  Temp: 98.5 F (36.9 C) 98 F (36.7 C)    Physical Exam: Gen: lethargic, elderly, no acute distress, thin HEENT: anicteric sclera CV: RRR Chest: Coarse breath sounds anteriorly Abd: soft, nontender, nondistended, +BS  Lab Results:  Recent Labs  08/27/16 0353 08/28/16 0342  NA 144 142  K 3.6 3.9  CL 112* 112*  CO2 21* 23  GLUCOSE 92 114*  BUN 31* 28*  CREATININE 0.96 0.74  CALCIUM 8.9 8.4*    Recent Labs  08/26/16 0341  AST 36  ALT 34  ALKPHOS 78  BILITOT 1.2  PROT 5.4*  ALBUMIN 2.4*    Recent Labs  08/27/16 0353 08/28/16 0342  WBC 1.5* 1.1*  NEUTROABS 1.3* 0.9*  HGB 9.8* 8.3*  HCT 29.9* 24.3*  MCV 94.0 91.7  PLT 67* 38*   No results for input(s): LABPROT, INR in the last 72 hours.    Assessment/Plan: Metastatic lung cancer with odynophagia in the setting of chemo/radiation with neutropenia. Failed bedside swallowing evaluation. Carafate on hold due to inability to take POs. On empiric IV Diflucan. Would hold off on an EGD due to his neutropenia and see if the IV Diflucan improves his odynophagia. Hold off on additional ice chips until modified barium swallow due to coughing that he experienced today. If he fails to show signs of improvement over the next 2-3 days, then he will need a PEG tube placed by IR when his WBC/platelet count improves. I think his chemotherapy is the main source of his leucopenia although if it continues to fall then may need to d/c the Diflucan but would continue at this time. Continue supportive care. [facility] will revisit Monday.    Cary C. 08/28/2016, 11:16 AM   Pager  [phone]  AFTER 5 pm or on weekends call [phone] ID: Warren Odonnell, male   DOB: 1945/12/04, 71 y.o.   MRN: [id number]

## 2016-08-29 LAB — CBC WITH DIFFERENTIAL/PLATELET
BASOS PCT: 0 %
Basophils Absolute: 0 10*3/uL (ref 0.0–0.1)
EOS ABS: 0 10*3/uL (ref 0.0–0.7)
Eosinophils Relative: 0 %
HCT: 26.4 % — ABNORMAL LOW (ref 39.0–52.0)
Hemoglobin: 9.1 g/dL — ABNORMAL LOW (ref 13.0–17.0)
LYMPHS ABS: 0.2 10*3/uL — AB (ref 0.7–4.0)
LYMPHS PCT: 12 %
MCH: 31.3 pg (ref 26.0–34.0)
MCHC: 34.5 g/dL (ref 30.0–36.0)
MCV: 90.7 fL (ref 78.0–100.0)
Monocytes Absolute: 0.1 10*3/uL (ref 0.1–1.0)
Monocytes Relative: 9 %
NEUTROS ABS: 1.1 10*3/uL — AB (ref 1.7–7.7)
Neutrophils Relative %: 79 %
PLATELETS: 33 10*3/uL — AB (ref 150–400)
RBC: 2.91 MIL/uL — ABNORMAL LOW (ref 4.22–5.81)
RDW: 15.1 % (ref 11.5–15.5)
WBC: 1.4 10*3/uL — CL (ref 4.0–10.5)

## 2016-08-29 LAB — COMPREHENSIVE METABOLIC PANEL
ALK PHOS: 76 U/L (ref 38–126)
ALT: 28 U/L (ref 17–63)
AST: 35 U/L (ref 15–41)
Albumin: 2.6 g/dL — ABNORMAL LOW (ref 3.5–5.0)
Anion gap: 7 (ref 5–15)
BUN: 25 mg/dL — ABNORMAL HIGH (ref 6–20)
CALCIUM: 8.6 mg/dL — AB (ref 8.9–10.3)
CHLORIDE: 111 mmol/L (ref 101–111)
CO2: 23 mmol/L (ref 22–32)
CREATININE: 0.75 mg/dL (ref 0.61–1.24)
Glucose, Bld: 112 mg/dL — ABNORMAL HIGH (ref 65–99)
Potassium: 3.5 mmol/L (ref 3.5–5.1)
Sodium: 141 mmol/L (ref 135–145)
Total Bilirubin: 1.1 mg/dL (ref 0.3–1.2)
Total Protein: 5.3 g/dL — ABNORMAL LOW (ref 6.5–8.1)

## 2016-08-29 MED ORDER — TBO-FILGRASTIM 300 MCG/0.5ML ~~LOC~~ SOSY
300.0000 ug | PREFILLED_SYRINGE | Freq: Every day | SUBCUTANEOUS | Status: DC
  Administered 2016-08-29: 300 ug via SUBCUTANEOUS
  Filled 2016-08-29: qty 0.5

## 2016-08-29 MED ORDER — DEXAMETHASONE SODIUM PHOSPHATE 10 MG/ML IJ SOLN
2.0000 mg | Freq: Two times a day (BID) | INTRAMUSCULAR | Status: DC
  Administered 2016-08-29 – 2016-08-30 (×2): 2 mg via INTRAVENOUS
  Filled 2016-08-29 (×2): qty 1

## 2016-08-29 NOTE — Progress Notes (Signed)
[facility]  I have a long discussion with wife spent 35 minutes discussing the goals of care.  Patient in not making much improvement with his pancytopenia, which continues to put patient at risk for infections and bleeding. WBC improve slightly with Neupogen yesterday.   Patient feel and report his swallowing is slight better hopefully a MBS can be done tomorrow.   Patient continues with poor prognosis. With his WBC and platelets would not recommend any type of invasive procedure. I continue to recommend again TPN given high risk for bacteremia. Wife understand that patient is not doing well and would like to have an interdiciplinary meeting with the family.   Patient talk to me with his wife out of the room, and want to try what ever is safe for him and doesn't make him suffer. He told me that he doesn't want to see his family suffer and wishes for everyone to be on the same page. Meeting will be arrange for Monday 11:00 with palliative care. Will try to get Dr Earlie Server to participate.   I recommended that for today will, start treatment for chemo induce neutropenia with Neupogen daily. Continue to monitor his swallow. Continue Diflucan and monitor. Hopefully a modify swallow can be done tomorrow. Provided support to family and patient. Check CBC in AM. Patient also having some restlessness will decrease steroid may be this is steroid induce.   Chipper Oman, MD

## 2016-08-29 NOTE — Evaluation (Signed)
Clinical/Bedside Swallow Evaluation Patient Details  Name: Warren Odonnell MRN: [id number] Date of Birth: [birth year]  Today's Date: 08/29/2016 Time: SLP Start Time (ACUTE ONLY): 3154 SLP Stop Time (ACUTE ONLY): 1529 SLP Time Calculation (min) (ACUTE ONLY): 15 min  Past Medical History:  Past Medical History:  Diagnosis Date  . Adenocarcinoma of right lung, stage 4 ([place]) 07/30/2016  . Bone metastases ([place]) 07/23/2016  . 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 Encounter for antineoplastic chemotherapy 08/10/2016  . Glaucoma   . Goals of care, counseling/discussion 08/10/2016  . Testicular swelling   . Weight loss 07/24/2016   Past Surgical History:  Past Surgical History:  Procedure Laterality Date  . HERNIA REPAIR  1984  . testicular torsion Left    HPI:  71 y.o. male with medical history significant of stage IV lung cancer with bone mets and CVA diagnosed in Feb 2018, currently is getting chemotherapy and radiation therapy and admitted for Dysphagia/Odynophagia. Esophagram attempted but limited due to pt report of pain. He and his daughter report coughing and "choking" with attempt to swallow solids and liquids.    Assessment / Plan / Recommendation Clinical Impression  Repeat clinical swallowing evaluation was completed using thin liquids and pureed material.  Oral mechanism exam was completed and unremarkable.  The patient presented with what appeared to be a pharyngeal dysphagia characterized by a delayed swallow trigger with delayed throat clear and/or cough for all boluses.  Multiple swallows also seen across all boluses with patient stating that sometimes he feels that material sticks in his throat.  He is more agreeable to MBS to determine current swallowing physiology and least restrictive diet.  Will order for tomorrow.   SLP Visit Diagnosis: Dysphagia, unspecified (R13.10)    Aspiration Risk  Severe aspiration risk;Risk for inadequate nutrition/hydration    Diet Recommendation    NPO pending MBS tomorrow.    Medication Administration: Via alternative means    Other  Recommendations Oral Care Recommendations: Oral care QID Other Recommendations: Have oral suction available   Follow up Recommendations Home health SLP;24 hour supervision/assistance      Frequency and Duration min 2x/week  2 weeks       Prognosis Prognosis for Safe Diet Advancement: Fair Barriers to Reach Goals: Severity of deficits      Swallow Study   General Date of Onset: 08/25/16 HPI: 71 y.o. male with medical history significant of stage IV lung cancer with bone mets and CVA diagnosed in Feb 2018, currently is getting chemotherapy and radiation therapy and admitted for Dysphagia/Odynophagia. Esophagram attempted but limited due to pt report of pain. He and his daughter report coughing and "choking" with attempt to swallow solids and liquids.  Type of Study: Bedside Swallow Evaluation Previous Swallow Assessment: Swallow assessments during this admission.   Diet Prior to this Study: NPO Temperature Spikes Noted: No Respiratory Status: Room air History of Recent Intubation: No Behavior/Cognition: Alert;Cooperative;Pleasant mood;Requires cueing;Distractible Oral Cavity Assessment: Dry Oral Care Completed by SLP: No Oral Cavity - Dentition: Adequate natural dentition Vision: Functional for self-feeding Self-Feeding Abilities: Able to feed self Patient Positioning: Partially reclined (secondary to back pain) Baseline Vocal Quality: Low vocal intensity;Wet Volitional Cough: Weak Volitional Swallow: Able to elicit    Oral/Motor/Sensory Function Overall Oral Motor/Sensory Function: Within functional limits   Ice Chips Ice chips: Not tested   Thin Liquid Thin Liquid: Impaired Presentation: Spoon;Cup Pharyngeal  Phase Impairments: Suspected delayed Swallow;Multiple swallows;Throat Clearing - Delayed    Nectar Thick Nectar Thick Liquid: Not tested  Honey Thick Honey Thick Liquid: Not  tested   Puree Puree: Impaired Presentation: Spoon Pharyngeal Phase Impairments: Suspected delayed Swallow;Throat Clearing - Delayed;Multiple swallows   Solid   GO   Solid: Not tested       Shelly Flatten, MA, CCC-SLP Acute Rehab SLP [phone] Lamar Sprinkles 08/29/2016,3:39 PM

## 2016-08-29 NOTE — Progress Notes (Signed)
No charge note.   Events over the weekend noted, patient remains NPO, speech evaluation noted and appreciated. GI note reviewed.  Discussed with wife and Dr Quincy Simmonds outside the patient's room this am. Wife is concerned that the patient continues to decline, he has ongoing weakness, remains NPO. Wife states that the 2 daughters are struggling with the patient's ongoing rapid decline. Wife is not sure whether the patient can recover from this acute condition, additionally, she is not sure patient ought to received further chemotherapy.   They wish to discus further with oncology. Additionally, family is requesting for a family meeting on 08-30-16 at 36 AM with palliative and hospitalist service to discuss further with patient, wife and 2 daughters.   We will follow up.  15 minutes spent.   Loistine Chance MD [facility] health palliative medicine team [phone]

## 2016-08-30 ENCOUNTER — Inpatient Hospital Stay (HOSPITAL_COMMUNITY): Payer: Medicare Other

## 2016-08-30 LAB — CBC WITH DIFFERENTIAL/PLATELET
BASOS ABS: 0 10*3/uL (ref 0.0–0.1)
Basophils Relative: 1 %
Eosinophils Absolute: 0 10*3/uL (ref 0.0–0.7)
Eosinophils Relative: 0 %
HCT: 25.7 % — ABNORMAL LOW (ref 39.0–52.0)
Hemoglobin: 8.8 g/dL — ABNORMAL LOW (ref 13.0–17.0)
LYMPHS ABS: 0.2 10*3/uL — AB (ref 0.7–4.0)
Lymphocytes Relative: 11 %
MCH: 30.9 pg (ref 26.0–34.0)
MCHC: 34.2 g/dL (ref 30.0–36.0)
MCV: 90.2 fL (ref 78.0–100.0)
MONO ABS: 0.2 10*3/uL (ref 0.1–1.0)
Monocytes Relative: 10 %
Neutro Abs: 1.5 10*3/uL — ABNORMAL LOW (ref 1.7–7.7)
Neutrophils Relative %: 78 %
Platelets: 22 10*3/uL — CL (ref 150–400)
RBC: 2.85 MIL/uL — ABNORMAL LOW (ref 4.22–5.81)
RDW: 15 % (ref 11.5–15.5)
WBC: 1.9 10*3/uL — AB (ref 4.0–10.5)

## 2016-08-30 LAB — PROCALCITONIN

## 2016-08-30 MED ORDER — LORAZEPAM 2 MG/ML IJ SOLN: 1.0000 mg | INTRAMUSCULAR | 0 refills | Status: AC | PRN

## 2016-08-30 MED ORDER — LORAZEPAM 2 MG/ML IJ SOLN: 1.0000 mg | INTRAMUSCULAR | Status: DC | PRN

## 2016-08-30 MED ORDER — RESOURCE THICKENUP CLEAR PO POWD: ORAL | Status: AC

## 2016-08-30 MED ORDER — FENTANYL CITRATE (PF) 100 MCG/2ML IJ SOLN: 12.5000 ug | INTRAMUSCULAR | 0 refills | Status: AC | PRN

## 2016-08-30 MED ORDER — METOPROLOL TARTRATE 5 MG/5ML IV SOLN: 2.5000 mg | Freq: Three times a day (TID) | INTRAVENOUS | Status: AC

## 2016-08-30 MED ORDER — RESOURCE THICKENUP CLEAR PO POWD
ORAL | Status: DC | PRN
  Filled 2016-08-30: qty 125

## 2016-08-30 MED ORDER — SODIUM CHLORIDE 0.9 % IV SOLN
Freq: Once | INTRAVENOUS | Status: AC
  Administered 2016-08-30: 10:00:00 via INTRAVENOUS

## 2016-08-30 MED ORDER — HALOPERIDOL LACTATE 5 MG/ML IJ SOLN: 2.0000 mg | Freq: Four times a day (QID) | INTRAMUSCULAR | Status: AC | PRN

## 2016-08-30 MED ORDER — DEXAMETHASONE SODIUM PHOSPHATE 10 MG/ML IJ SOLN: 2.0000 mg | Freq: Two times a day (BID) | INTRAMUSCULAR | 0 refills | Status: AC

## 2016-08-30 MED ORDER — LIDOCAINE VISCOUS 2 % MT SOLN
15.0000 mL | OROMUCOSAL | Status: DC | PRN
  Filled 2016-08-30: qty 15

## 2016-08-30 NOTE — Discharge Summary (Addendum)
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 Patient has been having difficulty swallowing liquids and solid food. Patient was admitted for dysphagia evaluation on IV hydration.   Subjective: Had family meeting with palliative care. Family has opted for comfort care measures, as patient is not making any improvement. Patient wishes was not to suffer. So family in agreement for hospice.   Discharge Diagnoses/Hospital Course:  Dysphagia/Odynophagia - Now for comfort care measures  -Family opted to stop all unnecessary treatment they don't wish to pursue any invasive procedures.  -Concernfor radiation-induced esophagitis -Gastroenterology consulted and appreciated, recommended supportive care with sucralfate suspension and PPI. Patient needed EGD but with neutropenia at this point this is not feasible.  -Given recent abx use also concern for candidal esophagitis - Was treated with diflucan  -MBS was perform patient can have comfort feeds   Chemotherapy/Radiotherapy-induced pancytopenia, neutropenic -  -Neutropenia slowly improving in the past 2 day with the addition of Neupogen  -Patient for comfort measures, stop Neupogen, I recommend to continue Protective precautions to avoid infection while in comfort care.   Stage IV lung cancer -Comfort care with hospice  -Pain management   Pneumonia/ Right pleural  effusion -Patient completed a course of Levaquin   Hypertension BP above goal  -Will continue Metoprolol for now    Weight loss/failure to thrive - related to malignancy  -Comfort feeds  R upper extremity rash- Resolved   Discharge Instructions  You were cared for by a hospitalist during your hospital stay. If you have any questions about your discharge medications or the care you received while you were in the hospital after you are discharged, you can call the unit and asked to speak with the hospitalist on call if the hospitalist that took care of you is not available. Once you are discharged, your primary care physician will handle any further medical issues. Please note that NO REFILLS for any discharge medications will be authorized once you are discharged, as it is imperative that you return to your primary care physician (or establish a relationship with a primary care physician if you do not have one) for your aftercare needs so that they can reassess your need for medications and monitor your lab values.  Discharge Instructions    Call MD for:  difficulty breathing, headache or visual disturbances    Complete by:  As directed    Call MD for:  extreme fatigue    Complete by:  As directed    Call MD for:  hives    Complete by:  As directed    Call MD for:  persistant dizziness or light-headedness    Complete by:  As directed    Call MD for:  persistant nausea and vomiting    Complete by:  As directed    Call MD for:  redness, tenderness, or signs of infection (pain, swelling, redness, odor or green/yellow discharge around incision site)    Complete by:  As directed    Call MD for:  severe uncontrolled pain    Complete by:  As directed    Call MD for:  temperature >100.4    Complete by:  As directed    Diet general    Complete by:  As directed      Allergies as of 08/30/2016   No Known Allergies     Medication List    STOP taking these medications   amLODipine 10  MG tablet Commonly known as:  NORVASC   aspirin 325 MG tablet   atorvastatin 20 MG tablet Commonly known as:  LIPITOR   benzonatate 100 MG capsule Commonly known as:  TESSALON   dexamethasone 4 MG tablet Commonly known as:  DECADRON   folic acid 1 MG tablet Commonly known as:  FOLVITE   hydrALAZINE 25 MG tablet Commonly known as:  APRESOLINE   HYDROcodone-acetaminophen 5-325 MG tablet Commonly known as:  NORCO/VICODIN   HYDROcodone-acetaminophen 7.5-325 mg/15 ml solution Commonly known as:  HYCET   levofloxacin 500 MG tablet Commonly known as:  LEVAQUIN   lidocaine 2 % solution Commonly known as:  XYLOCAINE   multivitamin with minerals Tabs tablet   polyethylene glycol packet Commonly known as:  MIRALAX / GLYCOLAX   PRESERVISION AREDS 2+MULTI VIT Caps   prochlorperazine 10 MG tablet Commonly known as:  COMPAZINE   triamcinolone cream 0.1 % Commonly known as:  KENALOG     TAKE these medications   bimatoprost 0.01 % Soln Commonly known as:  LUMIGAN Place 1 drop into both eyes at bedtime.   dexamethasone 10 MG/ML injection Commonly known as:  DECADRON Inject 0.2 mLs (2 mg total) into the vein every 12 (twelve) hours.   dorzolamide 2 % ophthalmic solution Commonly known as:  TRUSOPT Place 1 drop into both eyes 2 (two) times daily.   dronabinol 2.5 MG capsule Commonly known as:  MARINOL Take 1 capsule (2.5 mg total) by mouth 2 (two) times daily before lunch and supper.   fentaNYL 100 MCG/2ML injection Commonly known as:  SUBLIMAZE Inject 0.25 mLs (12.5 mcg total) into the vein every 2 (two) hours as needed for moderate pain or severe pain.   haloperidol lactate 5 MG/ML injection Commonly known as:  HALDOL Inject 0.4 mLs (2 mg total) into the vein every 6 (six) hours as needed.   LORazepam 2 MG/ML injection Commonly known as:  ATIVAN Inject 0.5 mLs (1 mg total) into the vein every 4 (four) hours as needed for anxiety.   metoprolol 5 MG/5ML Soln  injection Commonly known as:  LOPRESSOR Inject 2.5 mLs (2.5 mg total) into the vein every 8 (eight) hours.   morphine 15 MG 12 hr tablet Commonly known as:  MS CONTIN Take 1 tablet (15 mg total) by mouth every 12 (twelve) hours.   ondansetron 8 MG tablet Commonly known as:  ZOFRAN Take 8 mg by mouth every 8 (eight) hours as needed for nausea or vomiting.   RESOURCE THICKENUP CLEAR Powd Use as needed   senna-docusate 8.6-50 MG tablet Commonly known as:  Senokot-S Take 2 tablets by mouth 2 (two) times daily.   sucralfate 1 g tablet Commonly known as:  CARAFATE TAKE 1 TABLET BY MOUTH FOUR TIMES DAILY WITH MEALS( 5 MINUTES BEFORE MEALS FOR RADIATION INDLUCED ESOPHAGITIS) AND AT BEDTIME       No Known Allergies  Consultations:  Palliative Care  GI   IR  Oncology    Procedures/Studies: Dg Chest 1 View  Result Date: 08/07/2016 CLINICAL DATA:  Fall. EXAM: CHEST 1  VIEW COMPARISON:  Radiograph of July 30, 2016. FINDINGS: Stable cardiomediastinal silhouette. No pneumothorax is noted. Left lung is clear. Right basilar atelectasis or infiltrate is noted. Minimal right pleural effusion is noted. Right hilar prominence is noted concerning for adenopathy. Bony thorax is unremarkable. IMPRESSION: Right hilar prominence consistent with adenopathy. Right basilar atelectasis or infiltrate is noted with minimal right pleural effusion. Electronically Signed   By: Marijo Conception, M.D.   On: 08/07/2016 11:30   Dg Esophagus  Result Date: 08/25/2016 CLINICAL DATA:  Dysphagia and painful swallowing for several days, metastatic lung cancer. EXAM: ESOPHOGRAM/BARIUM SWALLOW TECHNIQUE: Single contrast examination was performed using  thin barium. FLUOROSCOPY TIME:  Fluoroscopy Time:  0 minutes 24 seconds Radiation Exposure Index (if provided by the fluoroscopic device): 2.1 mGy Number of Acquired Spot Images: 0 COMPARISON:  None. FINDINGS: A very limited examination was performed in the recumbent  LPO position. Patient was able to manage 2 swallows of barium before requesting terminate of the exam due to pain. There is suboptimal opacification of the esophagus for diagnostic purposes. IMPRESSION: Suboptimal opacification of the esophagus for diagnostic purposes. Please see above. Electronically Signed   By: Lorin Picket M.D.   On: 08/25/2016 15:53   Ct Hip Left Wo Contrast  Result Date: 08/07/2016 CLINICAL DATA:  [place] at home this morning. Known osseous metastatic disease. EXAM: CT OF THE LEFT HIP WITHOUT CONTRAST TECHNIQUE: Multidetector CT imaging of the left hip was performed according to the standard protocol. Multiplanar CT image reconstructions were also generated. COMPARISON:  PET-CT 07/15/2016 FINDINGS: As demonstrated on the recent PET-CT there are multiple aggressive appearing lytic bone lesions consistent with metastasis. These most notably involving the left iliac bone and the left pubic bone. The right-sided lesions are not included on this study. No pathologic fracture is identified. No hip or proximal femur lesions are identified. No acute hip fracture. No significant intrapelvic abnormalities. No inguinal mass or adenopathy. IMPRESSION: Stable lytic bone lesions involving the left pelvis but no acute pathologic fracture and no hip fracture Electronically Signed   By: Marijo Sanes M.D.   On: 08/07/2016 12:48   Dg Chest Port 1 View  Result Date: 08/28/2016 CLINICAL DATA:  Patient with shortness of breath, worsening over time. Prior thoracentesis. EXAM: PORTABLE CHEST 1 VIEW COMPARISON:  Chest radiograph 08/25/2016 FINDINGS: Stable cardiac and mediastinal contours. Persistent moderate right pleural effusion with underlying consolidation. The left lung is clear. Mid thoracic spine degenerative changes. IMPRESSION: Persistent moderate right pleural effusion with underlying opacities. Electronically Signed   By: Lovey Newcomer M.D.   On: 08/28/2016 15:04   Dg Chest Port 1 View  Result  Date: 08/25/2016 CLINICAL DATA:  Lung cancer EXAM: PORTABLE CHEST 1 VIEW COMPARISON:  08/07/2016 FINDINGS: Moderate right pleural effusion has developed. Heterogeneous opacities throughout the right lung have increased. Right hilum is obscured. Heart is stable in appearance. Left lung is clear. IMPRESSION: New moderate right pleural effusion. Increased opacities throughout the right lung. Electronically Signed   By: Marybelle Killings M.D.   On: 08/25/2016 15:14   Dg Shoulder Left  Result Date: 08/07/2016 CLINICAL DATA:  Left shoulder pain after fall. EXAM: LEFT SHOULDER - 2+ VIEW COMPARISON:  None. FINDINGS: Mildly displaced left second rib fracture is noted. The acromioclavicular and glenohumeral joints appear intact. No dislocation is noted. IMPRESSION: Mildly displaced left second rib fracture. No other abnormality seen. Electronically Signed   By: Marijo Conception, M.D.   On: 08/07/2016 11:34   Dg  Swallowing Func-speech Pathology  Result Date: 08/30/2016 Objective Swallowing Evaluation: Type of Study: MBS-Modified Barium Swallow Study Patient Details Name: Warren Odonnell MRN: [id number] Date of Birth: 1945-08-02 Today's Date: 08/30/2016 Time: SLP Start Time (ACUTE ONLY): 0850-SLP Stop Time (ACUTE ONLY): 0919 SLP Time Calculation (min) (ACUTE ONLY): 29 min Past Medical History: Past Medical History: Diagnosis Date . Adenocarcinoma of right lung, stage 4 ([place]) 07/30/2016 . Bone metastases ([place]) 07/23/2016 . Cancer associated pain 07/24/2016 . Encounter for antineoplastic chemotherapy 08/10/2016 . Glaucoma  . Goals of care, counseling/discussion 08/10/2016 . Testicular swelling  . Weight loss 07/24/2016 Past Surgical History: Past Surgical History: Procedure Laterality Date . HERNIA REPAIR  1984 . testicular torsion Left  HPI: 71 y.o. male with medical history significant of stage IV lung cancer with bone mets and CVA diagnosed in Feb 2018, currently is getting chemotherapy and radiation therapy and admitted for  Dysphagia/Odynophagia. Esophagram attempted but limited due to pt report of pain. He and his daughter report coughing and "choking" with attempt to swallow solids and liquids.  Subjective: pt sitting upright in chair, daughter and mother present Assessment / Plan / Recommendation CHL IP CLINICAL IMPRESSIONS 08/30/2016 Clinical Impression Pt with moderate oropharyngeal dysphagia c/b weakness resulting in lingual pumping/delayed oral transiting.  Pharyngeal swallow was mildly weak resulting in mild residuals without pt sensation.  No aspiration/penetration noted.  Use of straw allowed liquid placement posterior oral cavity and allowed more efficient swallow.   Pt did become dyspenic during minimal intake and stated "I'm tired" but agreed to complete MBS.   Given reports of frequent choking/coughing and premature spillage of liquids into pharynx - recommend use of thickener in drinks as needed for maximal comfort.  Dependent on [place], advancing to diet as tolerated may be appropriate.  Will follow up with family for further education for aspiration mitigation strategies.  Adequate nutritional intake will be difficult for pt due to his dysphagia and fatigue.  Educated pt and family to findings/recommendations using video.   SLP Visit Diagnosis Dysphagia, oropharyngeal phase (R13.12) Attention and concentration deficit following -- Frontal lobe and executive function deficit following -- Impact on safety and function Moderate aspiration risk;Risk for inadequate nutrition/hydration   CHL IP TREATMENT RECOMMENDATION 08/29/2016 Treatment Recommendations Defer until completion of intrumental exam   Prognosis 08/30/2016 Prognosis for Safe Diet Advancement Fair Barriers to Reach Goals Other (Comment);Severity of deficits Barriers/Prognosis Comment -- CHL IP DIET RECOMMENDATION 08/30/2016 SLP Diet Recommendations Dysphagia 1 (Puree) solids;Thin liquid Liquid Administration via Straw Medication Administration Crushed with puree  Compensations Slow rate;Small sips/bites Postural Changes Seated upright at 90 degrees;Remain semi-upright after after feeds/meals (Comment)   CHL IP OTHER RECOMMENDATIONS 08/30/2016 Recommended Consults -- Oral Care Recommendations Oral care BID Other Recommendations --   CHL IP FOLLOW UP RECOMMENDATIONS 08/29/2016 Follow up Recommendations Home health SLP;24 hour supervision/assistance   CHL IP FREQUENCY AND DURATION 08/30/2016 Speech Therapy Frequency (ACUTE ONLY) min 1 x/week Treatment Duration 1 week      CHL IP ORAL PHASE 08/30/2016 Oral Phase Impaired Oral - Pudding Teaspoon -- Oral - Pudding Cup -- Oral - Honey Teaspoon -- Oral - Honey Cup -- Oral - Nectar Teaspoon Weak lingual manipulation;Lingual pumping;Reduced posterior propulsion;Delayed oral transit;Decreased bolus cohesion Oral - Nectar Cup -- Oral - Nectar Straw -- Oral - Thin Teaspoon Weak lingual manipulation;Delayed oral transit;Lingual pumping;Other (Comment) Oral - Thin Cup Weak lingual manipulation;Lingual pumping;Reduced posterior propulsion;Delayed oral transit;Decreased bolus cohesion Oral - Thin Straw Weak lingual manipulation;Lingual pumping;Reduced posterior propulsion;Decreased bolus cohesion Oral -  Puree Weak lingual manipulation;Delayed oral transit Oral - Mech Soft -- Oral - Regular Impaired mastication;Weak lingual manipulation;Reduced posterior propulsion;Delayed oral transit Oral - Multi-Consistency -- Oral - Pill -- Oral Phase - Comment --  CHL IP PHARYNGEAL PHASE 08/30/2016 Pharyngeal Phase Impaired Pharyngeal- Pudding Teaspoon -- Pharyngeal -- Pharyngeal- Pudding Cup -- Pharyngeal -- Pharyngeal- Honey Teaspoon -- Pharyngeal -- Pharyngeal- Honey Cup -- Pharyngeal -- Pharyngeal- Nectar Teaspoon Pharyngeal residue - valleculae;Pharyngeal residue - pyriform Pharyngeal -- Pharyngeal- Nectar Cup -- Pharyngeal -- Pharyngeal- Nectar Straw -- Pharyngeal -- Pharyngeal- Thin Teaspoon NT Pharyngeal -- Pharyngeal- Thin Cup Pharyngeal residue -  valleculae;Pharyngeal residue - pyriform Pharyngeal -- Pharyngeal- Thin Straw Pharyngeal residue - pyriform;Pharyngeal residue - valleculae Pharyngeal -- Pharyngeal- Puree WFL Pharyngeal -- Pharyngeal- Mechanical Soft -- Pharyngeal -- Pharyngeal- Regular Pharyngeal residue - valleculae Pharyngeal -- Pharyngeal- Multi-consistency -- Pharyngeal -- Pharyngeal- Pill -- Pharyngeal -- Pharyngeal Comment --  CHL IP CERVICAL ESOPHAGEAL PHASE 08/30/2016 Cervical Esophageal Phase WFL Pudding Teaspoon -- Pudding Cup -- Honey Teaspoon -- Honey Cup -- Nectar Teaspoon -- Nectar Cup -- Nectar Straw -- Thin Teaspoon -- Thin Cup -- Thin Straw -- Puree -- Mechanical Soft -- Regular -- Multi-consistency -- Pill -- Cervical Esophageal Comment -- No flowsheet data found. Luanna Salk, [place] [facility] SLP [phone]              Dg Hip Unilat W Or W/o Pelvis 2-3 Views Left  Result Date: 08/07/2016 CLINICAL DATA:  Left hip and shoulder pain after fall. History of lung cancer and bone metastasis. EXAM: DG HIP (WITH OR WITHOUT PELVIS) 2-3V LEFT COMPARISON:  Head CT 07/29/2016 FINDINGS: Patient has known lucent bone lesions in the pelvis. The lucent lesion involving the left superior pubic ramus is most conspicuous. There may be another lesion along the right iliac wing. No clear evidence for a pathologic fracture. The left hip is located without a fracture. IMPRESSION: No acute bone abnormality to the pelvis or left hip. Lucent lesions throughout the pelvis compatible with metastatic disease. No clear evidence for a pathologic fracture. If there is high clinical concern for a fracture, recommend further characterization with CT. Electronically Signed   By: Markus Daft M.D.   On: 08/07/2016 11:34    Discharge Exam: Vitals:   08/30/16 0830 08/30/16 1100  BP: (!) 130/120 (!) 148/116  Pulse: 98 93  Resp: 14 16  Temp: 98.5 F (36.9 C)    Vitals:   08/29/16 2028 08/30/16 0459 08/30/16 0830 08/30/16 1100  BP: (!) 144/96 (!) 120/101 (!)  130/120 (!) 148/116  Pulse: 73 95 98 93  Resp: '16 13 14 16  '$ Temp: 98.3 F (36.8 C) 98.5 F (36.9 C) 98.5 F (36.9 C)   TempSrc: Axillary Axillary Axillary   SpO2: 98% 100% 94% 95%  Weight:      Height:        General: Patient sleeping    The results of significant diagnostics from this hospitalization (including imaging, microbiology, ancillary and laboratory) are listed below for reference.     Microbiology: Recent Results (from the past 240 hour(s))  TECHNOLOGIST REVIEW     Status: None   Collection Time: 08/24/16  2:15 PM  Result Value Ref Range Status   Technologist Review few helmets  Final     Labs: Basic Metabolic Panel:  Recent Labs Lab 08/24/16 1415 08/26/16 0341 08/27/16 0353 08/28/16 0342 08/29/16 0335  NA 140 142 144 142 141  K 4.9 4.0 3.6 3.9 3.5  CL  --  110 112* 112* 111  CO2 25 22 21* 23 23  GLUCOSE 105 102* 92 114* 112*  BUN 32.0* 28* 31* 28* 25*  CREATININE 0.9 0.85 0.96 0.74 0.75  CALCIUM 9.8 8.9 8.9 8.4* 8.6*   Liver Function Tests:  Recent Labs Lab 08/24/16 1415 08/26/16 0341 08/29/16 0335  AST 35* 36 35  ALT 52 34 28  ALKPHOS 122 78 76  BILITOT 0.77 1.2 1.1  PROT 6.0* 5.4* 5.3*  ALBUMIN 2.5* 2.4* 2.6*   CBC:  Recent Labs Lab 08/27/16 0353 08/28/16 0342 08/28/16 1510 08/29/16 0335 08/30/16 0411  WBC 1.5* 1.1* 1.2* 1.4* 1.9*  NEUTROABS 1.3* 0.9* 1.0* 1.1* 1.5*  HGB 9.8* 8.3* 8.3* 9.1* 8.8*  HCT 29.9* 24.3* 24.9* 26.4* 25.7*  MCV 94.0 91.7 89.6 90.7 90.2  PLT 67* 38* 33* 33* 22*   Urinalysis    Component Value Date/Time   COLORURINE YELLOW 05/09/2016 1107   APPEARANCEUR CLEAR 05/09/2016 1107   LABSPEC 1.017 05/09/2016 1107   PHURINE 7.0 05/09/2016 1107   GLUCOSEU NEGATIVE 05/09/2016 1107   HGBUR NEGATIVE 05/09/2016 1107   BILIRUBINUR NEGATIVE 05/09/2016 1107   KETONESUR 15 (A) 05/09/2016 1107   PROTEINUR < 30 08/19/2016 1342   NITRITE NEGATIVE 05/09/2016 1107   LEUKOCYTESUR TRACE (A) 05/09/2016 1107     Microbiology Recent Results (from the past 240 hour(s))  TECHNOLOGIST REVIEW     Status: None   Collection Time: 08/24/16  2:15 PM  Result Value Ref Range Status   Technologist Review few helmets  Final    Time coordinating discharge: Over 30 minutes  SIGNED:  Chipper Oman, MD  [facility] 08/30/2016, 4:27 PM  Pager please text page via  [email or web address] Password [email or web address]

## 2016-08-30 NOTE — Progress Notes (Signed)
Modified Barium Swallow Progress Note  Patient Details  Name: Warren Odonnell MRN: [id number] Date of Birth: Nov 05, 1945  Today's Date: 08/30/2016  Modified Barium Swallow completed.  Full report located under Chart Review in the Imaging Section.  Brief recommendations include the following:  Clinical Impression  Pt with moderate oropharyngeal dysphagia c/b weakness resulting in lingual pumping/delayed oral transiting.  Pharyngeal swallow was mildly weak resulting in mild residuals without pt sensation.  No aspiration/penetration noted.  Use of straw allowed liquid placement posterior oral cavity and allowed more efficient swallow.   Pt did become dyspenic during minimal intake and stated "I'm tired" but agreed to complete MBS.   Given reports of frequent choking/coughing and premature spillage of liquids into pharynx - recommend use of thickener in drinks as needed for maximal comfort.  Dependent on [place], advancing to diet as tolerated may be appropriate.  Will follow up with family for further education for aspiration mitigation strategies.  Adequate nutritional intake will be difficult for pt due to his dysphagia and fatigue.  Educated pt and family to findings/recommendations using video.     Swallow Evaluation Recommendations       SLP Diet Recommendations: Dysphagia 1 (Puree) solids;Thin liquid   Liquid Administration via: Straw   Medication Administration: Crushed with puree   Supervision: Full assist for feeding;Full supervision/cueing for compensatory strategies   Compensations: Slow rate;Small sips/bites (intermittent dry swallow)   Postural Changes: Seated upright at 90 degrees;Remain semi-upright after after feeds/meals (Comment)   Oral Care Recommendations: Oral care BID        Janett Labella [place], [facility] SLP [phone]

## 2016-08-30 NOTE — Consult Note (Signed)
[facility] room available for Warren Odonnell today. Met with spouse to complete paper work for transfer today at 3:00. Dr. Orpah Melter to assume care.   Please fax discharge summary to [phone].  RN please call report to [phone].  Erling Conte, [place]

## 2016-08-30 NOTE — Progress Notes (Signed)
PT Cancellation Note / Sign off  Patient Details Name: Warren Odonnell MRN: [id number] DOB: 25-Nov-1945   Cancelled Treatment:    Reason Eval/Treat Not Completed: Other (comment) Pt and family wish for comfort care and plan for residential hospice upon d/c.  Please page/reorder if PT can be of further assistance.  Will sign off at this point.   Jospeh Mangel,KATHrine E 08/30/2016, 2:24 PM Carmelia Bake, PT, DPT 08/30/2016 Pager: [phone]

## 2016-08-30 NOTE — Progress Notes (Signed)
CSW assisting with d/c planning. Spouse is in agreement with d/c to [facility] today. [facility] transport is required. Medical necessity form completed. D/C Summary sent to SNF for review. Scripts not required. # for report provided to nsg.  Werner Lean LCSW [phone]

## 2016-08-30 NOTE — Progress Notes (Signed)
Daily Progress Note   Patient Name: Warren Odonnell       Date: 08/30/2016 DOB: 12-21-1945  Age: 71 y.o. 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   Subjective: Remains asleep, shallow rapid breathing. Restless movements at times.  See below  Length of Stay: 5  Current Medications: Scheduled Meds:  . amLODipine  10 mg Oral Daily  . atorvastatin  20 mg Oral q1800  . dexamethasone  2 mg Intravenous Q12H  . dorzolamide  1 drop Both Eyes BID  . fluconazole (DIFLUCAN) IV  100 mg Intravenous Q24H  . metoprolol  2.5 mg Intravenous Q8H  . pantoprazole (PROTONIX) IV  40 mg Intravenous Q12H  . risperiDONE  0.25 mg Oral QHS  . sucralfate  1 g Oral TID WC & HS  . Tbo-filgastrim (GRANIX) SQ  300 mcg Subcutaneous q1800    Continuous Infusions: . dextrose 5 % and 0.45% NaCl 50 mL/hr at 08/29/16 1507    PRN Meds: fentaNYL (SUBLIMAZE) injection, haloperidol lactate, hydrALAZINE, HYDROcodone-acetaminophen, lidocaine, LORazepam, ondansetron **OR** ondansetron (ZOFRAN) IV, RESOURCE THICKENUP CLEAR  Physical Exam         NAD but winces and grimaces at times S1 S2 Shallow clear Trace edema, bruises Abdomen soft Did not awaken this meeting Has foley  Vital Signs: BP (!) (P) 130/120 (BP Location: Right Arm)   Pulse (P) 98   Temp (P) 98.5 F (36.9 C) (Axillary)   Resp (P) 14   Ht '5\' 10"'  (1.778 m)   Wt 68.1 kg (150 lb 3.2 oz)   SpO2 (P) 94%   BMI 21.55 kg/m  SpO2: SpO2: (P) 94 % O2 Device: O2 Device: (P) Not Delivered O2 Flow Rate:     Intake/output summary:  Intake/Output Summary (Last 24 hours) at 08/30/16 1148 Last data filed at 08/30/16 0500  Gross per 24 hour  Intake             1300 ml  Output              400 ml  Net              900 ml  LBM: Last BM Date: 08/29/16 Baseline Weight: Weight: 68.1 kg (150 lb 3.2 oz) Most recent weight: Weight: 68.1 kg (150 lb 3.2 oz)       Palliative Assessment/Data:    Flowsheet Rows     Most Recent Value  Intake Tab  Referral Department  Hospitalist  Unit at Time of Referral  Oncology Unit  Palliative Care Primary Diagnosis  Cancer  Date Notified  08/26/16  Palliative Care Type  New Palliative care  Reason for referral  Clarify Goals of Care  Date of Admission  08/25/16  Date first seen by Palliative Care  08/26/16  # of days Palliative referral response time  0 Day(s)  # of days IP prior to Palliative referral  1  Clinical Assessment  Palliative Performance Scale Score  30%  Pain Max last 24 hours  4  Pain Min Last 24 hours  3  Dyspnea Max Last 24 Hours  4  Dyspnea Min Last 24 hours  3  Psychosocial & Spiritual Assessment  Palliative Care Outcomes  Patient/Family meeting held?  Yes  Who was at the meeting?  patient, wife, daughter, son in law.   Palliative Care Outcomes  Clarified goals of care      Patient Active Problem List   Diagnosis Date Noted  . Dysphagia 08/25/2016  . Esophagitis 08/24/2016  . Chemotherapy induced neutropenia ([place]) 08/24/2016  . Dehydration 08/24/2016  . Goals of care, counseling/discussion 08/10/2016  . Encounter for antineoplastic chemotherapy 08/10/2016  . Hypertension 08/10/2016  . Adenocarcinoma of right lung, stage 4 ([place]) 07/30/2016  . Stroke ([place]) 07/25/2016  . Delirium 07/25/2016  . Lacunar stroke ([place])   . 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 Weight loss 07/24/2016  . Bone metastases ([place]) 07/23/2016    Palliative Care Assessment & Plan   Patient Profile:    Assessment:  stage IV lung cancer Bone  mets Esophagitis PNA R pleural effusion   Recommendations/Plan:   Family meeting: Met with patient's wife, 2 daughters along with Dr Quincy Simmonds and the patient's pastor in the patient's room:  We reviewed the patient's life history, he is a very active gentleman, was very involved in his grand children's lives, is a loving husband and father. We reviewed his medical conditions and serious life limiting illnesses, including his current hospitalization was discussed in detail.   Goals and wishes discussed with family in detail. Individually, both the daughters and then his wife endorsed that the patient would not want to live like this. They wish to offer him comfort and dignity going forward. Wife has been to [facility] in the past. Their pastor encouraged them to consider a more comfort based approach to care and prayed with them.   All questions answered to the best of my ability, appreciate Dr Elza Rafter input and presence. CSW contacted for residential hospice, portable DNR has been signed.  Code Status:    Code Status Orders        Start     Ordered   08/26/16 1056  Do not attempt resuscitation (DNR)  Continuous    Question Answer Comment  In the event of cardiac or respiratory ARREST Do not call a "code blue"   In the event of cardiac or respiratory ARREST Do not perform Intubation, CPR, defibrillation or ACLS   In the event of cardiac or respiratory ARREST Use medication by any route, position, wound care, and other measures to relive pain and suffering. May use oxygen, suction and manual treatment of airway obstruction  as needed for comfort.      08/26/16 1055    Code Status History    Date Active Date Inactive Code Status Order ID Comments User Context   08/25/2016  2:32 PM 08/26/2016 10:55 AM Full Code [id number]  Caren Griffins, MD Inpatient   07/25/2016  2:17 AM 07/28/2016  5:40 PM Full Code [id number]  Edwin Dada, MD Inpatient    Advance Directive Documentation      Most Recent Value  Type of Advance Directive  Healthcare Power of Attorney, Living will  Pre-existing out of facility DNR order (yellow form or pink MOST form)  -  "MOST" Form in Place?  -       Prognosis:   < 2 weeks  Discharge Planning:  Hospice facility  Care plan was discussed with  Patient's wife, 2 daughters, Dr Quincy Simmonds, patient's pastor.   Thank you for allowing the Palliative Medicine Team to assist in the care of this patient.   Time In: 11 Time Out: 11.50 Total Time 50 Prolonged Time Billed  yes       Greater than 50%  of this time was spent counseling and coordinating care related to the above assessment and plan.  Loistine Chance, MD [phone]  Please contact Palliative Medicine Team phone at [phone] for questions and concerns.

## 2016-08-30 NOTE — Progress Notes (Signed)
IR following patient for G-tube placement request made by medical team.  Patient was assessed last week for possible procedure due to radiation-induced odynophagia, but was also found to have neutropenia and thrombocytopenia. During initial consult, wife also expressed hesitance in proceeding with G-tube placement and plan was made for re-evaluation today after further discussion between patient, family, and medical team.  Today PA notes patient was seen by Palliative care team and family has decided to pursue comfort care.  No plans for G-tube at this time.   Brynda Greathouse, MS RD PA-C 12:11 PM

## 2016-08-30 NOTE — Care Management Important Message (Signed)
Important Message  Patient Details  Name: Warren Odonnell MRN: [id number] Date of Birth: 05/14/1946   Medicare Important Message Given:  Yes    Kerin Salen 08/30/2016, 10:52 AM

## 2016-08-30 NOTE — Progress Notes (Signed)
CSW consulted for Residential Hospice Home . Pt / family have chosen [facility]. Jenene Slicker from [facility] contacted and referral provided. Harmon Pier will contact CSW when more info is available. CSW will continue to follow to assist with d/c planning to hospice home.  Werner Lean LCSW [phone]

## 2016-08-30 NOTE — Progress Notes (Signed)
CRITICAL VALUE ALERT  Critical value received:  Platelets 22  Date of notification: 08/30/16  Time of notification: 0515  Critical value read back: yes  Nurse who received alert: Oletha Cruel Rn  MD notified (1st page) K. Schorr  Time of first page:  [phone]  MD notified (2nd page): K.Schorr  Time of second [id number]  Responding MD:  Paged twice no response  Time MD responded: [phone]

## 2016-09-01 ENCOUNTER — Inpatient Hospital Stay (HOSPITAL_COMMUNITY): Admission: RE | Admit: 2016-09-01 | Payer: Medicare Other | Source: Ambulatory Visit

## 2016-09-01 ENCOUNTER — Ambulatory Visit (HOSPITAL_COMMUNITY): Payer: Medicare Other

## 2016-09-02 ENCOUNTER — Other Ambulatory Visit: Payer: Medicare Other

## 2016-09-05 DEATH — deceased

## 2016-09-09 ENCOUNTER — Ambulatory Visit: Payer: Medicare Other

## 2016-09-09 ENCOUNTER — Other Ambulatory Visit: Payer: Medicare Other

## 2016-09-15 ENCOUNTER — Encounter: Payer: Self-pay | Admitting: *Deleted

## 2016-09-15 ENCOUNTER — Inpatient Hospital Stay
Admission: RE | Admit: 2016-09-15 | Discharge: 2016-09-15 | Disposition: A | Discharge status: Home / Self Care | Payer: Self-pay | Source: Ambulatory Visit | Attending: Radiation Oncology | Admitting: Radiation Oncology

## 2016-09-15 NOTE — Progress Notes (Signed)
1420 Called in reference to the 1400 follow up appointment.  No answer or answering machine to leave a message.  Saw in the 08-25-16 discharge note Warren Odonnell is under hospice care.

## 2016-09-16 ENCOUNTER — Other Ambulatory Visit: Payer: Medicare Other

## 2016-09-23 ENCOUNTER — Other Ambulatory Visit: Payer: Medicare Other

## 2016-09-30 ENCOUNTER — Ambulatory Visit: Payer: Medicare Other

## 2016-09-30 ENCOUNTER — Other Ambulatory Visit: Payer: Medicare Other

## 2017-03-18 ENCOUNTER — Other Ambulatory Visit: Payer: Self-pay | Admitting: Nurse Practitioner

## 2017-09-01 IMAGING — DX DG CHEST 1V PORT
1 series · 1 of 1 positions shown · non-contrast
Comparison: 08/07/2016

CLINICAL DATA: Lung cancer

EXAM:
PORTABLE CHEST 1 VIEW

[chest ap]
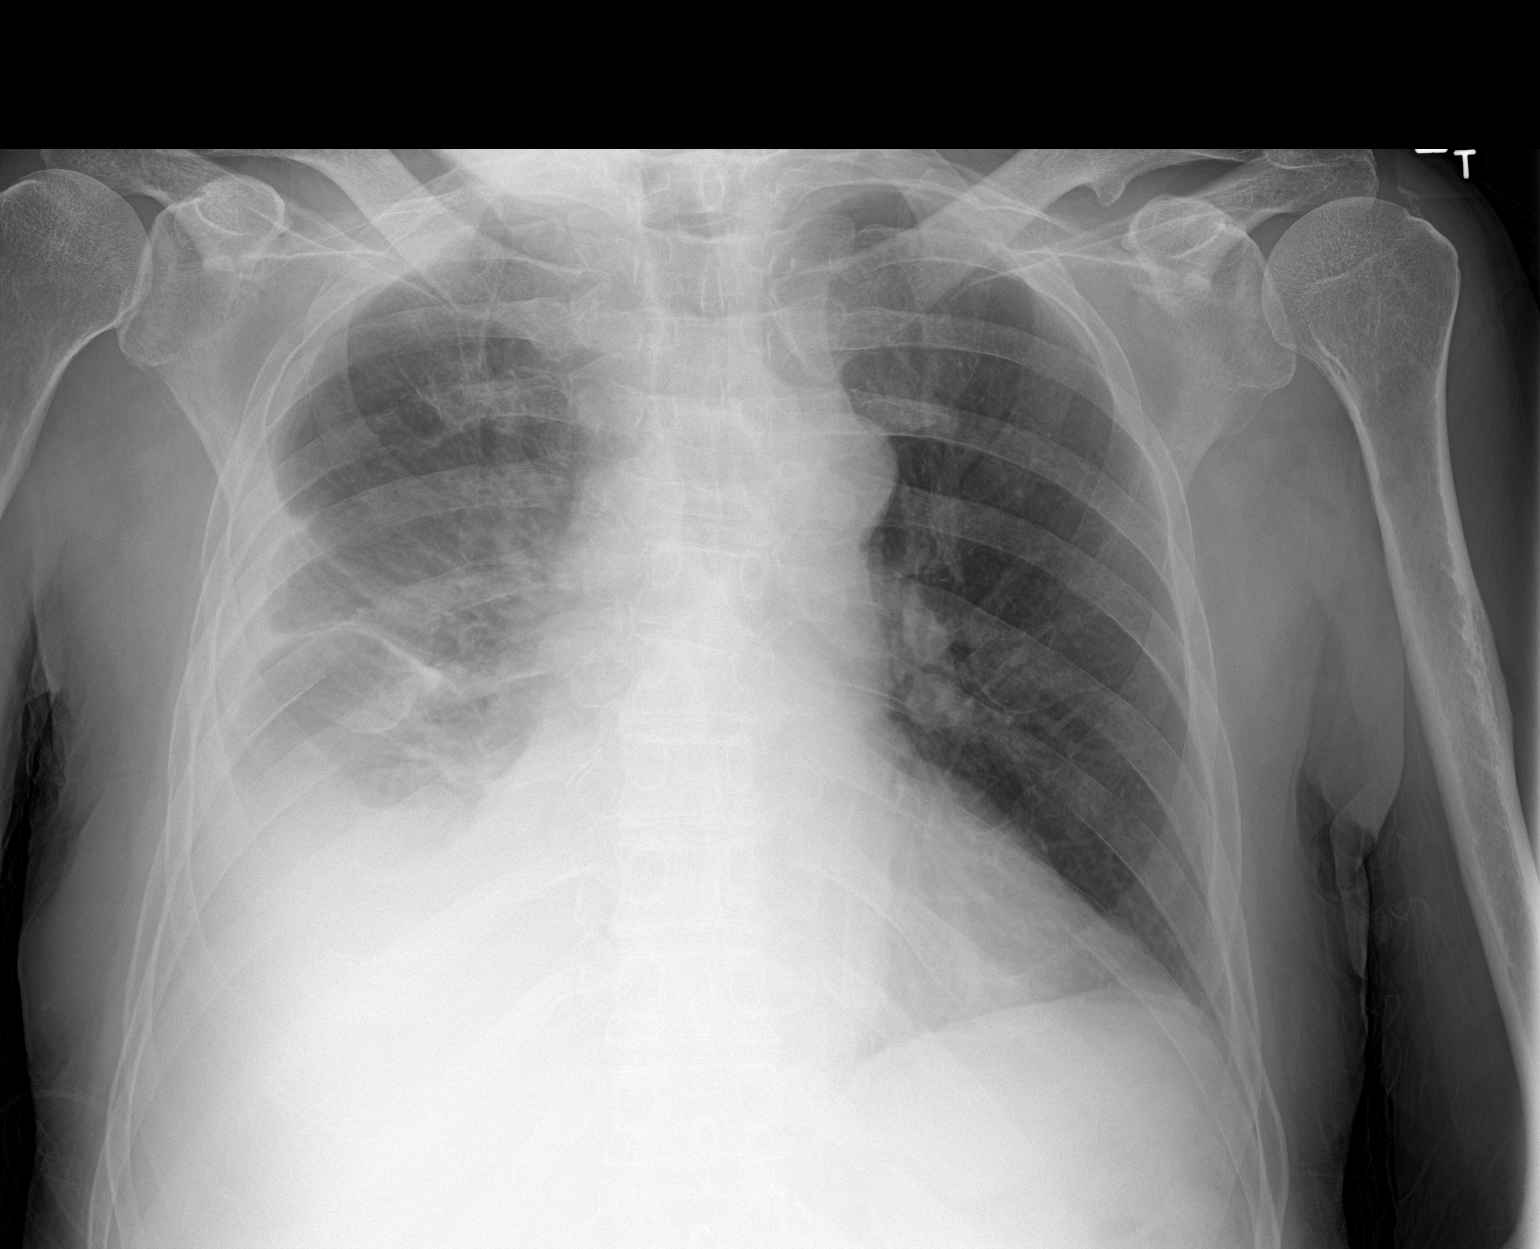

[1 of 1 positions shown; findings below may reference images not displayed]

FINDINGS: Moderate right pleural effusion has developed. Heterogeneous
opacities throughout the right lung have increased. Right hilum is
obscured. Heart is stable in appearance. Left lung is clear.
IMPRESSION: New moderate right pleural effusion.

Increased opacities throughout the right lung.

## 2017-09-01 IMAGING — RF DG ESOPHAGUS
3 series · 11 of 11 positions shown · non-contrast
Comparison: None.

CLINICAL DATA: Dysphagia and painful swallowing for several days,
metastatic lung cancer.

EXAM:
ESOPHOGRAM/BARIUM SWALLOW
TECHNIQUE: Single contrast examination was performed using  thin barium.
FLUOROSCOPY TIME:  Fluoroscopy Time:  0 minutes 24 seconds
Radiation Exposure Index (if provided by the fluoroscopic device):
2.1 mGy
Number of Acquired Spot Images: 0

[Series 1: cp_standard · 0.56mm/px · 3 of 6 frames shown (1 of 3)]
[frame 1/6]
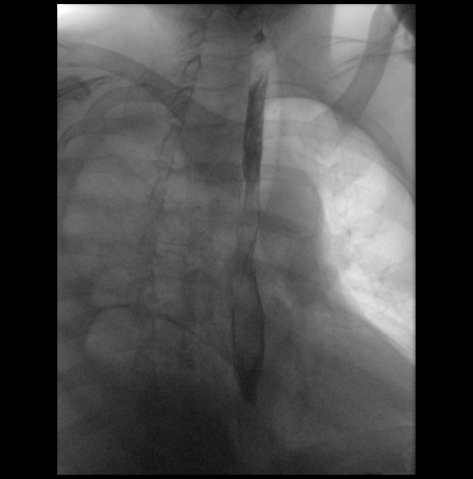
[frame 4/6]
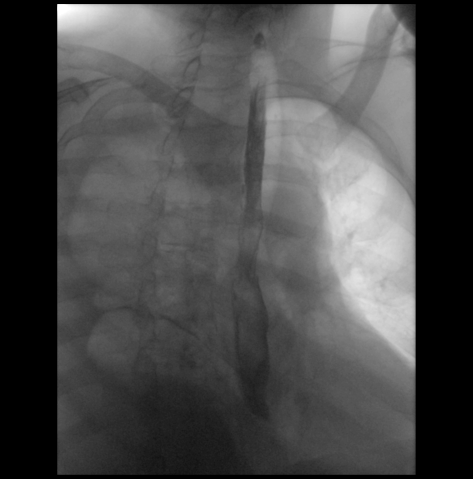
[frame 6/6]
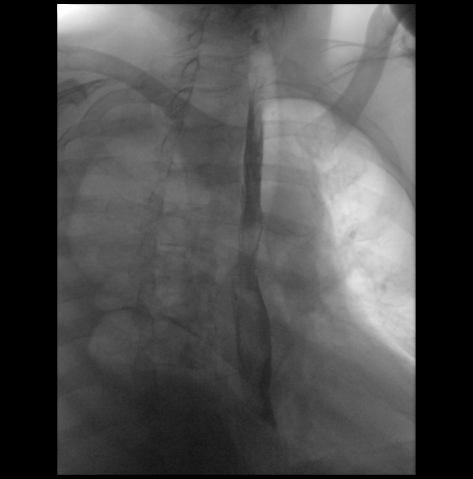

[Series 2: cp_standard · 0.57mm/px · 4 of 22 frames shown (2 of 3)]
[frame 4/22]
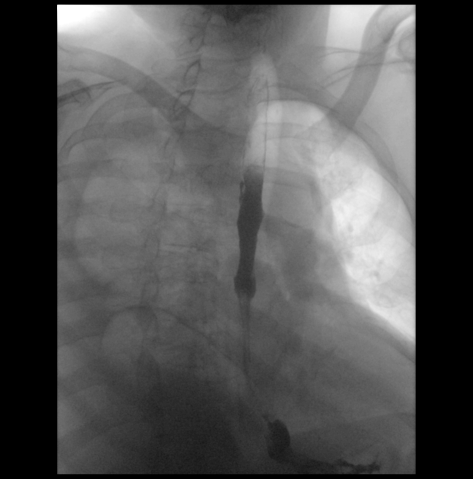
[frame 12/22]
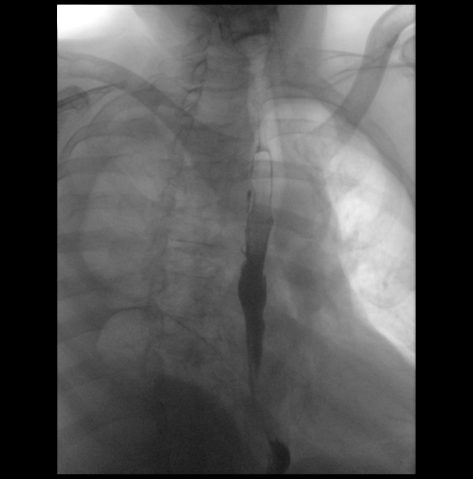
[frame 14/22]
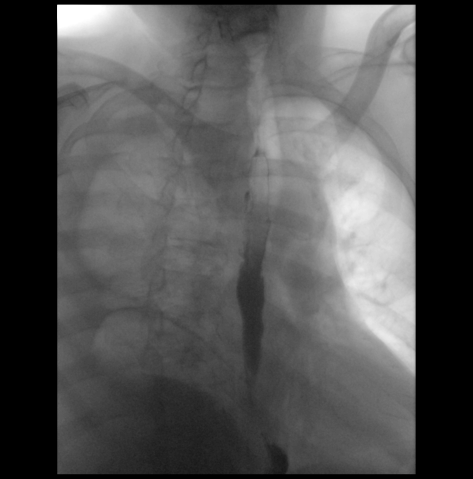
[frame 19/22]
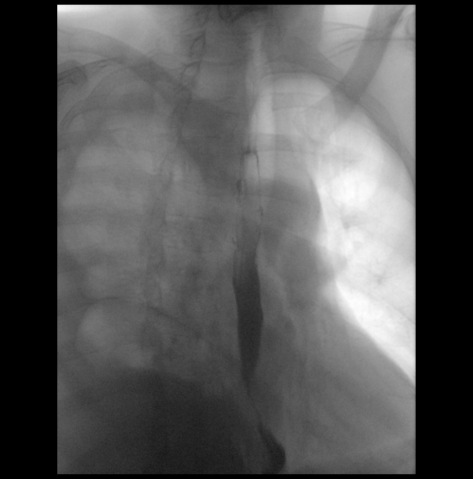

[Series 3: cp_standard · 0.57mm/px · 4 of 15 frames shown (3 of 3)]
[frame 3/15]
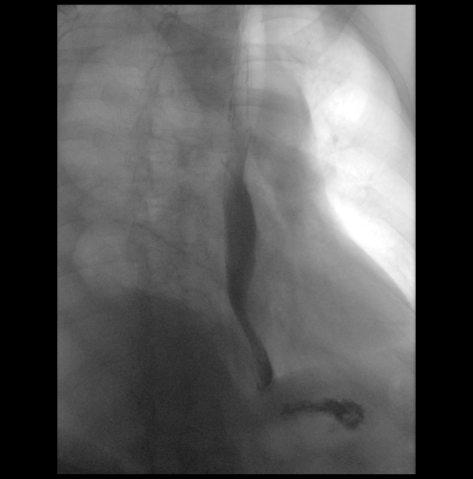
[frame 8/15]
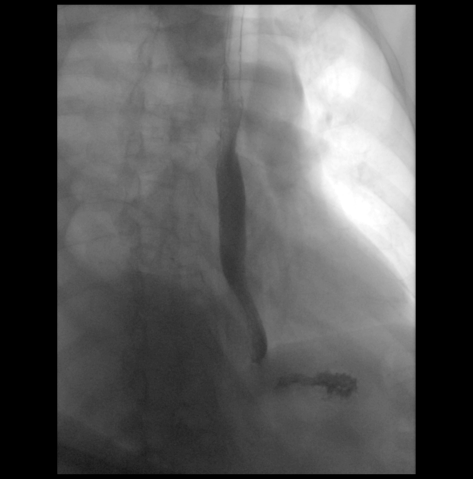
[frame 13/15]
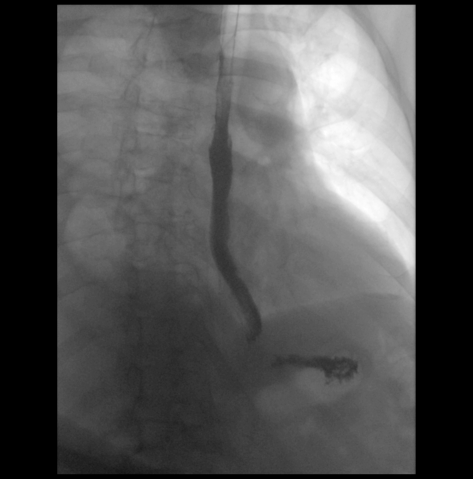
[frame 15/15]
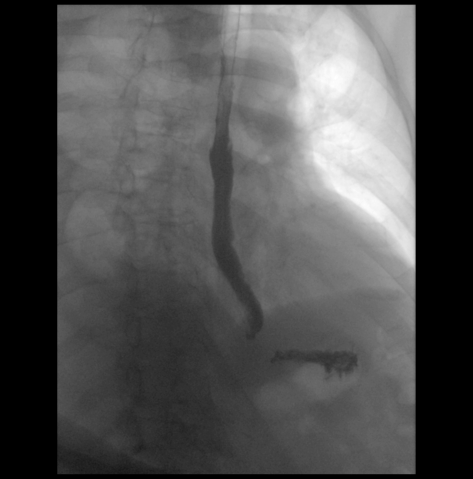

[11 of 11 positions shown; findings below may reference images not displayed]

FINDINGS: A very limited examination was performed in the recumbent LPO
position. Patient was able to manage 2 swallows of barium before
requesting terminate of the exam due to pain. There is suboptimal
opacification of the esophagus for diagnostic purposes.
IMPRESSION: Suboptimal opacification of the esophagus for diagnostic purposes.
Please see above.

## 2017-09-04 IMAGING — DX DG CHEST 1V PORT
1 series · 1 of 1 positions shown · non-contrast
Comparison: Chest radiograph 08/25/2016

CLINICAL DATA: Patient with shortness of breath, worsening over
time. Prior thoracentesis.

EXAM:
PORTABLE CHEST 1 VIEW

[chest ap]
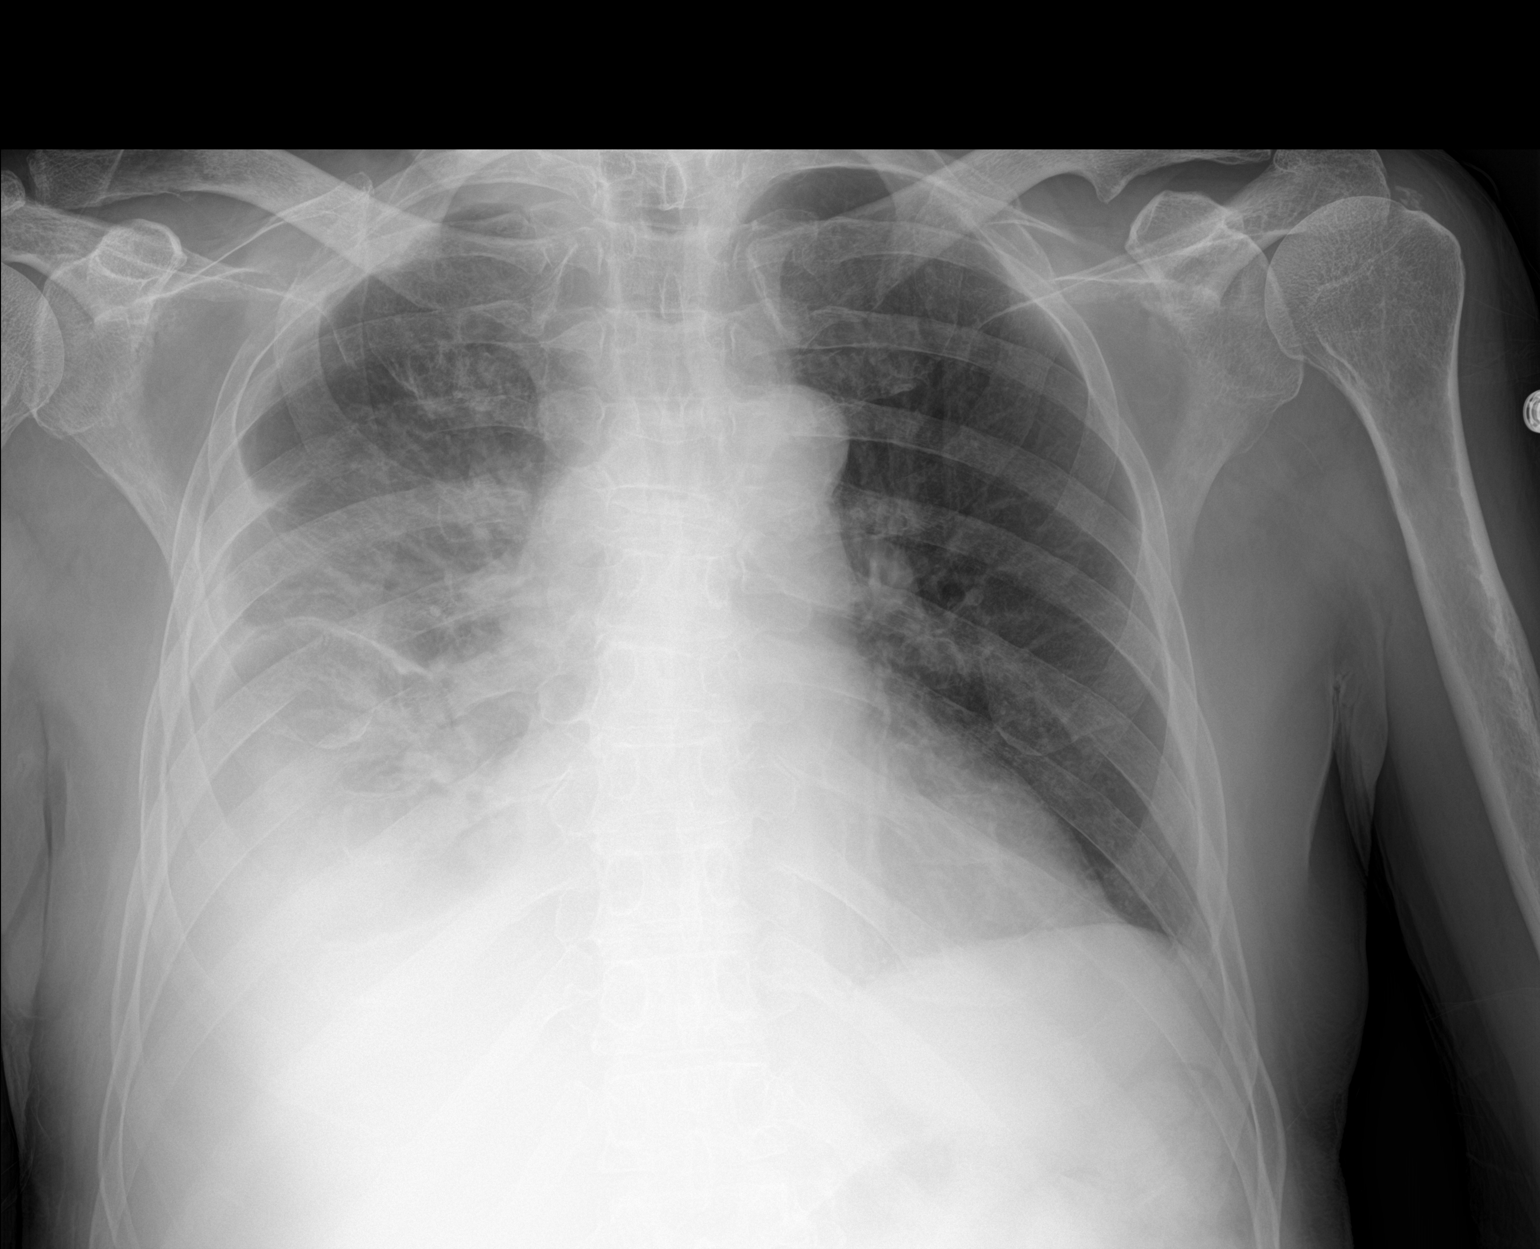

[1 of 1 positions shown; findings below may reference images not displayed]

FINDINGS: Stable cardiac and mediastinal contours. Persistent moderate right
pleural effusion with underlying consolidation. The left lung is
clear. Mid thoracic spine degenerative changes.
IMPRESSION: Persistent moderate right pleural effusion with underlying
opacities.
